# Patient Record
Sex: Female | Born: 1935 | Race: White | Hispanic: No | Smoking: Former smoker
Health system: Southern US, Community
[De-identification: ages and names within clinical notes are randomized; demographics above are authoritative.]

## PROBLEM LIST (undated history)

## (undated) DIAGNOSIS — K5730 Diverticulosis of large intestine without perforation or abscess without bleeding: Secondary | ICD-10-CM

## (undated) DIAGNOSIS — C50919 Malignant neoplasm of unspecified site of unspecified female breast: Secondary | ICD-10-CM

## (undated) DIAGNOSIS — C801 Malignant (primary) neoplasm, unspecified: Secondary | ICD-10-CM

## (undated) DIAGNOSIS — H3530 Unspecified macular degeneration: Secondary | ICD-10-CM

## (undated) DIAGNOSIS — I2510 Atherosclerotic heart disease of native coronary artery without angina pectoris: Secondary | ICD-10-CM

## (undated) HISTORY — DX: Atherosclerotic heart disease of native coronary artery without angina pectoris: I25.10

## (undated) HISTORY — DX: Malignant (primary) neoplasm, unspecified: C80.1

## (undated) HISTORY — DX: Unspecified macular degeneration: H35.30

## (undated) HISTORY — PX: ABDOMINAL HYSTERECTOMY: SHX81

## (undated) HISTORY — PX: MASTECTOMY: SHX3

## (undated) HISTORY — DX: Diverticulosis of large intestine without perforation or abscess without bleeding: K57.30

## (undated) HISTORY — DX: Malignant neoplasm of unspecified site of unspecified female breast: C50.919

## (undated) HISTORY — PX: OTHER SURGICAL HISTORY: SHX169

## (undated) HISTORY — PX: BREAST LUMPECTOMY: SHX2

---

## 1998-05-06 ENCOUNTER — Encounter: Payer: Self-pay | Admitting: Internal Medicine

## 1998-05-06 ENCOUNTER — Ambulatory Visit (HOSPITAL_COMMUNITY): Admission: RE | Admit: 1998-05-06 | Discharge: 1998-05-06 | Payer: Self-pay | Admitting: Internal Medicine

## 1998-05-24 ENCOUNTER — Encounter: Payer: Self-pay | Admitting: Emergency Medicine

## 1998-05-24 ENCOUNTER — Inpatient Hospital Stay (HOSPITAL_COMMUNITY): Admission: EM | Admit: 1998-05-24 | Discharge: 1998-05-27 | Payer: Self-pay | Admitting: Emergency Medicine

## 1998-05-25 ENCOUNTER — Encounter: Payer: Self-pay | Admitting: Internal Medicine

## 2002-02-06 ENCOUNTER — Ambulatory Visit (HOSPITAL_COMMUNITY): Admission: RE | Admit: 2002-02-06 | Discharge: 2002-02-06 | Payer: Self-pay | Admitting: Internal Medicine

## 2002-02-06 ENCOUNTER — Encounter (INDEPENDENT_AMBULATORY_CARE_PROVIDER_SITE_OTHER): Payer: Self-pay | Admitting: Specialist

## 2002-02-06 LAB — HM COLONOSCOPY

## 2002-08-21 ENCOUNTER — Other Ambulatory Visit: Admission: RE | Admit: 2002-08-21 | Discharge: 2002-08-21 | Payer: Self-pay | Admitting: Obstetrics and Gynecology

## 2002-09-17 ENCOUNTER — Encounter: Admission: RE | Admit: 2002-09-17 | Discharge: 2002-09-17 | Payer: Self-pay | Admitting: Obstetrics and Gynecology

## 2002-09-17 ENCOUNTER — Encounter (INDEPENDENT_AMBULATORY_CARE_PROVIDER_SITE_OTHER): Payer: Self-pay | Admitting: Radiology

## 2002-09-17 ENCOUNTER — Other Ambulatory Visit: Admission: RE | Admit: 2002-09-17 | Discharge: 2002-09-17 | Payer: Self-pay | Admitting: Radiology

## 2002-09-17 ENCOUNTER — Encounter: Payer: Self-pay | Admitting: Obstetrics and Gynecology

## 2002-10-15 ENCOUNTER — Ambulatory Visit (HOSPITAL_COMMUNITY): Admission: RE | Admit: 2002-10-15 | Discharge: 2002-10-15 | Payer: Self-pay | Admitting: Obstetrics and Gynecology

## 2002-10-15 ENCOUNTER — Encounter (INDEPENDENT_AMBULATORY_CARE_PROVIDER_SITE_OTHER): Payer: Self-pay | Admitting: *Deleted

## 2002-11-04 ENCOUNTER — Ambulatory Visit: Admission: RE | Admit: 2002-11-04 | Discharge: 2002-11-04 | Payer: Self-pay | Admitting: Gynecology

## 2002-11-24 ENCOUNTER — Ambulatory Visit: Admission: RE | Admit: 2002-11-24 | Discharge: 2003-01-28 | Payer: Self-pay | Admitting: Radiation Oncology

## 2003-02-25 ENCOUNTER — Ambulatory Visit: Admission: RE | Admit: 2003-02-25 | Discharge: 2003-02-25 | Payer: Self-pay | Admitting: Radiation Oncology

## 2003-05-26 ENCOUNTER — Encounter (INDEPENDENT_AMBULATORY_CARE_PROVIDER_SITE_OTHER): Payer: Self-pay | Admitting: Specialist

## 2003-05-26 ENCOUNTER — Ambulatory Visit (HOSPITAL_COMMUNITY): Admission: RE | Admit: 2003-05-26 | Discharge: 2003-05-26 | Payer: Self-pay | Admitting: Obstetrics and Gynecology

## 2003-08-12 ENCOUNTER — Ambulatory Visit: Admission: RE | Admit: 2003-08-12 | Discharge: 2003-08-12 | Payer: Self-pay | Admitting: Radiation Oncology

## 2003-08-17 ENCOUNTER — Ambulatory Visit (HOSPITAL_COMMUNITY): Admission: RE | Admit: 2003-08-17 | Discharge: 2003-08-17 | Payer: Self-pay | Admitting: Orthopedic Surgery

## 2004-02-07 ENCOUNTER — Ambulatory Visit (HOSPITAL_COMMUNITY): Admission: RE | Admit: 2004-02-07 | Discharge: 2004-02-07 | Payer: Self-pay | Admitting: Internal Medicine

## 2004-04-13 ENCOUNTER — Ambulatory Visit: Admission: RE | Admit: 2004-04-13 | Discharge: 2004-04-13 | Payer: Self-pay | Admitting: Radiation Oncology

## 2004-04-20 ENCOUNTER — Ambulatory Visit: Admission: RE | Admit: 2004-04-20 | Discharge: 2004-04-20 | Payer: Self-pay | Admitting: Radiation Oncology

## 2004-05-26 ENCOUNTER — Ambulatory Visit: Payer: Self-pay | Admitting: Internal Medicine

## 2004-10-02 ENCOUNTER — Ambulatory Visit: Payer: Self-pay | Admitting: Internal Medicine

## 2005-01-11 ENCOUNTER — Other Ambulatory Visit: Admission: RE | Admit: 2005-01-11 | Discharge: 2005-01-11 | Payer: Self-pay | Admitting: Obstetrics and Gynecology

## 2005-02-22 ENCOUNTER — Ambulatory Visit: Payer: Self-pay | Admitting: Internal Medicine

## 2005-02-28 ENCOUNTER — Encounter: Admission: RE | Admit: 2005-02-28 | Discharge: 2005-02-28 | Payer: Self-pay | Admitting: Internal Medicine

## 2005-03-02 ENCOUNTER — Ambulatory Visit: Payer: Self-pay | Admitting: Internal Medicine

## 2005-03-08 ENCOUNTER — Ambulatory Visit: Payer: Self-pay | Admitting: Internal Medicine

## 2005-03-08 ENCOUNTER — Ambulatory Visit (HOSPITAL_COMMUNITY): Admission: RE | Admit: 2005-03-08 | Discharge: 2005-03-08 | Payer: Self-pay | Admitting: Internal Medicine

## 2005-03-14 ENCOUNTER — Ambulatory Visit (HOSPITAL_COMMUNITY): Admission: RE | Admit: 2005-03-14 | Discharge: 2005-03-14 | Payer: Self-pay | Admitting: Internal Medicine

## 2005-03-19 ENCOUNTER — Ambulatory Visit: Payer: Self-pay | Admitting: Internal Medicine

## 2005-03-23 ENCOUNTER — Encounter: Payer: Self-pay | Admitting: Interventional Radiology

## 2005-03-27 ENCOUNTER — Ambulatory Visit (HOSPITAL_COMMUNITY): Admission: RE | Admit: 2005-03-27 | Discharge: 2005-03-27 | Payer: Self-pay | Admitting: Interventional Radiology

## 2005-03-27 ENCOUNTER — Encounter (INDEPENDENT_AMBULATORY_CARE_PROVIDER_SITE_OTHER): Payer: Self-pay | Admitting: *Deleted

## 2005-04-10 ENCOUNTER — Encounter: Payer: Self-pay | Admitting: Interventional Radiology

## 2005-04-20 ENCOUNTER — Ambulatory Visit: Payer: Self-pay | Admitting: Internal Medicine

## 2005-05-22 ENCOUNTER — Ambulatory Visit: Payer: Self-pay | Admitting: Internal Medicine

## 2005-05-30 ENCOUNTER — Ambulatory Visit (HOSPITAL_COMMUNITY): Admission: RE | Admit: 2005-05-30 | Discharge: 2005-05-30 | Payer: Self-pay | Admitting: Internal Medicine

## 2005-06-28 ENCOUNTER — Ambulatory Visit: Payer: Self-pay | Admitting: Internal Medicine

## 2005-07-09 ENCOUNTER — Encounter: Admission: RE | Admit: 2005-07-09 | Discharge: 2005-08-05 | Payer: Self-pay | Admitting: Internal Medicine

## 2005-07-23 ENCOUNTER — Ambulatory Visit: Payer: Self-pay | Admitting: Internal Medicine

## 2005-08-06 ENCOUNTER — Encounter: Admission: RE | Admit: 2005-08-06 | Discharge: 2005-09-07 | Payer: Self-pay | Admitting: Internal Medicine

## 2005-09-17 ENCOUNTER — Ambulatory Visit: Payer: Self-pay | Admitting: Internal Medicine

## 2005-10-19 ENCOUNTER — Ambulatory Visit: Payer: Self-pay | Admitting: Internal Medicine

## 2005-10-20 ENCOUNTER — Ambulatory Visit: Payer: Self-pay | Admitting: Internal Medicine

## 2006-02-08 ENCOUNTER — Ambulatory Visit: Payer: Self-pay | Admitting: Internal Medicine

## 2006-03-28 ENCOUNTER — Ambulatory Visit: Payer: Self-pay | Admitting: Oncology

## 2006-04-03 LAB — COMPREHENSIVE METABOLIC PANEL
Albumin: 4.5 g/dL (ref 3.5–5.2)
Alkaline Phosphatase: 69 U/L (ref 39–117)
BUN: 16 mg/dL (ref 6–23)
CO2: 24 mEq/L (ref 19–32)
Glucose, Bld: 97 mg/dL (ref 70–99)
Potassium: 4.2 mEq/L (ref 3.5–5.3)
Sodium: 146 mEq/L — ABNORMAL HIGH (ref 135–145)
Total Bilirubin: 0.4 mg/dL (ref 0.3–1.2)
Total Protein: 7.1 g/dL (ref 6.0–8.3)

## 2006-04-03 LAB — CBC WITH DIFFERENTIAL/PLATELET
Basophils Absolute: 0 10*3/uL (ref 0.0–0.1)
Eosinophils Absolute: 0.2 10*3/uL (ref 0.0–0.5)
HCT: 43.3 % (ref 34.8–46.6)
HGB: 14.4 g/dL (ref 11.6–15.9)
MONO#: 0.5 10*3/uL (ref 0.1–0.9)
NEUT#: 2.7 10*3/uL (ref 1.5–6.5)
NEUT%: 63.3 % (ref 39.6–76.8)
RDW: 14.3 % (ref 11.3–14.5)
WBC: 4.3 10*3/uL (ref 3.9–10.0)
lymph#: 0.9 10*3/uL (ref 0.9–3.3)

## 2006-04-03 LAB — LACTATE DEHYDROGENASE: LDH: 205 U/L (ref 94–250)

## 2006-04-03 LAB — CANCER ANTIGEN 27.29: CA 27.29: 14 U/mL (ref 0–39)

## 2006-04-12 ENCOUNTER — Ambulatory Visit: Payer: Self-pay | Admitting: Internal Medicine

## 2006-09-19 ENCOUNTER — Ambulatory Visit: Payer: Self-pay | Admitting: Oncology

## 2006-09-19 ENCOUNTER — Ambulatory Visit: Payer: Self-pay | Admitting: Internal Medicine

## 2006-09-24 ENCOUNTER — Ambulatory Visit (HOSPITAL_COMMUNITY): Admission: RE | Admit: 2006-09-24 | Discharge: 2006-09-24 | Payer: Self-pay | Admitting: Oncology

## 2006-09-24 LAB — COMPREHENSIVE METABOLIC PANEL
ALT: 14 U/L (ref 0–35)
AST: 25 U/L (ref 0–37)
Albumin: 4.5 g/dL (ref 3.5–5.2)
CO2: 26 mEq/L (ref 19–32)
Calcium: 10.2 mg/dL (ref 8.4–10.5)
Chloride: 106 mEq/L (ref 96–112)
Potassium: 4.2 mEq/L (ref 3.5–5.3)
Sodium: 143 mEq/L (ref 135–145)
Total Protein: 7.1 g/dL (ref 6.0–8.3)

## 2006-09-24 LAB — CBC WITH DIFFERENTIAL/PLATELET
BASO%: 0.3 % (ref 0.0–2.0)
EOS%: 3 % (ref 0.0–7.0)
MCH: 30.3 pg (ref 26.0–34.0)
MCHC: 34.8 g/dL (ref 32.0–36.0)
MONO#: 0.5 10*3/uL (ref 0.1–0.9)
RDW: 14.1 % (ref 11.3–14.5)
WBC: 4.7 10*3/uL (ref 3.9–10.0)
lymph#: 0.9 10*3/uL (ref 0.9–3.3)

## 2006-11-14 ENCOUNTER — Encounter: Payer: Self-pay | Admitting: Internal Medicine

## 2006-11-14 DIAGNOSIS — M1909 Primary osteoarthritis, other specified site: Secondary | ICD-10-CM | POA: Insufficient documentation

## 2006-11-14 DIAGNOSIS — C519 Malignant neoplasm of vulva, unspecified: Secondary | ICD-10-CM | POA: Insufficient documentation

## 2006-11-14 DIAGNOSIS — C4499 Other specified malignant neoplasm of skin, unspecified: Secondary | ICD-10-CM

## 2006-11-14 DIAGNOSIS — K219 Gastro-esophageal reflux disease without esophagitis: Secondary | ICD-10-CM | POA: Insufficient documentation

## 2006-11-14 DIAGNOSIS — M1991 Primary osteoarthritis, unspecified site: Secondary | ICD-10-CM

## 2006-11-25 ENCOUNTER — Ambulatory Visit: Payer: Self-pay | Admitting: Internal Medicine

## 2006-12-31 ENCOUNTER — Ambulatory Visit: Payer: Self-pay | Admitting: Internal Medicine

## 2006-12-31 ENCOUNTER — Encounter: Payer: Self-pay | Admitting: Internal Medicine

## 2006-12-31 DIAGNOSIS — I2510 Atherosclerotic heart disease of native coronary artery without angina pectoris: Secondary | ICD-10-CM

## 2006-12-31 DIAGNOSIS — K5730 Diverticulosis of large intestine without perforation or abscess without bleeding: Secondary | ICD-10-CM | POA: Insufficient documentation

## 2006-12-31 DIAGNOSIS — M810 Age-related osteoporosis without current pathological fracture: Secondary | ICD-10-CM | POA: Insufficient documentation

## 2007-03-23 ENCOUNTER — Ambulatory Visit: Payer: Self-pay | Admitting: Oncology

## 2007-03-26 ENCOUNTER — Encounter: Payer: Self-pay | Admitting: Internal Medicine

## 2007-07-11 ENCOUNTER — Ambulatory Visit: Payer: Self-pay | Admitting: Oncology

## 2007-07-17 ENCOUNTER — Ambulatory Visit (HOSPITAL_COMMUNITY): Admission: RE | Admit: 2007-07-17 | Discharge: 2007-07-17 | Payer: Self-pay | Admitting: Oncology

## 2007-07-23 ENCOUNTER — Encounter: Payer: Self-pay | Admitting: Internal Medicine

## 2007-07-24 ENCOUNTER — Ambulatory Visit: Admission: RE | Admit: 2007-07-24 | Discharge: 2007-09-11 | Payer: Self-pay | Admitting: Radiation Oncology

## 2007-07-25 ENCOUNTER — Encounter: Payer: Self-pay | Admitting: Internal Medicine

## 2007-08-27 ENCOUNTER — Ambulatory Visit: Payer: Self-pay | Admitting: Oncology

## 2007-08-27 LAB — BASIC METABOLIC PANEL
CO2: 24 mEq/L (ref 19–32)
Calcium: 10.3 mg/dL (ref 8.4–10.5)
Creatinine, Ser: 0.86 mg/dL (ref 0.40–1.20)
Sodium: 143 mEq/L (ref 135–145)

## 2007-09-08 ENCOUNTER — Encounter: Payer: Self-pay | Admitting: Internal Medicine

## 2007-10-14 ENCOUNTER — Ambulatory Visit: Payer: Self-pay | Admitting: Oncology

## 2007-10-15 ENCOUNTER — Encounter: Payer: Self-pay | Admitting: Internal Medicine

## 2007-10-15 LAB — CBC WITH DIFFERENTIAL/PLATELET
Eosinophils Absolute: 0.2 10*3/uL (ref 0.0–0.5)
HCT: 41.9 % (ref 34.8–46.6)
LYMPH%: 19.5 % (ref 14.0–48.0)
MONO#: 0.4 10*3/uL (ref 0.1–0.9)
NEUT#: 2.6 10*3/uL (ref 1.5–6.5)
Platelets: 176 10*3/uL (ref 145–400)
RBC: 4.69 10*6/uL (ref 3.70–5.32)
WBC: 4 10*3/uL (ref 3.9–10.0)
lymph#: 0.8 10*3/uL — ABNORMAL LOW (ref 0.9–3.3)

## 2007-10-15 LAB — COMPREHENSIVE METABOLIC PANEL
Albumin: 4.5 g/dL (ref 3.5–5.2)
CO2: 26 mEq/L (ref 19–32)
Calcium: 10.3 mg/dL (ref 8.4–10.5)
Chloride: 107 mEq/L (ref 96–112)
Glucose, Bld: 82 mg/dL (ref 70–99)
Sodium: 142 mEq/L (ref 135–145)
Total Bilirubin: 0.5 mg/dL (ref 0.3–1.2)
Total Protein: 7 g/dL (ref 6.0–8.3)

## 2007-10-15 LAB — CANCER ANTIGEN 27.29: CA 27.29: 22 U/mL (ref 0–39)

## 2007-10-16 ENCOUNTER — Ambulatory Visit (HOSPITAL_COMMUNITY): Admission: RE | Admit: 2007-10-16 | Discharge: 2007-10-16 | Payer: Self-pay | Admitting: Oncology

## 2007-10-23 ENCOUNTER — Encounter: Payer: Self-pay | Admitting: Internal Medicine

## 2007-12-05 ENCOUNTER — Ambulatory Visit: Payer: Self-pay | Admitting: Oncology

## 2007-12-05 LAB — URINALYSIS, MICROSCOPIC - CHCC
Glucose: NEGATIVE g/dL
Leukocyte Esterase: NEGATIVE
Nitrite: NEGATIVE
Specific Gravity, Urine: 1.005 (ref 1.003–1.035)
WBC, UA: NEGATIVE (ref 0–2)

## 2008-01-12 ENCOUNTER — Telehealth: Payer: Self-pay | Admitting: Internal Medicine

## 2008-01-13 ENCOUNTER — Ambulatory Visit: Payer: Self-pay | Admitting: Internal Medicine

## 2008-01-13 DIAGNOSIS — C50912 Malignant neoplasm of unspecified site of left female breast: Secondary | ICD-10-CM

## 2008-01-13 LAB — CONVERTED CEMR LAB
Bilirubin Urine: NEGATIVE
Hemoglobin, Urine: NEGATIVE
Ketones, ur: NEGATIVE mg/dL
Leukocytes, UA: NEGATIVE
Nitrite: NEGATIVE
Specific Gravity, Urine: <=1.005 (ref 1.000–1.03)
Total Protein, Urine: NEGATIVE mg/dL
Urine Glucose: NEGATIVE mg/dL
Urobilinogen, UA: 0.2 (ref 0.0–1.0)
pH: 7 (ref 5.0–8.0)

## 2008-01-17 ENCOUNTER — Telehealth (INDEPENDENT_AMBULATORY_CARE_PROVIDER_SITE_OTHER): Payer: Self-pay | Admitting: *Deleted

## 2008-01-17 ENCOUNTER — Ambulatory Visit: Payer: Self-pay | Admitting: Family Medicine

## 2008-01-19 ENCOUNTER — Ambulatory Visit: Payer: Self-pay | Admitting: Internal Medicine

## 2008-01-19 LAB — CONVERTED CEMR LAB
ALT: 17 units/L (ref 0–35)
AST: 31 units/L (ref 0–37)
Albumin: 4.5 g/dL (ref 3.5–5.2)
Alkaline Phosphatase: 54 units/L (ref 39–117)
BUN: 12 mg/dL (ref 6–23)
Basophils Absolute: 0 10*3/uL (ref 0.0–0.1)
Basophils Relative: 1 % (ref 0.0–3.0)
Bilirubin, Direct: 0.1 mg/dL (ref 0.0–0.3)
CO2: 29 meq/L (ref 19–32)
Calcium: 10.1 mg/dL (ref 8.4–10.5)
Chloride: 113 meq/L — ABNORMAL HIGH (ref 96–112)
Creatinine, Ser: 0.9 mg/dL (ref 0.4–1.2)
Eosinophils Absolute: 0.2 10*3/uL (ref 0.0–0.7)
Eosinophils Relative: 3.9 % (ref 0.0–5.0)
GFR calc Af Amer: 75 mL/min
GFR calc non Af Amer: 62 mL/min
Glucose, Bld: 86 mg/dL (ref 70–99)
HCT: 43.8 % (ref 36.0–46.0)
Hemoglobin: 15.2 g/dL — ABNORMAL HIGH (ref 12.0–15.0)
Lymphocytes Relative: 19.3 % (ref 12.0–46.0)
MCHC: 34.7 g/dL (ref 30.0–36.0)
MCV: 91.7 fL (ref 78.0–100.0)
Monocytes Absolute: 0.4 10*3/uL (ref 0.1–1.0)
Monocytes Relative: 10.2 % (ref 3.0–12.0)
Neutro Abs: 2.7 10*3/uL (ref 1.4–7.7)
Neutrophils Relative %: 65.6 % (ref 43.0–77.0)
Platelets: 155 10*3/uL (ref 150–400)
Potassium: 4.4 meq/L (ref 3.5–5.1)
RBC: 4.77 M/uL (ref 3.87–5.11)
RDW: 13.4 % (ref 11.5–14.6)
Sodium: 145 meq/L (ref 135–145)
Total Bilirubin: 0.9 mg/dL (ref 0.3–1.2)
Total Protein: 7.9 g/dL (ref 6.0–8.3)
WBC: 4.1 10*3/uL — ABNORMAL LOW (ref 4.5–10.5)

## 2008-01-20 ENCOUNTER — Telehealth: Payer: Self-pay | Admitting: Internal Medicine

## 2008-01-20 ENCOUNTER — Ambulatory Visit: Payer: Self-pay | Admitting: Oncology

## 2008-01-21 ENCOUNTER — Telehealth: Payer: Self-pay | Admitting: Internal Medicine

## 2008-02-26 ENCOUNTER — Ambulatory Visit (HOSPITAL_COMMUNITY): Admission: RE | Admit: 2008-02-26 | Discharge: 2008-02-26 | Payer: Self-pay | Admitting: Oncology

## 2008-02-26 LAB — CBC WITH DIFFERENTIAL/PLATELET
Eosinophils Absolute: 0.2 10*3/uL (ref 0.0–0.5)
HCT: 43 % (ref 34.8–46.6)
HGB: 14.5 g/dL (ref 11.6–15.9)
LYMPH%: 17.7 % (ref 14.0–48.0)
MONO#: 0.4 10*3/uL (ref 0.1–0.9)
NEUT#: 2.6 10*3/uL (ref 1.5–6.5)
Platelets: 204 10*3/uL (ref 145–400)
RBC: 4.72 10*6/uL (ref 3.70–5.32)
WBC: 3.9 10*3/uL (ref 3.9–10.0)

## 2008-02-26 LAB — COMPREHENSIVE METABOLIC PANEL
Albumin: 3.8 g/dL (ref 3.5–5.2)
CO2: 29 mEq/L (ref 19–32)
Glucose, Bld: 96 mg/dL (ref 70–99)
Sodium: 142 mEq/L (ref 135–145)
Total Bilirubin: 0.5 mg/dL (ref 0.3–1.2)
Total Protein: 6.7 g/dL (ref 6.0–8.3)

## 2008-02-26 LAB — CANCER ANTIGEN 27.29: CA 27.29: 18 U/mL (ref 0–39)

## 2008-03-10 ENCOUNTER — Ambulatory Visit: Payer: Self-pay | Admitting: Oncology

## 2008-03-17 ENCOUNTER — Encounter: Payer: Self-pay | Admitting: Internal Medicine

## 2008-03-17 LAB — CBC WITH DIFFERENTIAL/PLATELET
Eosinophils Absolute: 0.2 10*3/uL (ref 0.0–0.5)
MONO#: 0.5 10*3/uL (ref 0.1–0.9)
NEUT#: 3 10*3/uL (ref 1.5–6.5)
RBC: 4.72 10*6/uL (ref 3.70–5.32)
RDW: 12.8 % (ref 11.3–14.5)
WBC: 4.6 10*3/uL (ref 3.9–10.0)
lymph#: 0.9 10*3/uL (ref 0.9–3.3)

## 2008-03-30 ENCOUNTER — Encounter: Payer: Self-pay | Admitting: Internal Medicine

## 2008-07-08 ENCOUNTER — Ambulatory Visit: Payer: Self-pay | Admitting: Oncology

## 2008-07-12 LAB — COMPREHENSIVE METABOLIC PANEL
AST: 26 U/L (ref 0–37)
Albumin: 4.3 g/dL (ref 3.5–5.2)
Alkaline Phosphatase: 51 U/L (ref 39–117)
BUN: 16 mg/dL (ref 6–23)
Creatinine, Ser: 0.86 mg/dL (ref 0.40–1.20)
Potassium: 3.8 mEq/L (ref 3.5–5.3)

## 2008-07-12 LAB — CBC WITH DIFFERENTIAL/PLATELET
Basophils Absolute: 0 10*3/uL (ref 0.0–0.1)
EOS%: 4 % (ref 0.0–7.0)
HGB: 14.4 g/dL (ref 11.6–15.9)
MCH: 30.5 pg (ref 25.1–34.0)
MCV: 90.5 fL (ref 79.5–101.0)
MONO%: 8.5 % (ref 0.0–14.0)
RBC: 4.73 10*6/uL (ref 3.70–5.45)
RDW: 13.6 % (ref 11.2–14.5)

## 2008-07-19 ENCOUNTER — Encounter: Payer: Self-pay | Admitting: Internal Medicine

## 2008-09-03 ENCOUNTER — Telehealth (INDEPENDENT_AMBULATORY_CARE_PROVIDER_SITE_OTHER): Payer: Self-pay | Admitting: *Deleted

## 2008-09-06 ENCOUNTER — Ambulatory Visit: Payer: Self-pay | Admitting: Internal Medicine

## 2008-09-06 DIAGNOSIS — M79609 Pain in unspecified limb: Secondary | ICD-10-CM | POA: Insufficient documentation

## 2008-09-07 ENCOUNTER — Ambulatory Visit: Payer: Self-pay | Admitting: Internal Medicine

## 2008-09-22 ENCOUNTER — Ambulatory Visit: Payer: Self-pay | Admitting: Endocrinology

## 2008-10-11 ENCOUNTER — Encounter: Payer: Self-pay | Admitting: Internal Medicine

## 2008-10-29 LAB — HM MAMMOGRAPHY: HM Mammogram: NORMAL

## 2008-11-29 ENCOUNTER — Encounter: Payer: Self-pay | Admitting: Internal Medicine

## 2009-01-06 ENCOUNTER — Ambulatory Visit: Payer: Self-pay | Admitting: Oncology

## 2009-01-10 LAB — COMPREHENSIVE METABOLIC PANEL
ALT: 11 U/L (ref 0–35)
AST: 24 U/L (ref 0–37)
Albumin: 4.2 g/dL (ref 3.5–5.2)
Calcium: 10 mg/dL (ref 8.4–10.5)
Chloride: 107 mEq/L (ref 96–112)
Potassium: 4 mEq/L (ref 3.5–5.3)
Sodium: 143 mEq/L (ref 135–145)
Total Protein: 6.7 g/dL (ref 6.0–8.3)

## 2009-01-10 LAB — CBC WITH DIFFERENTIAL/PLATELET
BASO%: 0.4 % (ref 0.0–2.0)
EOS%: 4.7 % (ref 0.0–7.0)
MCH: 30.9 pg (ref 25.1–34.0)
MCHC: 34.2 g/dL (ref 31.5–36.0)
RDW: 14.3 % (ref 11.2–14.5)
WBC: 4.6 10*3/uL (ref 3.9–10.3)
lymph#: 1 10*3/uL (ref 0.9–3.3)

## 2009-01-17 ENCOUNTER — Encounter: Payer: Self-pay | Admitting: Internal Medicine

## 2009-02-24 ENCOUNTER — Ambulatory Visit: Payer: Self-pay | Admitting: Internal Medicine

## 2009-03-04 ENCOUNTER — Telehealth: Payer: Self-pay | Admitting: Internal Medicine

## 2009-03-15 ENCOUNTER — Encounter: Payer: Self-pay | Admitting: Internal Medicine

## 2009-04-01 ENCOUNTER — Ambulatory Visit: Payer: Self-pay | Admitting: Oncology

## 2009-04-05 ENCOUNTER — Ambulatory Visit: Admission: RE | Admit: 2009-04-05 | Discharge: 2009-04-29 | Payer: Self-pay | Admitting: Radiation Oncology

## 2009-04-05 ENCOUNTER — Ambulatory Visit (HOSPITAL_COMMUNITY): Admission: RE | Admit: 2009-04-05 | Discharge: 2009-04-05 | Payer: Self-pay | Admitting: Oncology

## 2009-04-05 LAB — COMPREHENSIVE METABOLIC PANEL
ALT: 17 U/L (ref 0–35)
Alkaline Phosphatase: 63 U/L (ref 39–117)
BUN: 18 mg/dL (ref 6–23)
CO2: 29 mEq/L (ref 19–32)
Chloride: 105 mEq/L (ref 96–112)
Creatinine, Ser: 0.95 mg/dL (ref 0.40–1.20)
Glucose, Bld: 91 mg/dL (ref 70–99)
Potassium: 4 mEq/L (ref 3.5–5.3)
Total Bilirubin: 0.9 mg/dL (ref 0.3–1.2)
Total Protein: 7.4 g/dL (ref 6.0–8.3)

## 2009-04-05 LAB — CBC WITH DIFFERENTIAL/PLATELET
Basophils Absolute: 0 10*3/uL (ref 0.0–0.1)
EOS%: 3.5 % (ref 0.0–7.0)
Eosinophils Absolute: 0.2 10*3/uL (ref 0.0–0.5)
HCT: 45 % (ref 34.8–46.6)
HGB: 15.2 g/dL (ref 11.6–15.9)
MCH: 31.5 pg (ref 25.1–34.0)
MONO#: 0.5 10*3/uL (ref 0.1–0.9)
NEUT#: 3.3 10*3/uL (ref 1.5–6.5)
NEUT%: 69.7 % (ref 38.4–76.8)
RDW: 13.4 % (ref 11.2–14.5)
lymph#: 0.8 10*3/uL — ABNORMAL LOW (ref 0.9–3.3)

## 2009-04-06 ENCOUNTER — Encounter: Payer: Self-pay | Admitting: Internal Medicine

## 2009-04-06 LAB — VITAMIN D 25 HYDROXY (VIT D DEFICIENCY, FRACTURES): Vit D, 25-Hydroxy: 52 ng/mL (ref 30–89)

## 2009-04-07 ENCOUNTER — Telehealth: Payer: Self-pay | Admitting: Internal Medicine

## 2009-04-07 ENCOUNTER — Ambulatory Visit: Payer: Self-pay | Admitting: Internal Medicine

## 2009-04-07 DIAGNOSIS — F438 Other reactions to severe stress: Secondary | ICD-10-CM

## 2009-04-07 DIAGNOSIS — I10 Essential (primary) hypertension: Secondary | ICD-10-CM | POA: Insufficient documentation

## 2009-04-07 DIAGNOSIS — F4389 Other reactions to severe stress: Secondary | ICD-10-CM | POA: Insufficient documentation

## 2009-04-11 ENCOUNTER — Encounter: Payer: Self-pay | Admitting: Internal Medicine

## 2009-04-27 ENCOUNTER — Encounter: Payer: Self-pay | Admitting: Oncology

## 2009-04-27 ENCOUNTER — Ambulatory Visit: Payer: Self-pay | Admitting: Cardiovascular Disease

## 2009-04-27 ENCOUNTER — Ambulatory Visit: Admission: RE | Admit: 2009-04-27 | Discharge: 2009-04-27 | Payer: Self-pay | Admitting: Family Medicine

## 2009-05-02 ENCOUNTER — Other Ambulatory Visit: Admission: RE | Admit: 2009-05-02 | Discharge: 2009-05-02 | Payer: Self-pay | Admitting: General Surgery

## 2009-05-02 ENCOUNTER — Ambulatory Visit: Admission: RE | Admit: 2009-05-02 | Discharge: 2009-07-14 | Payer: Self-pay | Admitting: Radiation Oncology

## 2009-05-02 ENCOUNTER — Ambulatory Visit: Payer: Self-pay | Admitting: Oncology

## 2009-05-02 ENCOUNTER — Encounter: Payer: Self-pay | Admitting: Internal Medicine

## 2009-05-02 LAB — CBC WITH DIFFERENTIAL/PLATELET
BASO%: 0.6 % (ref 0.0–2.0)
Basophils Absolute: 0 10*3/uL (ref 0.0–0.1)
EOS%: 4.5 % (ref 0.0–7.0)
Eosinophils Absolute: 0.2 10*3/uL (ref 0.0–0.5)
HCT: 43.6 % (ref 34.8–46.6)
HGB: 14.7 g/dL (ref 11.6–15.9)
LYMPH%: 16.8 % (ref 14.0–49.7)
MCH: 31.3 pg (ref 25.1–34.0)
MCHC: 33.7 g/dL (ref 31.5–36.0)
MCV: 92.8 fL (ref 79.5–101.0)
MONO#: 0.5 10*3/uL (ref 0.1–0.9)
MONO%: 10.3 % (ref 0.0–14.0)
NEUT#: 3.4 10*3/uL (ref 1.5–6.5)
NEUT%: 67.8 % (ref 38.4–76.8)
Platelets: 150 10*3/uL (ref 145–400)
RBC: 4.69 10*6/uL (ref 3.70–5.45)
RDW: 14.2 % (ref 11.2–14.5)
WBC: 5 10*3/uL (ref 3.9–10.3)
lymph#: 0.8 10*3/uL — ABNORMAL LOW (ref 0.9–3.3)

## 2009-05-02 LAB — COMPREHENSIVE METABOLIC PANEL
ALT: 14 U/L (ref 0–35)
AST: 26 U/L (ref 0–37)
Albumin: 4.5 g/dL (ref 3.5–5.2)
Alkaline Phosphatase: 57 U/L (ref 39–117)
BUN: 19 mg/dL (ref 6–23)
Potassium: 4.2 mEq/L (ref 3.5–5.3)
Sodium: 142 mEq/L (ref 135–145)
Total Protein: 6.7 g/dL (ref 6.0–8.3)

## 2009-05-04 ENCOUNTER — Telehealth: Payer: Self-pay | Admitting: Internal Medicine

## 2009-05-11 ENCOUNTER — Encounter: Payer: Self-pay | Admitting: Internal Medicine

## 2009-05-12 ENCOUNTER — Encounter: Payer: Self-pay | Admitting: Internal Medicine

## 2009-05-18 LAB — BASIC METABOLIC PANEL
BUN: 20 mg/dL (ref 6–23)
Creatinine, Ser: 0.96 mg/dL (ref 0.40–1.20)
Glucose, Bld: 89 mg/dL (ref 70–99)
Potassium: 4.2 mEq/L (ref 3.5–5.3)

## 2009-06-02 ENCOUNTER — Telehealth: Payer: Self-pay | Admitting: Internal Medicine

## 2009-06-07 ENCOUNTER — Ambulatory Visit: Payer: Self-pay | Admitting: Oncology

## 2009-06-14 LAB — COMPREHENSIVE METABOLIC PANEL
Albumin: 4.2 g/dL (ref 3.5–5.2)
Alkaline Phosphatase: 44 U/L (ref 39–117)
Glucose, Bld: 124 mg/dL — ABNORMAL HIGH (ref 70–99)
Potassium: 4.3 mEq/L (ref 3.5–5.3)
Sodium: 142 mEq/L (ref 135–145)
Total Protein: 7.1 g/dL (ref 6.0–8.3)

## 2009-06-14 LAB — CBC WITH DIFFERENTIAL/PLATELET
Eosinophils Absolute: 0.3 10*3/uL (ref 0.0–0.5)
MCV: 91.7 fL (ref 79.5–101.0)
MONO#: 0.4 10*3/uL (ref 0.1–0.9)
MONO%: 9.3 % (ref 0.0–14.0)
NEUT#: 3 10*3/uL (ref 1.5–6.5)
RBC: 4.69 10*6/uL (ref 3.70–5.45)
RDW: 14.3 % (ref 11.2–14.5)
WBC: 4.7 10*3/uL (ref 3.9–10.3)
lymph#: 0.9 10*3/uL (ref 0.9–3.3)

## 2009-06-20 ENCOUNTER — Encounter: Payer: Self-pay | Admitting: Internal Medicine

## 2009-06-24 ENCOUNTER — Telehealth: Payer: Self-pay | Admitting: Internal Medicine

## 2009-06-27 ENCOUNTER — Telehealth: Payer: Self-pay | Admitting: Internal Medicine

## 2009-06-29 ENCOUNTER — Encounter: Payer: Self-pay | Admitting: Internal Medicine

## 2009-06-29 LAB — CBC WITH DIFFERENTIAL/PLATELET
BASO%: 0.9 % (ref 0.0–2.0)
LYMPH%: 24 % (ref 14.0–49.7)
MCHC: 32.6 g/dL (ref 31.5–36.0)
MCV: 92.5 fL (ref 79.5–101.0)
MONO#: 0.6 10*3/uL (ref 0.1–0.9)
MONO%: 10.3 % (ref 0.0–14.0)
Platelets: 145 10*3/uL (ref 145–400)
RBC: 4.64 10*6/uL (ref 3.70–5.45)
RDW: 14.4 % (ref 11.2–14.5)
WBC: 5.5 10*3/uL (ref 3.9–10.3)
nRBC: 0 % (ref 0–0)

## 2009-07-13 ENCOUNTER — Encounter: Payer: Self-pay | Admitting: Oncology

## 2009-07-13 ENCOUNTER — Ambulatory Visit: Payer: Self-pay | Admitting: Cardiology

## 2009-07-13 ENCOUNTER — Ambulatory Visit: Admission: RE | Admit: 2009-07-13 | Discharge: 2009-07-13 | Payer: Self-pay | Admitting: Oncology

## 2009-07-18 ENCOUNTER — Ambulatory Visit: Payer: Self-pay | Admitting: Oncology

## 2009-07-20 ENCOUNTER — Encounter: Payer: Self-pay | Admitting: Internal Medicine

## 2009-07-20 LAB — CBC WITH DIFFERENTIAL/PLATELET
BASO%: 0.4 % (ref 0.0–2.0)
EOS%: 5 % (ref 0.0–7.0)
MCH: 30.1 pg (ref 25.1–34.0)
MCHC: 32.7 g/dL (ref 31.5–36.0)
RDW: 14.3 % (ref 11.2–14.5)
WBC: 4.8 10*3/uL (ref 3.9–10.3)
lymph#: 1.3 10*3/uL (ref 0.9–3.3)
nRBC: 0 % (ref 0–0)

## 2009-08-10 ENCOUNTER — Encounter: Payer: Self-pay | Admitting: Internal Medicine

## 2009-08-10 LAB — CBC WITH DIFFERENTIAL/PLATELET
BASO%: 0.9 % (ref 0.0–2.0)
Basophils Absolute: 0.1 10*3/uL (ref 0.0–0.1)
EOS%: 6.6 % (ref 0.0–7.0)
HGB: 14.5 g/dL (ref 11.6–15.9)
MCH: 30.3 pg (ref 25.1–34.0)
MCHC: 32.9 g/dL (ref 31.5–36.0)
MCV: 92.3 fL (ref 79.5–101.0)
MONO%: 13.1 % (ref 0.0–14.0)
RDW: 14.7 % — ABNORMAL HIGH (ref 11.2–14.5)
lymph#: 1.2 10*3/uL (ref 0.9–3.3)

## 2009-08-10 LAB — BASIC METABOLIC PANEL
BUN: 23 mg/dL (ref 6–23)
CO2: 28 mEq/L (ref 19–32)
Calcium: 11 mg/dL — ABNORMAL HIGH (ref 8.4–10.5)
Chloride: 101 mEq/L (ref 96–112)
Creatinine, Ser: 1.17 mg/dL (ref 0.40–1.20)
Glucose, Bld: 83 mg/dL (ref 70–99)

## 2009-08-30 ENCOUNTER — Ambulatory Visit: Payer: Self-pay | Admitting: Oncology

## 2009-09-23 ENCOUNTER — Ambulatory Visit: Payer: Self-pay | Admitting: Internal Medicine

## 2009-10-03 ENCOUNTER — Ambulatory Visit: Payer: Self-pay | Admitting: Oncology

## 2009-10-03 LAB — URINALYSIS, MICROSCOPIC - CHCC
Bilirubin (Urine): NEGATIVE
Glucose: NEGATIVE g/dL
Ketones: NEGATIVE mg/dL
pH: 6 (ref 4.6–8.0)

## 2009-10-03 LAB — CBC WITH DIFFERENTIAL/PLATELET
BASO%: 0.6 % (ref 0.0–2.0)
Basophils Absolute: 0 10*3/uL (ref 0.0–0.1)
HCT: 33.3 % — ABNORMAL LOW (ref 34.8–46.6)
HGB: 11.2 g/dL — ABNORMAL LOW (ref 11.6–15.9)
MONO#: 0.5 10*3/uL (ref 0.1–0.9)
NEUT#: 3.2 10*3/uL (ref 1.5–6.5)
NEUT%: 65.7 % (ref 38.4–76.8)
WBC: 4.9 10*3/uL (ref 3.9–10.3)
lymph#: 1 10*3/uL (ref 0.9–3.3)

## 2009-10-05 ENCOUNTER — Ambulatory Visit: Admission: RE | Admit: 2009-10-05 | Discharge: 2009-10-05 | Payer: Self-pay | Admitting: Oncology

## 2009-10-05 ENCOUNTER — Encounter: Payer: Self-pay | Admitting: Oncology

## 2009-10-05 ENCOUNTER — Ambulatory Visit: Payer: Self-pay | Admitting: Cardiovascular Disease

## 2009-10-12 LAB — CBC WITH DIFFERENTIAL/PLATELET
BASO%: 0.6 % (ref 0.0–2.0)
Basophils Absolute: 0 10*3/uL (ref 0.0–0.1)
Eosinophils Absolute: 0.3 10*3/uL (ref 0.0–0.5)
HCT: 37.8 % (ref 34.8–46.6)
HGB: 12 g/dL (ref 11.6–15.9)
LYMPH%: 21.2 % (ref 14.0–49.7)
MCHC: 31.7 g/dL (ref 31.5–36.0)
MONO#: 0.5 10*3/uL (ref 0.1–0.9)
NEUT%: 63 % (ref 38.4–76.8)
Platelets: 212 10*3/uL (ref 145–400)
WBC: 5 10*3/uL (ref 3.9–10.3)

## 2009-10-12 LAB — BASIC METABOLIC PANEL
Glucose, Bld: 87 mg/dL (ref 70–99)
Potassium: 3.7 mEq/L (ref 3.5–5.3)
Sodium: 142 mEq/L (ref 135–145)

## 2009-11-02 ENCOUNTER — Ambulatory Visit: Payer: Self-pay | Admitting: Oncology

## 2009-11-23 ENCOUNTER — Encounter: Payer: Self-pay | Admitting: Internal Medicine

## 2009-11-23 LAB — CBC WITH DIFFERENTIAL/PLATELET
BASO%: 0.7 % (ref 0.0–2.0)
Eosinophils Absolute: 0.3 10*3/uL (ref 0.0–0.5)
HCT: 40.5 % (ref 34.8–46.6)
LYMPH%: 24.3 % (ref 14.0–49.7)
MCHC: 31.6 g/dL (ref 31.5–36.0)
MONO#: 0.5 10*3/uL (ref 0.1–0.9)
NEUT%: 57.4 % (ref 38.4–76.8)
Platelets: 158 10*3/uL (ref 145–400)
WBC: 4 10*3/uL (ref 3.9–10.3)

## 2009-11-23 LAB — COMPREHENSIVE METABOLIC PANEL
BUN: 16 mg/dL (ref 6–23)
CO2: 23 mEq/L (ref 19–32)
Creatinine, Ser: 0.94 mg/dL (ref 0.40–1.20)
Glucose, Bld: 92 mg/dL (ref 70–99)
Total Bilirubin: 0.4 mg/dL (ref 0.3–1.2)

## 2009-11-23 LAB — CANCER ANTIGEN 27.29: CA 27.29: 16 U/mL (ref 0–39)

## 2009-12-12 ENCOUNTER — Ambulatory Visit: Payer: Self-pay | Admitting: Oncology

## 2009-12-14 LAB — CBC WITH DIFFERENTIAL/PLATELET
Eosinophils Absolute: 0.3 10*3/uL (ref 0.0–0.5)
MCV: 87 fL (ref 79.5–101.0)
MONO#: 0.4 10*3/uL (ref 0.1–0.9)
MONO%: 10.6 % (ref 0.0–14.0)
NEUT#: 2.4 10*3/uL (ref 1.5–6.5)
RBC: 4.5 10*6/uL (ref 3.70–5.45)
RDW: 16.2 % — ABNORMAL HIGH (ref 11.2–14.5)
WBC: 4.2 10*3/uL (ref 3.9–10.3)
lymph#: 1 10*3/uL (ref 0.9–3.3)

## 2009-12-14 LAB — BASIC METABOLIC PANEL
Glucose, Bld: 89 mg/dL (ref 70–99)
Potassium: 3.9 mEq/L (ref 3.5–5.3)
Sodium: 142 mEq/L (ref 135–145)

## 2010-01-12 ENCOUNTER — Ambulatory Visit: Admission: RE | Admit: 2010-01-12 | Discharge: 2010-01-12 | Payer: Self-pay | Admitting: Oncology

## 2010-01-12 ENCOUNTER — Ambulatory Visit: Payer: Self-pay | Admitting: Oncology

## 2010-01-12 ENCOUNTER — Ambulatory Visit: Payer: Self-pay | Admitting: Cardiology

## 2010-01-12 LAB — COMPREHENSIVE METABOLIC PANEL
ALT: 22 U/L (ref 0–35)
AST: 37 U/L (ref 0–37)
Albumin: 3.8 g/dL (ref 3.5–5.2)
Calcium: 9.6 mg/dL (ref 8.4–10.5)
Chloride: 111 mEq/L (ref 96–112)
Potassium: 3.2 mEq/L — ABNORMAL LOW (ref 3.5–5.3)

## 2010-01-12 LAB — CBC WITH DIFFERENTIAL/PLATELET
BASO%: 0.6 % (ref 0.0–2.0)
EOS%: 4.7 % (ref 0.0–7.0)
HGB: 9.8 g/dL — ABNORMAL LOW (ref 11.6–15.9)
MCH: 29 pg (ref 25.1–34.0)
MCHC: 33.6 g/dL (ref 31.5–36.0)
RDW: 18.5 % — ABNORMAL HIGH (ref 11.2–14.5)
lymph#: 1 10*3/uL (ref 0.9–3.3)

## 2010-01-13 ENCOUNTER — Ambulatory Visit: Payer: Self-pay | Admitting: Internal Medicine

## 2010-01-13 DIAGNOSIS — D630 Anemia in neoplastic disease: Secondary | ICD-10-CM

## 2010-01-13 LAB — CONVERTED CEMR LAB
Basophils Absolute: 0 10*3/uL (ref 0.0–0.1)
Basophils Relative: 0.7 % (ref 0.0–3.0)
Eosinophils Absolute: 0.2 10*3/uL (ref 0.0–0.7)
Eosinophils Relative: 5.6 % — ABNORMAL HIGH (ref 0.0–5.0)
HCT: 31.5 % — ABNORMAL LOW (ref 36.0–46.0)
Hemoglobin: 10.7 g/dL — ABNORMAL LOW (ref 12.0–15.0)
Iron: 52 ug/dL (ref 42–145)
Lymphocytes Relative: 26.8 % (ref 12.0–46.0)
Lymphs Abs: 1 10*3/uL (ref 0.7–4.0)
MCHC: 33.8 g/dL (ref 30.0–36.0)
MCV: 87.1 fL (ref 78.0–100.0)
Monocytes Absolute: 0.4 10*3/uL (ref 0.1–1.0)
Monocytes Relative: 11.6 % (ref 3.0–12.0)
Neutro Abs: 2.1 10*3/uL (ref 1.4–7.7)
Neutrophils Relative %: 55.3 % (ref 43.0–77.0)
Platelets: 162 10*3/uL (ref 150.0–400.0)
RBC: 3.62 M/uL — ABNORMAL LOW (ref 3.87–5.11)
RDW: 18.5 % — ABNORMAL HIGH (ref 11.5–14.6)
Retic Ct Pct: 2 % (ref 0.4–3.1)
Saturation Ratios: 13.9 % — ABNORMAL LOW (ref 20.0–50.0)
Transferrin: 267.4 mg/dL (ref 212.0–360.0)
WBC: 3.8 10*3/uL — ABNORMAL LOW (ref 4.5–10.5)

## 2010-01-17 ENCOUNTER — Encounter: Payer: Self-pay | Admitting: Internal Medicine

## 2010-01-17 LAB — CBC WITH DIFFERENTIAL/PLATELET
Basophils Absolute: 0 10*3/uL (ref 0.0–0.1)
EOS%: 3.7 % (ref 0.0–7.0)
HGB: 11 g/dL — ABNORMAL LOW (ref 11.6–15.9)
MCH: 28.1 pg (ref 25.1–34.0)
MCV: 89.8 fL (ref 79.5–101.0)
MONO%: 13.7 % (ref 0.0–14.0)
RBC: 3.91 10*6/uL (ref 3.70–5.45)
RDW: 17.4 % — ABNORMAL HIGH (ref 11.2–14.5)

## 2010-01-19 ENCOUNTER — Telehealth: Payer: Self-pay | Admitting: Internal Medicine

## 2010-01-20 ENCOUNTER — Encounter: Payer: Self-pay | Admitting: Internal Medicine

## 2010-01-24 ENCOUNTER — Ambulatory Visit: Payer: Self-pay | Admitting: Internal Medicine

## 2010-01-24 DIAGNOSIS — R252 Cramp and spasm: Secondary | ICD-10-CM | POA: Insufficient documentation

## 2010-01-24 LAB — CONVERTED CEMR LAB
Hemoglobin: 11.2 g/dL — ABNORMAL LOW (ref 12.0–15.0)
Magnesium: 2.5 mg/dL (ref 1.5–2.5)
Potassium: 4.3 meq/L (ref 3.5–5.1)

## 2010-01-25 ENCOUNTER — Telehealth: Payer: Self-pay | Admitting: Internal Medicine

## 2010-01-25 ENCOUNTER — Encounter: Payer: Self-pay | Admitting: Internal Medicine

## 2010-01-25 LAB — CBC WITH DIFFERENTIAL/PLATELET
BASO%: 0.4 % (ref 0.0–2.0)
EOS%: 4.5 % (ref 0.0–7.0)
Eosinophils Absolute: 0.2 10*3/uL (ref 0.0–0.5)
MCHC: 31.5 g/dL (ref 31.5–36.0)
MCV: 90.4 fL (ref 79.5–101.0)
MONO%: 12.6 % (ref 0.0–14.0)
NEUT#: 3.1 10*3/uL (ref 1.5–6.5)
RBC: 3.94 10*6/uL (ref 3.70–5.45)
RDW: 18.1 % — ABNORMAL HIGH (ref 11.2–14.5)
nRBC: 0 % (ref 0–0)

## 2010-01-25 LAB — COMPREHENSIVE METABOLIC PANEL
ALT: 18 U/L (ref 0–35)
AST: 32 U/L (ref 0–37)
Alkaline Phosphatase: 35 U/L — ABNORMAL LOW (ref 39–117)
CO2: 27 mEq/L (ref 19–32)
Sodium: 140 mEq/L (ref 135–145)
Total Bilirubin: 0.7 mg/dL (ref 0.3–1.2)
Total Protein: 6.3 g/dL (ref 6.0–8.3)

## 2010-02-13 ENCOUNTER — Ambulatory Visit: Payer: Self-pay | Admitting: Oncology

## 2010-02-23 ENCOUNTER — Ambulatory Visit: Payer: Self-pay | Admitting: Internal Medicine

## 2010-03-08 ENCOUNTER — Encounter: Payer: Self-pay | Admitting: Internal Medicine

## 2010-03-08 LAB — COMPREHENSIVE METABOLIC PANEL
CO2: 26 mEq/L (ref 19–32)
Calcium: 10.2 mg/dL (ref 8.4–10.5)
Chloride: 107 mEq/L (ref 96–112)
Glucose, Bld: 95 mg/dL (ref 70–99)
Sodium: 141 mEq/L (ref 135–145)
Total Bilirubin: 0.8 mg/dL (ref 0.3–1.2)
Total Protein: 7.1 g/dL (ref 6.0–8.3)

## 2010-03-08 LAB — CBC & DIFF AND RETIC
BASO%: 0.8 % (ref 0.0–2.0)
LYMPH%: 19.1 % (ref 14.0–49.7)
MCHC: 32.5 g/dL (ref 31.5–36.0)
MCV: 93.6 fL (ref 79.5–101.0)
MONO%: 11.9 % (ref 0.0–14.0)
Platelets: 155 10*3/uL (ref 145–400)
RBC: 4.53 10*6/uL (ref 3.70–5.45)
nRBC: 0 % (ref 0–0)

## 2010-03-27 ENCOUNTER — Ambulatory Visit: Payer: Self-pay | Admitting: Oncology

## 2010-03-29 LAB — CBC WITH DIFFERENTIAL/PLATELET
BASO%: 0.4 % (ref 0.0–2.0)
EOS%: 4.4 % (ref 0.0–7.0)
LYMPH%: 19.9 % (ref 14.0–49.7)
MCH: 31.2 pg (ref 25.1–34.0)
MCHC: 33.8 g/dL (ref 31.5–36.0)
MCV: 92.3 fL (ref 79.5–101.0)
MONO%: 14 % (ref 0.0–14.0)
NEUT#: 3 10*3/uL (ref 1.5–6.5)
Platelets: 210 10*3/uL (ref 145–400)
RBC: 4.46 10*6/uL (ref 3.70–5.45)
RDW: 15.3 % — ABNORMAL HIGH (ref 11.2–14.5)

## 2010-03-29 LAB — COMPREHENSIVE METABOLIC PANEL
ALT: 18 U/L (ref 0–35)
AST: 28 U/L (ref 0–37)
Albumin: 3.7 g/dL (ref 3.5–5.2)
Alkaline Phosphatase: 49 U/L (ref 39–117)
Glucose, Bld: 86 mg/dL (ref 70–99)
Potassium: 4.3 mEq/L (ref 3.5–5.3)
Sodium: 141 mEq/L (ref 135–145)
Total Protein: 6.3 g/dL (ref 6.0–8.3)

## 2010-04-12 ENCOUNTER — Ambulatory Visit
Admission: RE | Admit: 2010-04-12 | Discharge: 2010-04-12 | Payer: Self-pay | Source: Home / Self Care | Attending: Oncology | Admitting: Oncology

## 2010-04-12 ENCOUNTER — Encounter: Payer: Self-pay | Admitting: Oncology

## 2010-04-19 ENCOUNTER — Encounter: Payer: Self-pay | Admitting: Internal Medicine

## 2010-04-19 LAB — CBC WITH DIFFERENTIAL/PLATELET
Basophils Absolute: 0 10*3/uL (ref 0.0–0.1)
EOS%: 6 % (ref 0.0–7.0)
HCT: 44.4 % (ref 34.8–46.6)
HGB: 14.3 g/dL (ref 11.6–15.9)
LYMPH%: 20.8 % (ref 14.0–49.7)
MCH: 29.9 pg (ref 25.1–34.0)
MCV: 92.7 fL (ref 79.5–101.0)
MONO%: 12 % (ref 0.0–14.0)
NEUT%: 60.2 % (ref 38.4–76.8)
Platelets: 129 10*3/uL — ABNORMAL LOW (ref 145–400)

## 2010-04-19 LAB — COMPREHENSIVE METABOLIC PANEL
AST: 58 U/L — ABNORMAL HIGH (ref 0–37)
Albumin: 3.8 g/dL (ref 3.5–5.2)
Alkaline Phosphatase: 45 U/L (ref 39–117)
BUN: 9 mg/dL (ref 6–23)
Creatinine, Ser: 0.84 mg/dL (ref 0.40–1.20)
Potassium: 5.4 mEq/L — ABNORMAL HIGH (ref 3.5–5.3)

## 2010-05-08 ENCOUNTER — Ambulatory Visit (HOSPITAL_BASED_OUTPATIENT_CLINIC_OR_DEPARTMENT_OTHER): Payer: Medicare Other | Admitting: Oncology

## 2010-05-10 LAB — CBC WITH DIFFERENTIAL/PLATELET
BASO%: 0.7 % (ref 0.0–2.0)
Basophils Absolute: 0 10*3/uL (ref 0.0–0.1)
EOS%: 4.3 % (ref 0.0–7.0)
Eosinophils Absolute: 0.2 10*3/uL (ref 0.0–0.5)
HCT: 42.9 % (ref 34.8–46.6)
HGB: 14.2 g/dL (ref 11.6–15.9)
LYMPH%: 24.4 % (ref 14.0–49.7)
MCH: 30.5 pg (ref 25.1–34.0)
MCHC: 33.1 g/dL (ref 31.5–36.0)
MCV: 92.1 fL (ref 79.5–101.0)
MONO#: 0.5 10*3/uL (ref 0.1–0.9)
MONO%: 10.7 % (ref 0.0–14.0)
NEUT#: 2.6 10*3/uL (ref 1.5–6.5)
NEUT%: 59.9 % (ref 38.4–76.8)
Platelets: 147 10*3/uL (ref 145–400)
RBC: 4.66 10*6/uL (ref 3.70–5.45)
RDW: 13.9 % (ref 11.2–14.5)
WBC: 4.4 10*3/uL (ref 3.9–10.3)
lymph#: 1.1 10*3/uL (ref 0.9–3.3)
nRBC: 0 % (ref 0–0)

## 2010-05-21 ENCOUNTER — Encounter: Payer: Self-pay | Admitting: Oncology

## 2010-05-30 NOTE — Assessment & Plan Note (Signed)
Summary: diarrhea--stc   Vital Signs:  Patient profile:   77 year old female Height:      62 inches Weight:      108 pounds BMI:     19.82 O2 Sat:      97 % on Room air Temp:     97.1 degrees F oral Pulse rate:   52 / minute BP sitting:   168 / 70  (left arm) Cuff size:   regular  Vitals Entered By: Bill Salinas CMA (January 13, 2010 11:55 AM)  O2 Flow:  Room air CC: pt here with c/o diarrhea x 4 days off and on. Pt states her stools are more solid today but does describe color of BM as dark black/ ab   Primary Care Gabriana Wilmott:  Norins  CC:  pt here with c/o diarrhea x 4 days off and on. Pt states her stools are more solid today but does describe color of BM as dark black/ ab.  History of Present Illness: Patient seen urgently at request of [facility]. She has been c/o profound weakness and DOE fo weeks. No diagnosis had been made. She had routine lab at [facility] 9/15 - Hgb was 9.8 down from 12.5 August 17th. She had an episode Monday , Sept 12th of prolonged watery diarrhea with black loose stool. No blood or melena. She had loose stools that were dark on Tuesday. Thereafter she started taking peptobismal. For the last 2 days she has had formed stools that were dark brown. She does report having some abdominal pain but nothing severe. she also endorses have abdominal bloating. Until Tuesday, 9/12, she had been taking aleve several times a week. She denies chest pain, tachycardia, frank bleeding, SOB at rest.  Current Medications (verified): 1)  Multivitamins   Tabs (Multiple Vitamin) .... Take 1 Tablet By Mouth Once A Day 2)  Aleve 220 Mg  Tabs (Naproxen Sodium) .... Two Times A Day As Needed 3)  Fish Oil 1000 Mg Caps (Omega-3 Fatty Acids) .... Take 2 Tablet By Mouth Once A Day 4)  Amitriptyline Hcl 25 Mg Tabs (Amitriptyline Hcl) .Marland Kitchen.. 1 Once Daily 5)  Metoprolol Succinate 50 Mg Xr24h-Tab (Metoprolol Succinate) .Marland Kitchen.. 1 By Mouth Once Daily 6)  Furosemide 40 Mg Tabs (Furosemide) .Marland Kitchen.. 1  Once Daily 7)  Herceptin 440 Mg Solr (Trastuzumab) .... Q 3 Weeks  Allergies (verified): 1)  ! 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  The patient denies anorexia, fever, weight loss, decreased hearing, chest pain, syncope, peripheral edema, prolonged cough, headaches, hemoptysis, hematochezia, severe indigestion/heartburn, incontinence, suspicious skin lesions, difficulty walking, unusual weight change, abnormal bleeding, and enlarged lymph nodes.    Physical Exam  General:  Very elderly thin white female in no distress Head:  normocephalic and atraumatic.   Eyes:  pupils equal and pupils round.  C&S clear Mouth:  no oral lesions Neck:  supple.   Lungs:  normal respiratory effort, normal breath sounds, no crackles, and no wheezes.   Heart:  normal rate and regular rhythm.   Abdomen:  soft, normal bowel sounds, no distention, no guarding, and no rigidity.  Tender to palpation in the epigastrum Msk:  no joint tenderness, no joint swelling, no joint warmth, and no joint instability.   Pulses:  2+ radial Neurologic:  alert & oriented X3, cranial nerves II-XII intact, and gait normal.   Skin:  turgor normal and color normal.   Cervical Nodes:  no anterior cervical adenopathy and no posterior cervical adenopathy.   Inguinal Nodes:  no R inguinal adenopathy and no L inguinal adenopathy.   Psych:  Oriented X3, memory intact for recent and remote, normally interactive, and good eye contact.     Impression &  Recommendations:  Problem # 1:  ANEMIA (ICD-285.9) With sudden drop in Hgb, loose black stools and weakness with DOE very worried about UGI bleed. She does seem hemodynamically stable.  Plan - lab - CBC to assess for continued blood loss, retic count and iron studies.           If CBC dropping - will admit           if CBC stable - nexium 40mg  two times a day x 14 days then once daily                                   No NSAIDs  Orders: TLB-CBC Platelet - w/Differential (85025-CBCD) TLB-IBC Pnl (Iron/FE;Transferrin) (83550-IBC) T-Reticulocyte Count, Automated (95188-41660)  Addendum: Hgb 10.7, Fe down, retic normal  Plan - patient called: she is to take nexium two times a day for 5 days then omeprazole 40mg  once daily x 28 days; take ferrous gluconate (OTC) 325mg  two times a day; follow-up Hgb Sept 20th.  Complete Medication List: 1)  Multivitamins Tabs (Multiple vitamin) .... Take 1 tablet by mouth once a day 2)  Aleve 220 Mg Tabs (Naproxen sodium) .... Two times a day as needed 3)  Fish Oil 1000 Mg Caps (Omega-3 fatty acids) .... Take 2 tablet by mouth once a day 4)  Amitriptyline Hcl 25 Mg Tabs (Amitriptyline hcl) .Marland Kitchen.. 1 once daily 5)  Metoprolol Succinate 50 Mg Xr24h-tab (Metoprolol succinate) .Marland Kitchen.. 1 by mouth once daily 6)  Furosemide 40 Mg Tabs (Furosemide) .Marland Kitchen.. 1 once daily 7)  Herceptin 440 Mg Solr (Trastuzumab) .... Q 3 weeks   atient: [name] Note: All result statuses are Final unless otherwise noted.  Tests: (1) CBC Platelet w/Diff (CBCD)   White Cell Count     [L]  3.8 K/uL                    4.5-10.5   Red Cell Count       [L]  3.62 Mil/uL                 3.87-5.11   Hemoglobin           [L]  10.7 g/dL                   63.0-16.0   Hematocrit           [L]  31.5 %                      36.0-46.0   MCV                       87.1 fl                     78.0-100.0  MCHC                      33.8 g/dL                   16.1-09.6   RDW                  [H]  18.5  %                      11.5-14.6   Platelet Count            162.0 K/uL                  150.0-400.0   Neutrophil %              55.3 %                      43.0-77.0   Lymphocyte %              26.8 %                      12.0-46.0   Monocyte %                11.6 %                      3.0-12.0   Eosinophils%         [H]  5.6 %                       0.0-5.0   Basophils %               0.7 %                       0.0-3.0   Neutrophill Absolute      2.1 K/uL                    1.4-7.7   Lymphocyte Absolute       1.0 K/uL                    0.7-4.0   Monocyte Absolute         0.4 K/uL                    0.1-1.0  Eosinophils, Absolute                             0.2 K/uL                    0.0-0.7   Basophils Absolute        0.0 K/uL                    0.0-0.1  Tests: (2) IBC Panel (IBC)   Iron                      52 ug/dL                    04-540   Transferrin               267.4 mg/dL  212.0-360.0   Iron Saturation      [L]  13.9 %                      20.0-50.0 Tests: (1) Reticulocyte Count (RETIC) (10050)   % Retic                   2.0 %                       0.4-3.1   RBC                  [L]  3.66 MIL/uL                 3.87-5.11   ABS Retic                 73.2 K/uL                   19.0-186.0

## 2010-05-30 NOTE — Letter (Signed)
[facility]   Im[facility] By: Lester [place]ia 05/24/2009 12:51:09  _____________________________________________________________________  External Attachment:    Type:   Image     Comment:   External Document

## 2010-05-30 NOTE — Letter (Signed)
[facility]   Imported By: Lester Wilton 05/12/2009 09:40:52  _____________________________________________________________________  External Attachment:    Type:   Image     Comment:   External Document

## 2010-05-30 NOTE — Letter (Signed)
[facility]   Imported By: Lester Erin 08/03/2009 09:01:19  _____________________________________________________________________  External Attachment:    Type:   Image     Comment:   External Document

## 2010-05-30 NOTE — Letter (Signed)
[facility]   Imported By: Sherian Rein 12/06/2009 10:55:47  _____________________________________________________________________  External Attachment:    Type:   Image     Comment:   External Document

## 2010-05-30 NOTE — Letter (Signed)
[facility]   Imported By: Lennie Odor 08/22/2009 17:19:03  _____________________________________________________________________  External Attachment:    Type:   Image     Comment:   External Document

## 2010-05-30 NOTE — Letter (Signed)
Summary: Internal Correspondence  Internal Correspondence   Imported By: Lester Leal 05/12/2009 09:37:50  _____________________________________________________________________  External Attachment:    Type:   Image     Comment:   External Document

## 2010-05-30 NOTE — Progress Notes (Signed)
Summary: PA-Nexium  Phone Note From Pharmacy   Summary of Call: PA-Nexium, called [employer] @ [phone], awaiting form. Dagoberto Reef  January 19, 2010 3:35 PM  PA-Faxed to [facility] @ [phone], awaiting approval. Dagoberto Reef  January 19, 2010 3:52 PM  Nexiium denied is considered a non-formulary medication, must try and fail lansoprazole or omerprazole. Initial call taken by: Dagoberto Reef,  January 20, 2010 10:18 AM  Follow-up for Phone Call        ok for lansoprazole 30mg  once daily, #30, refill as needed  Follow-up by: Jacques Navy MD,  January 20, 2010 6:22 PM  Additional Follow-up for Phone Call Additional follow up Details #1::        Pt called and stated she started over the counter Prilosec, is that ok?   Pt made appt for 9/27 at 3:50 pm to discuss this and various conditions, sched for 30 min visit tomorrow. Additional Follow-up by: Verdell Face,  January 23, 2010 9:41 AM

## 2010-05-30 NOTE — Progress Notes (Signed)
Summary: BP READINGS  Phone Note Call from Patient Call back at [facility] Phone [phone]   Summary of Call: BP Readings: Checked once daily, starting from 2/16: 148/72 136/75 150/73 154/80 162/81 137/74  She is taking Toprol XL 25mg  1 two times a day and Furosemide 20mg  1 once daily.  Initial call taken by: Lamar Sprinkles, CMA,  June 24, 2009 1:02 PM  Follow-up for Phone Call        increase furosemide to 40mg  daily. Please change med list Follow-up by: Jacques Navy MD,  June 24, 2009 2:37 PM  Additional Follow-up for Phone Call Additional follow up Details #1::        Pt informed  Additional Follow-up by: Lamar Sprinkles, CMA,  June 24, 2009 5:38 PM    New/Updated Medications: FUROSEMIDE 40 MG TABS (FUROSEMIDE) 1 once daily Prescriptions: FUROSEMIDE 40 MG TABS (FUROSEMIDE) 1 once daily  #90 x 1   Entered by:   Lamar Sprinkles, CMA   Authorized by:   Jacques Navy MD   Signed by:   Lamar Sprinkles, CMA on 06/24/2009   Method used:   Electronically to        [facility]  [phone]* (retail)       [address]       [place], [place]  [address]       Ph: [phone] or [phone]       Fax: [phone]   RxID:   [id number]

## 2010-05-30 NOTE — Letter (Signed)
[facility]   Imported By: Lester  05/24/2009 12:52:43  _____________________________________________________________________  External Attachment:    Type:   Image     Comment:   External Document

## 2010-05-30 NOTE — Letter (Signed)
Summary: [facility]  [facility]   Imported By: Sherian Rein 02/10/2010 08:51:04  _____________________________________________________________________  External Attachment:    Type:   Image     Comment:   External Document

## 2010-05-30 NOTE — Letter (Signed)
Summary: [facility]   Imported By: Sherian Rein 05/16/2009 14:07:49  _____________________________________________________________________  External Attachment:    Type:   Image     Comment:   External Document

## 2010-05-30 NOTE — Progress Notes (Signed)
Summary: MED PROBLEM  Phone Note Call from Patient   Summary of Call: Pt says that there is a mix up with her medication and would like [place] to clear this up with the pharmacy. Need to call pharm to find out exactly what she has been filling, metoprolol - succinate OR tartrate.   (See previous phone note. Pt says she takes metoprolol succinate 25mg  1 two times a day. EMR has 1 daily.)  Initial call taken by: Lamar Sprinkles, CMA,  June 27, 2009 3:10 PM  Follow-up for Phone Call        Per pharm, pt has been taking metoprolol succinate 25mg . Pt has been taking this med two times a day. Is this ok? If yes EMR needs to be updated with new directions and qty.  Follow-up by: Lamar Sprinkles, CMA,  June 27, 2009 3:50 PM  Additional Follow-up for Phone Call Additional follow up Details #1::        Reviewed last office note and subsequent phone notes: her meds had been advance to furosemide 40mg  once daily and the metoprolol succinate XR 25 to two times a day.  Change the metoprolol succinate XR to 50mg  once a day, #30, as needed refills. Continue furosemide 40mg  once daily.  Additional Follow-up by: Jacques Navy MD,  June 27, 2009 4:26 PM    Additional Follow-up for Phone Call Additional follow up Details #2::    Left vm for pt that pharm problems were resolved and new rx was at pharmacy.  Follow-up by: Lamar Sprinkles, CMA,  June 28, 2009 1:25 PM  New/Updated Medications: METOPROLOL SUCCINATE 50 MG XR24H-TAB (METOPROLOL SUCCINATE) 1 by mouth once daily Prescriptions: METOPROLOL SUCCINATE 50 MG XR24H-TAB (METOPROLOL SUCCINATE) 1 by mouth once daily  #90 x 3   Entered by:   Lamar Sprinkles, CMA   Authorized by:   Jacques Navy MD   Signed by:   Lamar Sprinkles, CMA on 06/28/2009   Method used:   Electronically to        [facility]  [phone]* (retail)       [address]       [place], [place]  [address]       Ph: [phone] or  [phone]       Fax: [phone]   RxID:   [id number]   Appended Document: MED PROBLEM ptin  Appended Document: MED PROBLEM Pt informed

## 2010-05-30 NOTE — Assessment & Plan Note (Signed)
Summary: discuss various conditions/#/cd   Vital Signs:  Patient profile:   77 year old female Height:      62 inches Weight:      105 pounds BMI:     19.27 O2 Sat:      91 % on Room air Temp:     96.3 degrees F oral Pulse rate:   47 / minute BP sitting:   102 / 60  (right arm) Cuff size:   regular  Vitals Entered By: Alysia Penna (January 24, 2010 3:48 PM)  O2 Flow:  Room air CC: pt wanted ov to get refill on nexium. pharmacist suggested prilosec until ov. /cp sma   Primary Care Provider:  Norins  CC:  pt wanted ov to get refill on nexium. pharmacist suggested prilosec until ov. /cp sma.  History of Present Illness: Patient presents for follow-up. She was recently seen and diagnosed with possible UGI bleed based on episode of melena and drop in Hgb. She was started on PPI therapy. She had a follow-up CBC at the cancer center which was reviewed: slight improvement in Hgb. She reports that she is feeling stronger. She has not had any recurrent melena.  Current Medications (verified): 1)  Multivitamins   Tabs (Multiple Vitamin) .... Take 1 Tablet By Mouth Once A Day 2)  Aleve 220 Mg  Tabs (Naproxen Sodium) .... Two Times A Day As Needed 3)  Fish Oil 1000 Mg Caps (Omega-3 Fatty Acids) .... Take 2 Tablet By Mouth Once A Day 4)  Amitriptyline Hcl 25 Mg Tabs (Amitriptyline Hcl) .Marland Kitchen.. 1 Once Daily 5)  Metoprolol Succinate 50 Mg Xr24h-Tab (Metoprolol Succinate) .Marland Kitchen.. 1 By Mouth Once Daily 6)  Furosemide 40 Mg Tabs (Furosemide) .Marland Kitchen.. 1 Once Daily 7)  Herceptin 440 Mg Solr (Trastuzumab) .... Q 3 Weeks 8)  Nexium 40 Mg Cpdr (Esomeprazole Magnesium) .Marland Kitchen.. 1 Once Daily  Allergies (verified): 1)  ! Vesicare (Solifenacin Succinate) PMH-FH-SH reviewed-no changes except otherwise noted  Review of Systems  The patient denies anorexia, fever, weight loss, chest pain, syncope, dyspnea on exertion, abdominal pain, melena, hematuria, muscle weakness, difficulty walking, abnormal bleeding, and  enlarged lymph nodes.    Physical Exam  General:  Thin elderly white woman in good spirits and no distress Eyes:  C&S clear without icterus Lungs:  normal respiratory effort.   Heart:  normal rate and regular rhythm.   Abdomen:  soft, non-tender, and normal bowel sounds.   Neurologic:  alert & oriented X3.   Skin:  color normal.   Psych:  Oriented X3, memory intact for recent and remote, normally interactive, and good eye contact.     Impression & Recommendations:  Problem # 1:  UPPER GASTROINTESTINAL HEMORRHAGE, ACUTE (ICD-578.9) Patient seems stable and improved. Hgb stable.   Plan - continue PPI therapy for a full month then H2 blocker therapy once or twice a day for several weeks than stop.   Problem # 2:  LEG CRAMPS, NOCTURNAL (ICD-729.82) C/o nocturnal leg cramps. She has had before bt not this bad.  Plan - check K and Mg           tonic water 8-16 oz per day           if no relief - gabapentin 100mg  at bedtime  Orders TLB-Potassium (K+) (84132-K) TLB-Magnesium (Mg) (83735-MG)  addendum - K and Mg normal  Complete Medication List: 1)  Multivitamins Tabs (Multiple vitamin) .... Take 1 tablet by mouth once a day 2)  Aleve 220  Mg Tabs (Naproxen sodium) .... Two times a day as needed 3)  Fish Oil 1000 Mg Caps (Omega-3 fatty acids) .... Take 2 tablet by mouth once a day 4)  Amitriptyline Hcl 25 Mg Tabs (Amitriptyline hcl) .Marland Kitchen.. 1 once daily 5)  Metoprolol Succinate 50 Mg Xr24h-tab (Metoprolol succinate) .Marland Kitchen.. 1 by mouth once daily 6)  Furosemide 40 Mg Tabs (Furosemide) .Marland Kitchen.. 1 once daily 7)  Herceptin 440 Mg Solr (Trastuzumab) .... Q 3 weeks 8)  Lansoprazole 30 Mg Cpdr (Lansoprazole) .Marland Kitchen.. 1 by mouth once daily for ugi bleed and healing  Other Orders: TLB-Hemoglobin (Hgb) (85018-HGB) Prescriptions: LANSOPRAZOLE 30 MG CPDR (LANSOPRAZOLE) 1 by mouth once daily for UGI bleed and healing  #30 x 0   Entered and Authorized by:   Jacques Navy MD   Signed by:   Jacques Navy MD on 01/24/2010   Method used:   Electronically to        [facility]  [phone]* (retail)       [address]       [place], [place]  [address]       Ph: [phone] or [phone]       Fax: [phone]   RxID:   [id number]

## 2010-05-30 NOTE — Assessment & Plan Note (Signed)
Summary: follow up on upper gi issues/rx consult-lb   Vital Signs:  Patient profile:   77 year old female Height:      62 inches Weight:      108 pounds BMI:     19.82 O2 Sat:      95 % on Room air Temp:     97.3 degrees F oral Pulse rate:   51 / minute BP sitting:   152 / 76  (left arm) Cuff size:   regular  Vitals Entered By: Bill Salinas CMA (February 23, 2010 2:15 PM)  O2 Flow:  Room air CC: ov to discuss GI issues/ ab   Primary Care Provider:  [facility]ins  CC:  ov to discuss GI issues/ ab.  History of Present Illness: Patient returns for follow-up of UGI bleed. She has been doing well with no pain or symptoms. She has completed her prescription of pantoprazole. She had recent lab at the [facility] - called for results: Hgb 12.9g. She is feeling good and is back to all her usual activities without a sense of weakness or fatigue.  She reports that her leg cramps are much better since starting tonic water daily.  Current Medications (verified): 1)  Multivitamins   Tabs (Multiple Vitamin) .... Take 1 Tablet By Mouth Once A Day 2)  Fish Oil 1000 Mg Caps (Omega-3 Fatty Acids) .... Take 2 Tablet By Mouth Once A Day 3)  Amitriptyline Hcl 25 Mg Tabs (Amitriptyline Hcl) .Marland Kitchen.. 1 Once Daily 4)  Metoprolol Succinate 50 Mg Xr24h-Tab (Metoprolol Succinate) .Marland Kitchen.. 1 By Mouth Once Daily 5)  Furosemide 40 Mg Tabs (Furosemide) .Marland Kitchen.. 1 Once Daily 6)  Herceptin 440 Mg Solr (Trastuzumab) .... Q 3 Weeks 7)  Lansoprazole 30 Mg Cpdr (Lansoprazole) .Marland Kitchen.. 1 By Mouth Once Daily For Ugi Bleed and Healing  Allergies (verified): 1)  ! 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 She remains very active socially and intellectually. former smoker   Review of Systems  The patient denies anorexia, fever, weight loss, weight gain, chest pain, dyspnea on exertion, peripheral edema, abdominal pain, melena, hematochezia, severe indigestion/heartburn, suspicious skin lesions, depression, and abnormal bleeding.    Physical Exam  General:  Thin, spry nonogenarian in no distress Head:  normocephalic and atraumatic.   Eyes:  C&S clear Lungs:  normal respiratory effort and normal breath sounds.   Heart:  normal rate and regular rhythm.   Abdomen:  soft and normal bowel sounds.   Neurologic:  alert & oriented X3 and gait normal.   Skin:  turgor normal and color normal.   Psych:  Oriented X3, normally interactive, good eye contact, and not anxious appearing.     Impression & Recommendations:  Problem # 1:  LEG CRAMPS, NOCTURNAL (ICD-729.82) Much improved on tonic water  Problem # 2:  ANEMIA (ICD-285.9) Resolved.   Plan - Ranitidine 150mg  two times a day x 14 days, then once daily x 14 days then off.  Complete Medication List: 1)  Multivitamins Tabs (Multiple vitamin) .... Take 1 tablet by mouth once a day 2)  Fish Oil 1000 Mg Caps (Omega-3 fatty acids) .... Take 2 tablet by mouth once a day 3)  Amitriptyline Hcl 25 Mg Tabs (Amitriptyline hcl) .Marland Kitchen.. 1 once  daily 4)  Metoprolol Succinate 50 Mg Xr24h-tab (Metoprolol succinate) .Marland Kitchen.. 1 by mouth once daily 5)  Herceptin 440 Mg Solr (Trastuzumab) .... Q 3 weeks 6)  Acid Reducer Maximum Strength 150 Mg Tabs (Ranitidine hcl) .Marland Kitchen.. 1 by mouth am/pm x 2 wks, 1 pm only x 2 wks   Orders Added: 1)  Est. Patient Level III [84132]

## 2010-05-30 NOTE — Progress Notes (Signed)
Summary: Rx request  Phone Note Call from Patient Call back at Home Phone [phone]   Caller: Patient Call For: Jacques Navy MD Reason for Call: Talk to Nurse Summary of Call: Patient called and stated that the blood pressure medication that was given to her on 04-07-09 is not strong enough. Her blood pressure is still high and irregular, and she wants to know if Dr. Debby Bud will give her a stronger dosage. Initial call taken by: Irma Newness,  May 04, 2009 3:40 PM  Follow-up for Phone Call        Spoke with patient and her BP has been 155-201/160.  Yesterday at Dr. Darnelle Catalan office her BP was 180/ something-patient unsure of her bottom #. Patient denies any symptoms. Please advise. Follow-up by: Lucious Groves,  May 04, 2009 3:51 PM  Additional Follow-up for Phone Call Additional follow up Details #1::        called pt: HR 54.  Plan start furosemide 20mg  once daily. Discussed her skin mets and encouraged her to take taclopaxel and herseptin. Additional Follow-up by: Jacques Navy MD,  May 04, 2009 5:14 PM    New/Updated Medications: FUROSEMIDE 20 MG TABS (FUROSEMIDE) 1 by mouth once daily Prescriptions: FUROSEMIDE 20 MG TABS (FUROSEMIDE) 1 by mouth once daily  #30 x 12   Entered and Authorized by:   Jacques Navy MD   Signed by:   Jacques Navy MD on 05/04/2009   Method used:   Electronically to        [facility]  [phone]* (retail)       [address]       [place], [place]  [address]       Ph: [phone] or [phone]       Fax: [phone]   RxID:   [id number]

## 2010-05-30 NOTE — Progress Notes (Signed)
  Phone Note Call from Patient   Summary of Call: Patient is requesting rx for Nexium. I see office visit notes state 1 two times a day then 1 once daily. EMR updated - need to call pt to inform Initial call taken by: Lamar Sprinkles, CMA,  January 19, 2010 10:25 AM  Follow-up for Phone Call        Pt informed and will check with pharmacy later today Follow-up by: Margaret Pyle, CMA,  January 19, 2010 10:32 AM    New/Updated Medications: NEXIUM 40 MG CPDR (ESOMEPRAZOLE MAGNESIUM) 1 once daily Prescriptions: NEXIUM 40 MG CPDR (ESOMEPRAZOLE MAGNESIUM) 1 once daily  #90 x 1   Entered by:   Lamar Sprinkles, CMA   Authorized by:   Jacques Navy MD   Signed by:   Lamar Sprinkles, CMA on 01/19/2010   Method used:   Electronically to        [facility]  [phone]* (retail)       [address]       [place], [place]  [address]       Ph: [phone] or [phone]       Fax: [phone]   RxID:   [id number]

## 2010-05-30 NOTE — Letter (Signed)
Summary: [facility]  [facility]   Imported By: Sherian Rein 03/20/2010 14:15:21  _____________________________________________________________________  External Attachment:    Type:   Image     Comment:   External Document

## 2010-05-30 NOTE — Medication Information (Signed)
Summary: [employer] P [employer]  [facility] [employer]   Imported By: Lester [place] 01/24/2010 09:03:30  _____________________________________________________________________  External Attachment:    Type:   Image     Comment:   External Document

## 2010-05-30 NOTE — Assessment & Plan Note (Signed)
Summary: disuss bp/pulse/cd   Vital Signs:  Patient profile:   77 year old female Height:      62 inches Weight:      112 pounds BMI:     20.56 Temp:     97.1 degrees F oral Pulse rate:   77 / minute BP sitting:   138 / 78  (left arm) Cuff size:   regular  Vitals Entered By: Bill Salinas CMA (Sep 23, 2009 4:05 PM) CC: office visit to discuss BP and Pulse, pt states her pulse rate previously has been running in the 50's/ ab   Primary Care Provider:  Mareesa Gathright  CC:  office visit to discuss BP and Pulse and pt states her pulse rate previously has been running in the 50's/ ab.  History of Present Illness: Patient had a great result to chemotherapy with Herceptin and others (see Dr. Darrall Dears note) and is in clinical remission. She is on maintenance therapy. She is feeling well.   She presents today for follow-up of BP. She is interested in stopping medication now that she is doing better.  Current Medications (verified): 1)  Multivitamins   Tabs (Multiple Vitamin) .... Take 1 Tablet By Mouth Once A Day 2)  Posture-D 600 Mg Tabs (Ca & Phos-Vit D-Mag) .... Take 1 Tablet By Mouth Two Times A Day 3)  Aleve 220 Mg  Tabs (Naproxen Sodium) .... Two Times A Day As Needed 4)  Fish Oil 1000 Mg Caps (Omega-3 Fatty Acids) .... Take 2 Tablet By Mouth Once A Day 5)  Amitriptyline Hcl 25 Mg Tabs (Amitriptyline Hcl) .Marland Kitchen.. 1 Once Daily 6)  Metoprolol Succinate 50 Mg Xr24h-Tab (Metoprolol Succinate) .Marland Kitchen.. 1 By Mouth Once Daily 7)  Furosemide 40 Mg Tabs (Furosemide) .Marland Kitchen.. 1 Once Daily  Allergies (verified): 1)  ! Vesicare (Solifenacin Succinate)  Past History:  Past Medical History: Last updated: 11/14/2006 breast cancer cataracts Coronary artery disease-cath with trivial disease Diverticulosis, colon Osteoporosis-s/p compression fx T12  Past Surgical History: Last updated: 11/14/2006 Hysterectomy Lumpectomy excision for Paget's vulvar vertbroplasty T12 PSH reviewed for relevance  Review  of Systems  The patient denies anorexia, fever, weight loss, weight gain, chest pain, dyspnea on exertion, headaches, abdominal pain, muscle weakness, difficulty walking, unusual weight change, and enlarged lymph nodes.    Physical Exam  General:  WNWD white female looking younger than her stated age.  Head:  normocephalic.   Eyes:  corneas and lenses clear and no injection.   Lungs:  normal respiratory effort.   Heart:  normal rate and regular rhythm.   Neurologic:  alert & oriented X3, cranial nerves II-XII intact, and gait normal.   Skin:  turgor normal and color normal.  Chest wall not examined Psych:  Oriented X3, normally interactive, good eye contact, and not anxious appearing.     Impression & Recommendations:  Problem # 1:  MALIGNANT NEOPLASM OF BREAST UNSPECIFIED SITE (ICD-174.9) per Dr. Darrall Dears notes.  Problem # 2:  ELEVATED BLOOD PRESSURE (ICD-796.2) BP is doing well. She would like to stop medication.  Plan - trial off furosemide. If BP remains controlled can then have a trial off metoprolol.   Her updated medication list for this problem includes:    Metoprolol Succinate 50 Mg Xr24h-tab (Metoprolol succinate) .Marland Kitchen... 1 by mouth once daily    Furosemide 40 Mg Tabs (Furosemide) .Marland Kitchen... 1 once daily  Complete Medication List: 1)  Multivitamins Tabs (Multiple vitamin) .... Take 1 tablet by mouth once a day 2)  Posture-d 600  Mg Tabs (Ca & phos-vit d-mag) .... Take 1 tablet by mouth two times a day 3)  Aleve 220 Mg Tabs (Naproxen sodium) .... Two times a day as needed 4)  Fish Oil 1000 Mg Caps (Omega-3 fatty acids) .... Take 2 tablet by mouth once a day 5)  Amitriptyline Hcl 25 Mg Tabs (Amitriptyline hcl) .Marland Kitchen.. 1 once daily 6)  Metoprolol Succinate 50 Mg Xr24h-tab (Metoprolol succinate) .Marland Kitchen.. 1 by mouth once daily 7)  Furosemide 40 Mg Tabs (Furosemide) .Marland Kitchen.. 1 once daily  Patient Instructions: 1)  Oncology - doing well. That is [place]. I defer to Dr. Darnelle Catalan on these  matters. 2)  Blood pressure - stop the furosemide. Follow your blood pressure. If the pressure goes up restart the furosemide then stop the metoprolol and monitor the blood pressure. We will see if you can get by with less medicine.

## 2010-05-30 NOTE — Progress Notes (Signed)
Summary: BP Med  Phone Note Call from Patient Call back at Home Phone [phone]   Summary of Call: Patient left message on triage that she is on Furosemide and Metoprolol for 2 months and cannot tell that it has done anything for her BP. I spoke with the patient and she denies being dizzy, headaches, numbness, and etc. Per the patient her cancer meds have a lot to do with her BP. At MD visits her BP has been up to 185/85 and as low as 124/80. Patient would like to know what you advise in regards to the meds above. Please advise. Initial call taken by: Lucious Groves,  June 02, 2009 2:36 PM  Follow-up for Phone Call        Need to have a record of BP readings in order to know what to do. I suggest she get a series of readings, the local drug store is ok, and report back.  Follow-up by: Jacques Navy MD,  June 02, 2009 3:03 PM  Additional Follow-up for Phone Call Additional follow up Details #1::        Notified pt with md response Additional Follow-up by: Orlan Leavens,  June 02, 2009 3:23 PM

## 2010-05-30 NOTE — Letter (Signed)
[facility]   Imported By: Sherian Rein 07/15/2009 14:43:34  _____________________________________________________________________  External Attachment:    Type:   Image     Comment:   External Document

## 2010-05-30 NOTE — Progress Notes (Signed)
Summary: RESULTS  Phone Note Outgoing Call   Reason for Call: Discuss lab or test results Summary of Call: please call patient: K and Mg normal. Hgb 11.2, slight increase from last Hgb at cancer center.  Thanks Initial call taken by: Jacques Navy MD,  January 25, 2010 3:14 PM  Follow-up for Phone Call        Left detailed vm on pt's hm # Follow-up by: Lamar Sprinkles, CMA,  January 25, 2010 5:52 PM

## 2010-05-30 NOTE — Letter (Signed)
Summary: [facility]  [facility]   Imported By: Sherian Rein 02/10/2010 09:15:39  _____________________________________________________________________  External Attachment:    Type:   Image     Comment:   External Document

## 2010-05-30 NOTE — Letter (Signed)
[facility]   Imported By: Sherian Rein 07/15/2009 14:12:56  _____________________________________________________________________  External Attachment:    Type:   Image     Comment:   External Document

## 2010-05-31 ENCOUNTER — Ambulatory Visit (HOSPITAL_BASED_OUTPATIENT_CLINIC_OR_DEPARTMENT_OTHER): Payer: Medicare Other | Admitting: Oncology

## 2010-05-31 DIAGNOSIS — C50519 Malignant neoplasm of lower-outer quadrant of unspecified female breast: Secondary | ICD-10-CM

## 2010-05-31 DIAGNOSIS — Z5112 Encounter for antineoplastic immunotherapy: Secondary | ICD-10-CM

## 2010-05-31 LAB — CBC WITH DIFFERENTIAL/PLATELET
BASO%: 0.6 % (ref 0.0–2.0)
LYMPH%: 22 % (ref 14.0–49.7)
MCHC: 32.9 g/dL (ref 31.5–36.0)
MONO#: 0.6 10*3/uL (ref 0.1–0.9)
MONO%: 10.3 % (ref 0.0–14.0)
Platelets: 144 10*3/uL — ABNORMAL LOW (ref 145–400)
RBC: 4.69 10*6/uL (ref 3.70–5.45)
WBC: 5.3 10*3/uL (ref 3.9–10.3)
nRBC: 0 % (ref 0–0)

## 2010-06-01 NOTE — Letter (Signed)
Summary: [facility]  [facility]   Imported By: Lennie Odor 05/19/2010 14:47:44  _____________________________________________________________________  External Attachment:    Type:   Image     Comment:   External Document

## 2010-06-20 ENCOUNTER — Ambulatory Visit (HOSPITAL_COMMUNITY)
Admission: RE | Admit: 2010-06-20 | Discharge: 2010-06-20 | Disposition: A | Discharge status: Home / Self Care | Payer: Medicare Other | Source: Ambulatory Visit | Attending: Oncology | Admitting: Oncology

## 2010-06-20 DIAGNOSIS — I079 Rheumatic tricuspid valve disease, unspecified: Secondary | ICD-10-CM | POA: Insufficient documentation

## 2010-06-20 DIAGNOSIS — C801 Malignant (primary) neoplasm, unspecified: Secondary | ICD-10-CM | POA: Insufficient documentation

## 2010-06-20 DIAGNOSIS — Z09 Encounter for follow-up examination after completed treatment for conditions other than malignant neoplasm: Secondary | ICD-10-CM | POA: Insufficient documentation

## 2010-06-20 DIAGNOSIS — I080 Rheumatic disorders of both mitral and aortic valves: Secondary | ICD-10-CM | POA: Insufficient documentation

## 2010-06-20 DIAGNOSIS — I059 Rheumatic mitral valve disease, unspecified: Secondary | ICD-10-CM

## 2010-06-21 ENCOUNTER — Encounter (HOSPITAL_BASED_OUTPATIENT_CLINIC_OR_DEPARTMENT_OTHER): Payer: Medicare Other | Admitting: Oncology

## 2010-06-21 ENCOUNTER — Encounter: Payer: Self-pay | Admitting: Internal Medicine

## 2010-06-21 ENCOUNTER — Other Ambulatory Visit: Payer: Self-pay | Admitting: Oncology

## 2010-06-21 DIAGNOSIS — C50519 Malignant neoplasm of lower-outer quadrant of unspecified female breast: Secondary | ICD-10-CM

## 2010-06-21 DIAGNOSIS — Z5112 Encounter for antineoplastic immunotherapy: Secondary | ICD-10-CM

## 2010-06-21 DIAGNOSIS — C7952 Secondary malignant neoplasm of bone marrow: Secondary | ICD-10-CM

## 2010-06-21 LAB — CBC WITH DIFFERENTIAL/PLATELET
BASO%: 1.1 % (ref 0.0–2.0)
LYMPH%: 24.4 % (ref 14.0–49.7)
MCHC: 32.9 g/dL (ref 31.5–36.0)
MCV: 91.9 fL (ref 79.5–101.0)
MONO%: 11.1 % (ref 0.0–14.0)
Platelets: 155 10*3/uL (ref 145–400)
RBC: 4.54 10*6/uL (ref 3.70–5.45)
RDW: 14.5 % (ref 11.2–14.5)
WBC: 4.7 10*3/uL (ref 3.9–10.3)
nRBC: 0 % (ref 0–0)

## 2010-06-21 LAB — TECHNOLOGIST REVIEW

## 2010-07-11 NOTE — Letter (Signed)
Summary: [facility]  [facility]   Imported By: Lennie Odor 07/06/2010 09:47:53  _____________________________________________________________________  External Attachment:    Type:   Image     Comment:   External Document

## 2010-07-12 ENCOUNTER — Encounter (HOSPITAL_BASED_OUTPATIENT_CLINIC_OR_DEPARTMENT_OTHER): Payer: Medicare Other | Admitting: Oncology

## 2010-07-12 ENCOUNTER — Other Ambulatory Visit: Payer: Self-pay | Admitting: Oncology

## 2010-07-12 DIAGNOSIS — Z5112 Encounter for antineoplastic immunotherapy: Secondary | ICD-10-CM

## 2010-07-12 DIAGNOSIS — C50519 Malignant neoplasm of lower-outer quadrant of unspecified female breast: Secondary | ICD-10-CM

## 2010-07-12 LAB — CBC WITH DIFFERENTIAL/PLATELET
BASO%: 0.7 % (ref 0.0–2.0)
EOS%: 3 % (ref 0.0–7.0)
MCH: 30.1 pg (ref 25.1–34.0)
MCHC: 32.6 g/dL (ref 31.5–36.0)
MCV: 92.4 fL (ref 79.5–101.0)
MONO%: 10 % (ref 0.0–14.0)
RBC: 4.62 10*6/uL (ref 3.70–5.45)
RDW: 14.7 % — ABNORMAL HIGH (ref 11.2–14.5)
nRBC: 0 % (ref 0–0)

## 2010-08-01 ENCOUNTER — Other Ambulatory Visit: Payer: Self-pay | Admitting: Internal Medicine

## 2010-08-02 ENCOUNTER — Encounter (HOSPITAL_BASED_OUTPATIENT_CLINIC_OR_DEPARTMENT_OTHER): Payer: Medicare Other | Admitting: Oncology

## 2010-08-02 ENCOUNTER — Other Ambulatory Visit: Payer: Self-pay | Admitting: Oncology

## 2010-08-02 DIAGNOSIS — Z5112 Encounter for antineoplastic immunotherapy: Secondary | ICD-10-CM

## 2010-08-02 DIAGNOSIS — C50519 Malignant neoplasm of lower-outer quadrant of unspecified female breast: Secondary | ICD-10-CM

## 2010-08-02 LAB — CBC WITH DIFFERENTIAL/PLATELET
BASO%: 0.5 % (ref 0.0–2.0)
LYMPH%: 20.4 % (ref 14.0–49.7)
MCHC: 32.2 g/dL (ref 31.5–36.0)
MONO#: 0.5 10*3/uL (ref 0.1–0.9)
Platelets: 136 10*3/uL — ABNORMAL LOW (ref 145–400)
RBC: 4.55 10*6/uL (ref 3.70–5.45)
RDW: 14.4 % (ref 11.2–14.5)
WBC: 4.4 10*3/uL (ref 3.9–10.3)
nRBC: 0 % (ref 0–0)

## 2010-08-23 ENCOUNTER — Encounter (HOSPITAL_BASED_OUTPATIENT_CLINIC_OR_DEPARTMENT_OTHER): Payer: Medicare Other | Admitting: Oncology

## 2010-08-23 ENCOUNTER — Other Ambulatory Visit: Payer: Self-pay | Admitting: Oncology

## 2010-08-23 DIAGNOSIS — Z5112 Encounter for antineoplastic immunotherapy: Secondary | ICD-10-CM

## 2010-08-23 DIAGNOSIS — C50519 Malignant neoplasm of lower-outer quadrant of unspecified female breast: Secondary | ICD-10-CM

## 2010-08-23 LAB — CBC WITH DIFFERENTIAL/PLATELET
BASO%: 0.7 % (ref 0.0–2.0)
Basophils Absolute: 0 10*3/uL (ref 0.0–0.1)
EOS%: 3.9 % (ref 0.0–7.0)
HGB: 14.4 g/dL (ref 11.6–15.9)
MCH: 30.4 pg (ref 25.1–34.0)
MCHC: 33 g/dL (ref 31.5–36.0)
RDW: 13.9 % (ref 11.2–14.5)
lymph#: 0.9 10*3/uL (ref 0.9–3.3)

## 2010-08-23 LAB — TECHNOLOGIST REVIEW: Technologist Review: 2

## 2010-09-12 ENCOUNTER — Ambulatory Visit (HOSPITAL_COMMUNITY)
Admission: RE | Admit: 2010-09-12 | Discharge: 2010-09-12 | Disposition: A | Discharge status: Home / Self Care | Payer: Medicare Other | Source: Ambulatory Visit | Attending: Oncology | Admitting: Oncology

## 2010-09-12 DIAGNOSIS — I059 Rheumatic mitral valve disease, unspecified: Secondary | ICD-10-CM

## 2010-09-12 DIAGNOSIS — I080 Rheumatic disorders of both mitral and aortic valves: Secondary | ICD-10-CM | POA: Insufficient documentation

## 2010-09-12 DIAGNOSIS — C50919 Malignant neoplasm of unspecified site of unspecified female breast: Secondary | ICD-10-CM | POA: Insufficient documentation

## 2010-09-12 NOTE — Assessment & Plan Note (Signed)
[facility]                           PRIMARY CARE OFFICE NOTE   ARIN, VANOSDOL                    MRN:          [id number]  DATE:11/25/2006                            DOB:          1913-12-27    SUBJECTIVE:  Ms. [name] is a 77 year old Caucasian woman whom I saw for  multiple medical problems, including a known history of breast cancer,  Paget's, vulvar disease, diverticulosis, history of subendocardial  myocardial infarction in 2000, with a cardiac catheterization that  revealed old disease.   The patient presents acutely today as an add-on patient because of chest  discomfort and epigastric pain.  She reports that she was awakened from  sleep at about 4 a.m. with severe epigastric pain and discomfort, with  radiation to her left shoulder.  She had no diaphoresis, no shortness of  breath, and does not describe any heavy weight or pressure on her chest.  She took Mylanta and had eructation and relief of the pain in her left  chest and shoulder, but continued to have significant epigastric pain.  Of note, the patient reports that she did have a new food at dinner the  night prior, which was highly spiced.   CURRENT MEDICATIONS:  1. Multivitamin daily.  2. Calcium daily.   REVIEW OF SYSTEMS:  Negative for any fevers, sweats or chills.  She has  had no respiratory complaints.  She has had no genitourinary complaints.  She has had no exertional discomfort or chest discomfort, other than  this morning.  She does remain active.  The patient does follow with  oncology on a regular basis.   PHYSICAL EXAMINATION:  VITAL SIGNS:  Temperature 97.2 degrees, blood  pressure 164/89, pulse 66, weight 112 pounds.  GENERAL:  This is a pleasant woman, looking younger than her stated  chronologic age, who is in no acute distress.  CHEST:  The patient is moving air well.  No rales, wheezes or rhonchi  are noted.  There is no increased work of breathing.  CARDIOVASCULAR:  With 2+ radial pulse.  She had a quiet precordium.  Her  heart rate was regular and somewhat louder than usual, due to having had  a mastectomy on the left.  NECK:  No carotid bruits.  BREASTS:  A mastectomy on the left.  ABDOMEN:  Positive bowel sounds.  She had significant tenderness to  palpation in the epigastric region.   A 12-lead electrocardiogram reveals a sinus bradycardia with no signs of  acute ischemia or strain.   ASSESSMENT/PLAN:  Gastrointestinal:  A patient with severe epigastric  pain and discomfort with some earlier chest pain and discomfort,  suggestive of dyspepsia with some reflux:  I would attribute this to  irritation from diet.  PLAN:  The patient is provided with samples of  Zegerid to take daily for 10 days.  She may use liquid antacids as  needed.  The patient is instructed that if  her symptoms do not respond to Zegerid, or if she has progressive  symptoms with increasing pain, chest pressure, shortness of breath,  diaphoresis, radiation of the pain  to arm or jaw, she is to notify me so  I can arrange further immediate evaluation.     [name] Gess Norins, MD  Electronically Signed    MEN/MedQ  DD: 11/26/2006  DT: 11/26/2006  Job #: [id number]

## 2010-09-13 ENCOUNTER — Other Ambulatory Visit: Payer: Self-pay | Admitting: Oncology

## 2010-09-13 ENCOUNTER — Encounter (HOSPITAL_BASED_OUTPATIENT_CLINIC_OR_DEPARTMENT_OTHER): Payer: Medicare Other | Admitting: Oncology

## 2010-09-13 DIAGNOSIS — C7952 Secondary malignant neoplasm of bone marrow: Secondary | ICD-10-CM

## 2010-09-13 DIAGNOSIS — C50519 Malignant neoplasm of lower-outer quadrant of unspecified female breast: Secondary | ICD-10-CM

## 2010-09-13 DIAGNOSIS — Z5112 Encounter for antineoplastic immunotherapy: Secondary | ICD-10-CM

## 2010-09-13 LAB — COMPREHENSIVE METABOLIC PANEL
ALT: 13 U/L (ref 0–35)
Alkaline Phosphatase: 42 U/L (ref 39–117)
Creatinine, Ser: 0.87 mg/dL (ref 0.40–1.20)
Sodium: 141 mEq/L (ref 135–145)
Total Bilirubin: 0.5 mg/dL (ref 0.3–1.2)
Total Protein: 6.5 g/dL (ref 6.0–8.3)

## 2010-09-13 LAB — CBC WITH DIFFERENTIAL/PLATELET
Basophils Absolute: 0 10*3/uL (ref 0.0–0.1)
Eosinophils Absolute: 0.2 10*3/uL (ref 0.0–0.5)
HCT: 44.8 % (ref 34.8–46.6)
HGB: 14.6 g/dL (ref 11.6–15.9)
MONO#: 0.5 10*3/uL (ref 0.1–0.9)
NEUT%: 62.1 % (ref 38.4–76.8)
WBC: 4.4 10*3/uL (ref 3.9–10.3)
lymph#: 1 10*3/uL (ref 0.9–3.3)

## 2010-09-15 NOTE — Letter (Signed)
February 18, 2006    [name]. Magrinat, M.D.  [address] - [facility]  [place], [place] [address]   RE:  [name], [name]  MRN:  [id number]  /  DOB:  06-11-1913   Dear [name]:   Thank you very much for seeing [name], a delightful 77 year old  woman who has had recurrent breast cancer.  I do not have all of the details  in terms of her past from her biopsy but she is scheduled for surgery the 30  and we will need you as a medical oncologist.   PAST MEDICAL HISTORY, SURGICAL:  1. Hysterectomy in 1962 secondary to metromenorrhagia.  2. Surgery for Paget's disease with the excision of lesion in 2004.  3. Breast cancer with lumpectomy at [facility] [place] with      consolidating external beam radiation therapy.   Of note the patient has continued to followup in [place], [place]  with her oncologist, Dr. Rushie Goltz, Department of Surgery, [facility],  [place], fax number [phone].  She did have external beam  radiation therapy in December 2005 directed by Dr. Dayton Scrape here in  [place].   MEDICAL:  1. Usual childhood diseases.  2. Diverticulosis by colonoscopy, last study February 06, 2002 with colon      polyp as well, as well as hemorrhoids.  3. Paget's disease.  4. History of compression fracture, undergoing kyphoplasty by Dr.      Corliss Skains in November 2006.  5. Sacroiliitis.  6. Tinnitus.   CURRENT MEDICATIONS:  Multivitamin daily.  Calcium daily.  Fortical.  Nasal  spray daily.  Nabumetone 1000 mg at bedtime as needed.  Hydrocodone as  needed.   Patient's last physical exam from February 08, 2006 revealed a normal  temperature, blood pressure was 137/86.  This is a delightful woman who  looks much younger than her stated chronologic age who is in no distress.  Cardiovascular and pulmonary exam were unremarkable.  At that visit patient  was actually taken off of Fortical and switched to Actonel for better  osteoporosis prevention and  treatment.   Attached would be most recent pertinent data from her chart including  cardiac catheterization from 2000.  If I can provide any additional  information, please do not hesitate to contact me.   As always I appreciate your assistance and the care of these nice patients.  Look forward to hearing from you.  I remain yours truly.    Sincerely,     ______________________________  Rosalyn Gess. Norins, MD    MEN/MedQ  DD: 02/18/2006  DT: 02/18/2006  Job #: [id number]

## 2010-09-15 NOTE — Op Note (Signed)
NAMECAPRINA, [name]                       ACCOUNT NO.:  [id number]   MEDICAL RECORD NO.:  [id number]                   PATIENT TYPE:  AMB   LOCATION:  SDC                                  FACILITY:  [facility]   PHYSICIAN:  Randye Lobo, M.D.                DATE OF BIRTH:  01-31-1914   DATE OF PROCEDURE:  05/26/2003  DATE OF DISCHARGE:                                 OPERATIVE REPORT   PREOPERATIVE DIAGNOSIS:  Recurrent Paget's disease of the vulva.   POSTOPERATIVE DIAGNOSIS:  Recurrent Paget's disease of the vulva.   PROCEDURE:  Wide local excision of vulvar lesion.   SURGEON:  Randye Lobo, M.D.   ANESTHESIA:  MAC under local with 1% lidocaine.   IV FLUIDS:  400 mL of Ringer's lactate.   ESTIMATED BLOOD LOSS:  Minimal.   URINE OUTPUT:  25 mL by I&O catheterization.   COMPLICATIONS:  None.   INDICATIONS FOR THE PROCEDURE:  The patient is an 77 year old gravida 3,  para 3, Caucasian female, status post local wide excision of Paget's disease  of the vulva, in June of 2004, who presented to the office with perirectal  itching and was noted to have an external hemorrhoid and also a velvety red  excoriative lesion of the right superior labia majora.  Office biopsy was  positive for extramammary Paget's disease with positive margins.  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   The patient wished for excision of the recurrence of the Paget's disease,  and a plan was made to proceed after risks, benefits, and alternatives were  discussed with her.   FINDINGS:  Examination under anesthesia revealed a 5 mm area of erythema of  the right superior labia majora, which was noted to be in an area separate  from the patient's previous right vulvar excision site.   There were no other lesions which were noted of the vulva or  perirectal  area.   PROCEDURE:  The patient was reidentified in the preoperative hold area, and  she was taken down to the operating room where MAC anesthesia was induced.  The patient was placed in the dorsolithotomy position, and the vulva was  sterilely prepped and draped.  The bladder was catheterized of urine.   An elliptical-shaped area of excision around the vulvar lesion was outlined,  and the skin was injected with 1% lidocaine without epinephrine.  A scalpel  was then used to sharply excise the lesion, which was sent to pathology.  The subcutaneous tissue was cauterized with monopolar cautery.  In the  subcutaneous layer, interrupted sutures of 2-  0 Vicryl were placed.  The skin was closed with horizontal mattress sutures  of 4-0 Vicryl.  Hemostasis was excellent at the end of the procedure, and  there were no complications.  All needle, instrument, and sponge counts were  correct.                                               Randye Lobo, M.D.    BES/MEDQ  D:  05/26/2003  T:  05/26/2003  Job:  [id number]

## 2010-09-15 NOTE — Consult Note (Signed)
[name], [name]             ACCOUNT NO.:  [id number]   MEDICAL RECORD NO.:  [id number]          PATIENT TYPE:  OUT   LOCATION:  XRAY                         FACILITY:  [facility]   PHYSICIAN:  [name], M.D.DATE OF BIRTH:  [birth year]   DATE OF CONSULTATION:  04/10/2005  DATE OF DISCHARGE:                                   CONSULTATION   BRIEF HISTORY:  This is a pleasant 77 year old female who underwent a T12  vertebroplasty with biopsy performed by Dr. Corliss [name], on March 27, 2005,  due to a painful unstable compression fracture.  The biopsy report was  negative for malignancy.  The patient returns today for a followup  evaluation, approximately two weeks after her procedure.   PAST MEDICAL HISTORY:  1.  Borderline hypertension.  2.  History of coronary artery disease with a previous MI.  3.  She has had breast cancer.  4.  Vulva cancer.  5.  She has a history of an irregular heart rate.  6.  Osteoporosis.   ALLERGIES:  No known drug allergies.   CURRENT PAIN MEDICATION:  Aleve p.r.n.   IMPRESSION/PLAN:  The patient presents today with her daughter to be seen in  followup by Dr. Corliss [name], approximately two weeks out from her recent T12  vertebroplasty.  The biopsy that was performed at that time was negative for  malignancy.  The patient and her daughter report that she is doing better,  although she still has a dull ache across the lower part of her back.  She  also reports pain in her hips.  She states her back pain is now a 5 or a 6  on a 1 to 10 scale.  It previously was a 10 on a 1 to 10 scale.  She takes  Aleve p.r.n. for the pain.  The patient's daughter reports that she is  getting around much better.  Her mobility has improved significantly.  She  ambulates without an assistive device.  Her incision sites on her back are  well healed, and she feels that she is improving, although she continues to  have pain in her low back as well as pain in the  trapezius area and pain in  the hips.   The patient did have a fall, on March 02, 2005, which is when she injured  her back.  The patient is concerned that she may have injured her hips at  the same time.  She does not believe that her pelvis was ever x-rayed and I  checked the computer.  I do not [name] any pelvic films.  I asked her to  discuss this with her primary care physician, Dr. Debby Bud, to [name] if he  thinks further x-rays are indicated.   Dr. Corliss [name] did not feel that the patient had a new compression fracture.  He felt that the pain in her spine was improving.  However, if she was still  having significant pain or increased pain in approximately two weeks he  would consider repeating the MRI of her spine to rule out a new compression  fracture.  Otherwise, he  asked her to gradually increase her activity but to  continue to avoid any severe bending, stooping, or twisting, or lifting more  than 10 to 20 pounds.   PLAN:  We will [name] the patient back on a p.r.n. basis.  If her pain in her  low back does not improve or becomes worse over the next 2-3 weeks, we will  consider a repeat MRI.  We have asked her to discuss her hip pain and her  trapezius pain with her primary care physician Dr. Debby Bud.      [name], P.A.    ______________________________  [name]. [name], M.D.    DR/MEDQ  D:  04/10/2005  T:  04/10/2005  Job:  [id number]   cc:   Rosalyn Gess. Norins, M.D. [facility]  [address]  [place]  [place] [address]

## 2010-09-19 NOTE — Consult Note (Signed)
NAMESANDER, SPECKMAN                       ACCOUNT NO.:  [id number]   MEDICAL RECORD NO.:  [id number]                   PATIENT TYPE:  OUT   LOCATION:  GYN                                  FACILITY:  [facility]   PHYSICIAN:  De Blanch, M.D.         DATE OF BIRTH:  19-Dec-1913   DATE OF CONSULTATION:  11/04/2002  DATE OF DISCHARGE:                                   CONSULTATION   HISTORY OF PRESENT ILLNESS:  This is an 77 year old white female seen in  consultation at the request of Dr. Randye Lobo regarding management of  Paget's disease of the vulva.  The patient underwent initial biopsy of a  nonhealing vulvar lesion with findings of Paget's disease.  She subsequently  had mapping biopsies and, following that, a wide local excision on October 15, 2002.  Final pathology showed that she had a focally close margin at 9  o'clock and a positive margin at 3 o'clock.  The patient has had an  uncomplicated postoperative course.   It was noted that in the course of her evaluation the patient was found to  have a breast mass and subsequently had a lumpectomy and sentinel node  biopsy at [facility] [place].  She tells me that her node biopsy was  positive.  She is yet to begin any additional therapy and wishes to have her  therapy administered here in [place].  Apparently her surgeon at  [facility] [place] has recommended that she receive radiation therapy.   Otherwise, workup prior to surgery had been negative for any evidence of  colon cancer or other malignancy.   PAST MEDICAL HISTORY:  Medical illnesses:  Breast cancer.   PAST SURGICAL HISTORY:  Hysterectomy and wide local excision of the vulva.   DRUG ALLERGIES:  None.   SOCIAL HISTORY:  The patient does not smoke.   FAMILY HISTORY:  Negative for gynecologic, breast, or colon cancers.   REVIEW OF SYSTEMS:  Essentially negative except as noted above.   PHYSICAL EXAMINATION:  VITAL SIGNS:  Height 5 feet 3  inches, weight 112.  Blood pressure 120/70, pulse 72, respiratory rate 18.  GENERAL:  Healthy, slender woman who is in no distress and appears younger  than stated age.  HEENT:  Negative.  NECK:  Supple.  Without thyromegaly.  There is no supraclavicular or  inguinal adenopathy.  ABDOMEN:  Soft, nontender.  No masses, organomegaly, ascites, or hernias are  noted.  PELVIC:  EGBUS shows the right partial vulvectomy is healing well.  There  still remains some suture present, but no lesions are noted.  The entire  vulvar and perianal areas inspected, and no other abnormalities are noted.  The vagina is atrophic, clean, well supported.  No lesions are noted.  Bimanual and rectovaginal exam reveal no masses, induration, or nodularity.   IMPRESSION:  Paget's disease of the vulva which has been widely excised.  One positive margin is noted.  RECOMMENDATIONS:  Given that the patient has no obvious disease, I do not  believe that any additional therapy is necessary at this juncture.  Certainly, achieving a negative margin with this disease process is very  difficult and, despite mapping biopsies, is common.  I would recommend the  patient be examined every six months to make certain there is no evidence of  recurrence and would use the strategy of excising obvious recurrences or  symptomatic recurrences at the time they reappear.  I think she needs no  other special testing or evaluation.  We will have her return to Dr. Conley Simmonds for these follow-up visits, but I would be happy to see her in the  future if necessary.   Addendum:  The patient is given the name of Dr. Chipper Herb in the  department of radiation oncology, who would be recommended to manage the  radiation aspects of her breast cancer.  Her surgeon at the [facility]  [place] will contact Dr. Dayton Scrape directly.                                               De Blanch, M.D.    DC/MEDQ  D:  11/04/2002  T:   11/05/2002  Job:  [id number]   cc:   Randye Lobo, M.D.  [address]  [place]  [place]  [address]  Fax: [phone]   Telford Nab, R.N.  [address]  [place], [place] [address]

## 2010-09-19 NOTE — Consult Note (Signed)
[name], [name]             ACCOUNT NO.:  [id number]   MEDICAL RECORD NO.:  [id number]          PATIENT TYPE:  OUT   LOCATION:  XRAY                         FACILITY:  [facility]   PHYSICIAN:  Sanjeev K. Deveshwar, M.D.DATE OF BIRTH:  11/19/1913   DATE OF CONSULTATION:  04/10/2005  DATE OF DISCHARGE:                                   CONSULTATION   ADDENDUM:  Greater than 30 minutes was spent on this consult today.      [name], P.A.    ______________________________  [name], M.D.    DR/MEDQ  D:  04/10/2005  T:  04/10/2005  Job:  [id number]

## 2010-09-19 NOTE — Consult Note (Signed)
[name], [name]             ACCOUNT NO.:  [id number]   MEDICAL RECORD NO.:  [id number]          PATIENT TYPE:  OUT   LOCATION:  XRAY                         FACILITY:  [facility]   PHYSICIAN:  Sanjeev K. Deveshwar, M.D.DATE OF BIRTH:  10-27-1913   DATE OF CONSULTATION:  DATE OF DISCHARGE:                                   CONSULTATION   CHIEF COMPLAINT:  Back pain.   HISTORY OF PRESENT ILLNESS:  This is a very pleasant 77 year old female  referred to Dr. Corliss Skains from Dr. Debby Bud after she bumped her head on a  staircase, fell to the ground, and injured her back. She has had a  significant amount of pain in her back since that time. The accident  occurred on March 02, 2005. She had an MRI of the spine on March 14, 2005, that showed an acute or subacute compression fracture at T12 with 50%  loss of height. There was also an enhancing lesion at L1 and a biopsy has  been recommended. She had mild stenosis at L3-L4, L4-L5. The patient  presents today with her son and daughter-in-law to discuss kyphoplasty or  possible vertebroplasty for the compression fracture.   PAST MEDICAL HISTORY:  The patient has been very healthy. She does have a  history of breast cancer. She had a lumpectomy as well as radiation therapy  in June 2004. She has had Paget's disease of the vulva that was excised in  June 2004. She has recently had some problems with constipation. Her blood  pressure has been running high recently, probably due to pain. She had no  previous history of hypertension. She had an MI in 2000 with a heart  catheterization performed by Dr. Chales Abrahams which the patient states showed no  significant stenosis. She had an upper endoscopy and colonoscopy in 2003  that revealed H. pylori. She has a history of osteoporosis with a previous  bone density study. She has had some problems with irregular heart rates in  the past.   SURGICAL HISTORY:  Significant for the breast lumpectomy in 2004,  as well as  the vulva excision due to Paget's disease.   ALLERGIES:  No known drug allergies.   CURRENT MEDICATIONS:  Include calcium, Aleve, a nasal spray for  osteoporosis, and ranitidine.   SOCIAL HISTORY:  The patient is widowed, she has three children. She lives  alone in [place]. She has been very independent. She quit smoking 20  years ago. She did smoke one-half to one pack of cigarettes per day for 40  years. She drinks alcohol occasionally. She is a retired Film/video editor.   FAMILY HISTORY:  Her mother died at age 85 from a CVA. Her father died at  age 2 from a motor vehicle accident.   IMPRESSION:  The patient and her family met with Dr. Corliss Skains today. He  reviewed the results of her MRI scan with her and her family. The  kyphoplasty and vertebroplasty procedures were described in detail, along  with the risks and benefits. Dr. Corliss Skains felt that the patient would need  to have a vertebroplasty rather than  a kyphoplasty due to the retropulsion  of the fracture near the spinal cord. He also agreed with the recommendation  that she have a  biopsy performed due to the abnormalities noted at L1 and several other  vertebra. The patient and her family are anxious to proceed. She has been  scheduled for next Tuesday, March 27, 2005.   Greater than 40 minutes were spent on this consult.      Delton See, P.A.    ______________________________  Grandville Silos. Corliss Skains, M.D.    DR/MEDQ  D:  03/23/2005  T:  03/23/2005  Job:  [id number]   cc:   Rosalyn Gess. Norins, M.D. [facility]  [address]  [place]  [place] [address]

## 2010-09-19 NOTE — Op Note (Signed)
[name], [name]                       ACCOUNT NO.:  [id number]   MEDICAL RECORD NO.:  [id number]                   PATIENT TYPE:  AMB   LOCATION:  SDC                                  FACILITY:  [facility]   PHYSICIAN:  Randye Lobo, M.D.                DATE OF BIRTH:  1913/07/01   DATE OF PROCEDURE:  10/15/2002  DATE OF DISCHARGE:  10/15/2002                                 OPERATIVE REPORT   PREOPERATIVE DIAGNOSIS:  Paget's disease of the vulva.   POSTOPERATIVE DIAGNOSIS:  Paget's disease of the vulva.   PROCEDURE:  Excision of right labia majora vulvar lesion.   SURGEON:  Randye Lobo, M.D.   ANESTHESIA:  General endotracheal.   ESTIMATED BLOOD LOSS:  Minimal.   URINE OUTPUT:  150 mL per I&O catheter prior to procedure.   COMPLICATIONS:  None.   INDICATIONS FOR PROCEDURE:  The patient is an 77 year old Caucasian female  who presented to the office with a nonhealing excoriation of the right vulva  of several months' duration.  A biopsy was performed in the office which  documented extramammary Paget's disease.  The patient did undergo mapping  biopsies in the office to determine the margins of resection.  Of note, the  patient had a colonoscopy and an endoscopy performed in the fall of 2003.  This documented H. pylori as well as a benign colonic polyp.  As part of a  preoperative evaluation the patient did have a clinical breast exam  documenting a mass in the left breast.  This was biopsied and found to be  invasive carcinoma.  The patient subsequently underwent a lumpectomy with  sentinel node mapping at the [facility] [place].  After she returned  from her breast surgery, she chose to proceed with excision of the vulvar  lesion after the risks, benefits, and alternatives were discussed with her.   FINDINGS:  Examination under anesthesia revealed a 1.0 x 0.75 cm velvety,  pink-red lesion of the right vulva.  No other lesions of the vulva were  appreciated.   Bimanual examination documented the absence of the cervix and  uterus.  No adnexal masses were appreciated.   SPECIMENS:  The vulvar specimen was sent to pathology marked with a silk  suture at the 12 o'clock position.   PROCEDURE:  With an IV in place, the patient was taken to the operating room  after she was properly identified.  The patient did receive Ancef 1 gram  intravenously for antibiotic prophylaxis.  The patient was placed in the  supine position on the operating room table, and general anesthesia was  induced.  She was then placed in the dorsal lithotomy position and the vulva  and vagina were prepped and then sterilely draped.  The patient was  sterilely catheterized of urine.  The patient was examined and the findings  were as noted above.  The previously noted sites  of mapping biopsies were  again appreciated, and an elliptical shaped specimen was outlined with a  marking pen.  The tissue was sharply excised using a scalpel.  This did  include the dermis and the epidermis.  The dissection was carried down to  the level of the subcutaneous fat.  The edges of the excision site were  coagulated with monopolar cautery as well as veins at the base of the  operative field.  Hemostasis was noted to be excellent.  The skin was  undermined laterally on both sides to remove any tension for closure of the  wound.  A deep layer of interrupted and figure-of-eight sutures of 0 Vicryl  were placed in the subcutaneous tissue.  The skin was brought together with  a series of vertical mattress sutures of 2-0 Vicryl.  Hemostasis was  excellent at the end of the procedure.  There were no complications.  The  patient was awakened and extubated and sent to the recovery room in stable  condition.  All sponge, instrument, and needle counts were correct.                                               Randye Lobo, M.D.    BES/MEDQ  D:  10/17/2002  T:  10/17/2002  Job:  [id number]

## 2010-10-04 ENCOUNTER — Encounter (HOSPITAL_BASED_OUTPATIENT_CLINIC_OR_DEPARTMENT_OTHER): Payer: Medicare Other | Admitting: Oncology

## 2010-10-04 ENCOUNTER — Other Ambulatory Visit: Payer: Self-pay | Admitting: Oncology

## 2010-10-04 DIAGNOSIS — Z5112 Encounter for antineoplastic immunotherapy: Secondary | ICD-10-CM

## 2010-10-04 DIAGNOSIS — C50519 Malignant neoplasm of lower-outer quadrant of unspecified female breast: Secondary | ICD-10-CM

## 2010-10-04 DIAGNOSIS — C7951 Secondary malignant neoplasm of bone: Secondary | ICD-10-CM

## 2010-10-04 LAB — CBC WITH DIFFERENTIAL/PLATELET
BASO%: 0.7 % (ref 0.0–2.0)
Basophils Absolute: 0 10*3/uL (ref 0.0–0.1)
EOS%: 5.2 % (ref 0.0–7.0)
HCT: 44.3 % (ref 34.8–46.6)
HGB: 14.5 g/dL (ref 11.6–15.9)
LYMPH%: 23.3 % (ref 14.0–49.7)
MCH: 30 pg (ref 25.1–34.0)
MCHC: 32.7 g/dL (ref 31.5–36.0)
MONO#: 0.5 10*3/uL (ref 0.1–0.9)
NEUT%: 58.9 % (ref 38.4–76.8)
Platelets: 146 10*3/uL (ref 145–400)
lymph#: 1 10*3/uL (ref 0.9–3.3)

## 2010-10-25 ENCOUNTER — Other Ambulatory Visit: Payer: Self-pay | Admitting: Oncology

## 2010-10-25 ENCOUNTER — Encounter (HOSPITAL_BASED_OUTPATIENT_CLINIC_OR_DEPARTMENT_OTHER): Payer: Medicare Other | Admitting: Oncology

## 2010-10-25 DIAGNOSIS — C7951 Secondary malignant neoplasm of bone: Secondary | ICD-10-CM

## 2010-10-25 DIAGNOSIS — C50519 Malignant neoplasm of lower-outer quadrant of unspecified female breast: Secondary | ICD-10-CM

## 2010-10-25 DIAGNOSIS — Z5112 Encounter for antineoplastic immunotherapy: Secondary | ICD-10-CM

## 2010-10-25 LAB — CBC WITH DIFFERENTIAL/PLATELET
Basophils Absolute: 0 10*3/uL (ref 0.0–0.1)
EOS%: 3.9 % (ref 0.0–7.0)
Eosinophils Absolute: 0.2 10*3/uL (ref 0.0–0.5)
HGB: 14.1 g/dL (ref 11.6–15.9)
LYMPH%: 23.4 % (ref 14.0–49.7)
MCH: 29.8 pg (ref 25.1–34.0)
MCV: 92.6 fL (ref 79.5–101.0)
MONO%: 12.4 % (ref 0.0–14.0)
NEUT#: 2.6 10*3/uL (ref 1.5–6.5)
NEUT%: 59.4 % (ref 38.4–76.8)
Platelets: 121 10*3/uL — ABNORMAL LOW (ref 145–400)
RDW: 13.6 % (ref 11.2–14.5)

## 2010-10-25 LAB — BASIC METABOLIC PANEL
BUN: 12 mg/dL (ref 6–23)
CO2: 24 mEq/L (ref 19–32)
Calcium: 9.1 mg/dL (ref 8.4–10.5)
Creatinine, Ser: 0.89 mg/dL (ref 0.50–1.10)
Glucose, Bld: 121 mg/dL — ABNORMAL HIGH (ref 70–99)

## 2010-10-30 ENCOUNTER — Encounter: Payer: Self-pay | Admitting: Internal Medicine

## 2010-10-30 ENCOUNTER — Ambulatory Visit (INDEPENDENT_AMBULATORY_CARE_PROVIDER_SITE_OTHER): Payer: Medicare Other | Admitting: Internal Medicine

## 2010-10-30 DIAGNOSIS — R030 Elevated blood-pressure reading, without diagnosis of hypertension: Secondary | ICD-10-CM

## 2010-10-30 NOTE — Patient Instructions (Signed)
Itchy red lesions - ok to use rubbing alcohol or low strength over the counter cortisone. Also, for itching may take generic claritin 10mg  twice a day and if this doesn't work well enough you can also take over the counter Ranitidine 75 mg twice a day. I suspect that this is a mild allergic reaction vs inset bites (especially those on the legs).  Use of metoprolol - a beta blocker used for the treatment of blood pressure and to control heart rate. This drug also helps maximize cardiac function. With your blood pressure being variable I recommend continuing.  Mental status - absolutely no evidence of any dementia or cognitive decline. The best protection is to remain intellectually active: read books, have discussions, be engaged. Not something you need to worry about.  Call any time that I can be of service.

## 2010-10-30 NOTE — Progress Notes (Signed)
  Subjective:    Patient ID: [name], female    DOB: [birth year], 77 y.o.   MRN: [id number]  HPI Mrs. Hutmacher presents for follow-up. Her chief question is whether she needs to continue to take metoprolol. BP has been variable but generally well controlled. She has 2 D echo q 2 months due to being on Herceptin with last three ( Dec, Feb and May) with normal EF at 55-60% and no wall motion abnormality. She has had no cardiac symptoms. She is now able to walk 3 miles a day.   She is also concerned about small erythematous raised lesions that are pruritic. She does get relief from use of rubbing alcohol.   She is doing very well in regard to her recurrent breast cancer and is on Herceptin.   Reivewed medical history from centricity   Review of Systems Review of Systems  Constitutional:  Negative for fever, chills, activity change and unexpected weight change.  HEENT:  Negative for hearing loss, ear pain, congestion, neck stiffness and postnasal drip. Negative for sore throat or swallowing problems. Negative for dental complaints.   Eyes: Negative for vision loss or change in visual acuity.  Respiratory: Negative for chest tightness and wheezing.   Cardiovascular: Negative for chest pain and palpitation. No decreased exercise tolerance Gastrointestinal: No change in bowel habit. No bloating or gas. No reflux or indigestion Genitourinary: Negative for urgency, frequency, flank pain and difficulty urinating.  Musculoskeletal: Negative for myalgias, back pain, arthralgias and gait problem.  Neurological: Negative for dizziness, tremors, weakness and headaches.  Hematological: Negative for adenopathy.  Psychiatric/Behavioral: Negative for behavioral problems and dysphoric mood.       Objective:   Physical Exam Vitals noted - stable Gen'l - a very slender white woman who looks remarkably good for her age and condition. HEENT C&S clear Pulmonary - normal reespirations Cor - 2+ radial  pulse, RRR Derm - isolated small, .5 to 1 cm, erythematous macules on the distal left leg. No lesions noted on the proximal UE.        Assessment & Plan:  Rash - pruritic macular lesions that do not look typical for urticaria.  Plan - may use topical treatment of choice, e.g. Rubbing alcohol           For pruritis - H1 blockere of choice, e.g. claritin plus H2 blocker if needed to control symptoms.

## 2010-10-31 NOTE — Assessment & Plan Note (Signed)
BP Readings from Last 3 Encounters:  10/30/10 140/78  02/23/10 152/76  01/24/10 102/60   She is doing generally fine. Recommend continuing B-blocker therapy

## 2010-11-08 ENCOUNTER — Telehealth: Payer: Self-pay | Admitting: *Deleted

## 2010-11-08 DIAGNOSIS — R21 Rash and other nonspecific skin eruption: Secondary | ICD-10-CM

## 2010-11-08 NOTE — Telephone Encounter (Signed)
Pt states that when she first became a patient w/MD that "she was assured that she could always pick up the phone and call & that she would get the attention she needed". Pt wants callback.

## 2010-11-08 NOTE — Telephone Encounter (Signed)
Unlikely to be quinide. OK for derm consult will ask PCCs to refer to Dr. Terri Piedra hopefully for appt tomorrow. Continue the loratadine and ranitidine for itching.

## 2010-11-08 NOTE — Telephone Encounter (Signed)
Patient informed - PCC's notified to expedite referral.

## 2010-11-08 NOTE — Telephone Encounter (Signed)
Pt c/o new skin "outbreaks coming in clusters" since last OV; described as "miserable, itchy rash". Pt is inquiring as to if the Quinide that she has been using for nocturnal leg cramps could build-up in her system and cause an issue such as this.? Pt wanting to get referral to Dermatologist if you agree Andrey Cota that she does not need a prior authorization for the referral], and advice on what she could add to her regimen as of now to help with the rash adverse effects until she could get in to see the dermatologist.?

## 2010-11-15 ENCOUNTER — Other Ambulatory Visit: Payer: Self-pay | Admitting: Oncology

## 2010-11-15 ENCOUNTER — Encounter (HOSPITAL_BASED_OUTPATIENT_CLINIC_OR_DEPARTMENT_OTHER): Payer: Medicare Other | Admitting: Oncology

## 2010-11-15 DIAGNOSIS — Z5111 Encounter for antineoplastic chemotherapy: Secondary | ICD-10-CM

## 2010-11-15 DIAGNOSIS — C50519 Malignant neoplasm of lower-outer quadrant of unspecified female breast: Secondary | ICD-10-CM

## 2010-11-15 LAB — CBC WITH DIFFERENTIAL/PLATELET
Eosinophils Absolute: 0.2 10*3/uL (ref 0.0–0.5)
HGB: 13.6 g/dL (ref 11.6–15.9)
MCV: 91.2 fL (ref 79.5–101.0)
MONO%: 12.3 % (ref 0.0–14.0)
NEUT#: 2.5 10*3/uL (ref 1.5–6.5)
RBC: 4.54 10*6/uL (ref 3.70–5.45)
RDW: 13.8 % (ref 11.2–14.5)
WBC: 4.4 10*3/uL (ref 3.9–10.3)
lymph#: 1.1 10*3/uL (ref 0.9–3.3)
nRBC: 0 % (ref 0–0)

## 2010-11-15 LAB — BASIC METABOLIC PANEL
CO2: 22 mEq/L (ref 19–32)
Calcium: 8.9 mg/dL (ref 8.4–10.5)
Potassium: 3.8 mEq/L (ref 3.5–5.3)
Sodium: 143 mEq/L (ref 135–145)

## 2010-12-06 ENCOUNTER — Other Ambulatory Visit: Payer: Self-pay | Admitting: Oncology

## 2010-12-06 ENCOUNTER — Encounter (HOSPITAL_BASED_OUTPATIENT_CLINIC_OR_DEPARTMENT_OTHER): Payer: Medicare Other | Admitting: Oncology

## 2010-12-06 DIAGNOSIS — C50519 Malignant neoplasm of lower-outer quadrant of unspecified female breast: Secondary | ICD-10-CM

## 2010-12-06 DIAGNOSIS — C7951 Secondary malignant neoplasm of bone: Secondary | ICD-10-CM

## 2010-12-06 DIAGNOSIS — Z5112 Encounter for antineoplastic immunotherapy: Secondary | ICD-10-CM

## 2010-12-06 LAB — CBC WITH DIFFERENTIAL/PLATELET
BASO%: 0.6 % (ref 0.0–2.0)
Eosinophils Absolute: 0.2 10*3/uL (ref 0.0–0.5)
MCHC: 32.5 g/dL (ref 31.5–36.0)
MCV: 93.8 fL (ref 79.5–101.0)
MONO#: 0.5 10*3/uL (ref 0.1–0.9)
MONO%: 10.8 % (ref 0.0–14.0)
NEUT#: 2.8 10*3/uL (ref 1.5–6.5)
RBC: 4.66 10*6/uL (ref 3.70–5.45)
RDW: 14.2 % (ref 11.2–14.5)
WBC: 4.8 10*3/uL (ref 3.9–10.3)
nRBC: 0 % (ref 0–0)

## 2010-12-06 LAB — BASIC METABOLIC PANEL
CO2: 27 mEq/L (ref 19–32)
Calcium: 10.2 mg/dL (ref 8.4–10.5)
Creatinine, Ser: 0.98 mg/dL (ref 0.50–1.10)
Sodium: 140 mEq/L (ref 135–145)

## 2010-12-27 ENCOUNTER — Ambulatory Visit (HOSPITAL_COMMUNITY)
Admission: RE | Admit: 2010-12-27 | Discharge: 2010-12-27 | Disposition: A | Discharge status: Home / Self Care | Payer: Medicare Other | Source: Ambulatory Visit | Attending: Oncology | Admitting: Oncology

## 2010-12-27 ENCOUNTER — Encounter (HOSPITAL_BASED_OUTPATIENT_CLINIC_OR_DEPARTMENT_OTHER): Payer: Medicare Other | Admitting: Oncology

## 2010-12-27 ENCOUNTER — Other Ambulatory Visit: Payer: Self-pay | Admitting: Oncology

## 2010-12-27 DIAGNOSIS — I369 Nonrheumatic tricuspid valve disorder, unspecified: Secondary | ICD-10-CM

## 2010-12-27 DIAGNOSIS — C50519 Malignant neoplasm of lower-outer quadrant of unspecified female breast: Secondary | ICD-10-CM

## 2010-12-27 DIAGNOSIS — C50919 Malignant neoplasm of unspecified site of unspecified female breast: Secondary | ICD-10-CM | POA: Insufficient documentation

## 2010-12-27 DIAGNOSIS — C7951 Secondary malignant neoplasm of bone: Secondary | ICD-10-CM

## 2010-12-27 DIAGNOSIS — I079 Rheumatic tricuspid valve disease, unspecified: Secondary | ICD-10-CM | POA: Insufficient documentation

## 2010-12-27 DIAGNOSIS — Z5112 Encounter for antineoplastic immunotherapy: Secondary | ICD-10-CM

## 2010-12-27 DIAGNOSIS — Z09 Encounter for follow-up examination after completed treatment for conditions other than malignant neoplasm: Secondary | ICD-10-CM | POA: Insufficient documentation

## 2010-12-27 DIAGNOSIS — I080 Rheumatic disorders of both mitral and aortic valves: Secondary | ICD-10-CM | POA: Insufficient documentation

## 2010-12-27 DIAGNOSIS — I10 Essential (primary) hypertension: Secondary | ICD-10-CM | POA: Insufficient documentation

## 2010-12-27 LAB — BASIC METABOLIC PANEL
BUN: 19 mg/dL (ref 6–23)
Chloride: 104 mEq/L (ref 96–112)
Creatinine, Ser: 0.97 mg/dL (ref 0.50–1.10)
Glucose, Bld: 83 mg/dL (ref 70–99)

## 2010-12-27 LAB — CBC WITH DIFFERENTIAL/PLATELET
BASO%: 0.8 % (ref 0.0–2.0)
Basophils Absolute: 0 10*3/uL (ref 0.0–0.1)
EOS%: 5.3 % (ref 0.0–7.0)
HCT: 44 % (ref 34.8–46.6)
LYMPH%: 21.9 % (ref 14.0–49.7)
MCH: 30.3 pg (ref 25.1–34.0)
MCHC: 33 g/dL (ref 31.5–36.0)
MONO#: 0.7 10*3/uL (ref 0.1–0.9)
NEUT%: 59.5 % (ref 38.4–76.8)
Platelets: 141 10*3/uL — ABNORMAL LOW (ref 145–400)

## 2011-01-11 ENCOUNTER — Other Ambulatory Visit: Payer: Self-pay | Admitting: Internal Medicine

## 2011-01-12 ENCOUNTER — Other Ambulatory Visit: Payer: Self-pay | Admitting: Internal Medicine

## 2011-01-17 ENCOUNTER — Ambulatory Visit (HOSPITAL_COMMUNITY): Payer: Medicare Other | Attending: Oncology

## 2011-01-17 ENCOUNTER — Other Ambulatory Visit: Payer: Self-pay | Admitting: Oncology

## 2011-01-17 ENCOUNTER — Encounter (HOSPITAL_BASED_OUTPATIENT_CLINIC_OR_DEPARTMENT_OTHER): Payer: Medicare Other | Admitting: Oncology

## 2011-01-17 DIAGNOSIS — C7951 Secondary malignant neoplasm of bone: Secondary | ICD-10-CM

## 2011-01-17 DIAGNOSIS — Z5112 Encounter for antineoplastic immunotherapy: Secondary | ICD-10-CM

## 2011-01-17 DIAGNOSIS — C50519 Malignant neoplasm of lower-outer quadrant of unspecified female breast: Secondary | ICD-10-CM

## 2011-01-17 DIAGNOSIS — Z5111 Encounter for antineoplastic chemotherapy: Secondary | ICD-10-CM

## 2011-01-17 LAB — COMPREHENSIVE METABOLIC PANEL
ALT: 16 U/L (ref 0–35)
Albumin: 4.6 g/dL (ref 3.5–5.2)
Alkaline Phosphatase: 46 U/L (ref 39–117)
CO2: 25 mEq/L (ref 19–32)
Glucose, Bld: 78 mg/dL (ref 70–99)
Potassium: 4.2 mEq/L (ref 3.5–5.3)
Sodium: 141 mEq/L (ref 135–145)
Total Bilirubin: 0.5 mg/dL (ref 0.3–1.2)
Total Protein: 7.6 g/dL (ref 6.0–8.3)

## 2011-01-17 LAB — CBC WITH DIFFERENTIAL/PLATELET
BASO%: 0.4 % (ref 0.0–2.0)
EOS%: 3.4 % (ref 0.0–7.0)
Eosinophils Absolute: 0.2 10*3/uL (ref 0.0–0.5)
LYMPH%: 19.1 % (ref 14.0–49.7)
MCHC: 32.7 g/dL (ref 31.5–36.0)
MCV: 92.1 fL (ref 79.5–101.0)
MONO%: 8.2 % (ref 0.0–14.0)
NEUT#: 3.9 10*3/uL (ref 1.5–6.5)
RBC: 4.58 10*6/uL (ref 3.70–5.45)
RDW: 14.2 % (ref 11.2–14.5)
nRBC: 0 % (ref 0–0)

## 2011-02-07 ENCOUNTER — Other Ambulatory Visit: Payer: Self-pay | Admitting: Oncology

## 2011-02-07 ENCOUNTER — Encounter (HOSPITAL_BASED_OUTPATIENT_CLINIC_OR_DEPARTMENT_OTHER): Payer: Medicare Other | Admitting: Oncology

## 2011-02-07 DIAGNOSIS — Z5111 Encounter for antineoplastic chemotherapy: Secondary | ICD-10-CM

## 2011-02-07 DIAGNOSIS — C50519 Malignant neoplasm of lower-outer quadrant of unspecified female breast: Secondary | ICD-10-CM

## 2011-02-07 DIAGNOSIS — C7951 Secondary malignant neoplasm of bone: Secondary | ICD-10-CM

## 2011-02-07 DIAGNOSIS — Z5112 Encounter for antineoplastic immunotherapy: Secondary | ICD-10-CM

## 2011-02-07 LAB — CBC WITH DIFFERENTIAL/PLATELET
BASO%: 1 % (ref 0.0–2.0)
EOS%: 5.2 % (ref 0.0–7.0)
HCT: 41.8 % (ref 34.8–46.6)
LYMPH%: 26.4 % (ref 14.0–49.7)
MCH: 30 pg (ref 25.1–34.0)
MCHC: 32.8 g/dL (ref 31.5–36.0)
MONO#: 0.5 10*3/uL (ref 0.1–0.9)
MONO%: 12.8 % (ref 0.0–14.0)
NEUT%: 54.6 % (ref 38.4–76.8)
Platelets: 152 10*3/uL (ref 145–400)
RBC: 4.57 10*6/uL (ref 3.70–5.45)
WBC: 4.2 10*3/uL (ref 3.9–10.3)
nRBC: 0 % (ref 0–0)

## 2011-02-07 LAB — BASIC METABOLIC PANEL
BUN: 18 mg/dL (ref 6–23)
Calcium: 9.9 mg/dL (ref 8.4–10.5)
Chloride: 108 mEq/L (ref 96–112)
Creatinine, Ser: 1.03 mg/dL (ref 0.50–1.10)

## 2011-02-09 ENCOUNTER — Encounter: Payer: Self-pay | Admitting: Internal Medicine

## 2011-02-09 ENCOUNTER — Ambulatory Visit (INDEPENDENT_AMBULATORY_CARE_PROVIDER_SITE_OTHER): Payer: Medicare Other | Admitting: Internal Medicine

## 2011-02-09 VITALS — BP 174/90 | HR 47 | Wt 107.0 lb

## 2011-02-09 DIAGNOSIS — M79609 Pain in unspecified limb: Secondary | ICD-10-CM

## 2011-02-09 DIAGNOSIS — Z853 Personal history of malignant neoplasm of breast: Secondary | ICD-10-CM

## 2011-02-09 DIAGNOSIS — C50919 Malignant neoplasm of unspecified site of unspecified female breast: Secondary | ICD-10-CM

## 2011-02-09 DIAGNOSIS — R030 Elevated blood-pressure reading, without diagnosis of hypertension: Secondary | ICD-10-CM

## 2011-02-09 NOTE — Progress Notes (Signed)
  Subjective:    Patient ID: [name], female    DOB: 02/27/1914, 77 y.o.   MRN: [id number]  HPI Mrs. Mix presents for pain in the right leg to the extent that she cannot walk. She is also having pain in the left chest wall - tender to the touch. She has had breast cancer with two recurrences and is on chronic treatment w/ herceptin. She is concerned, reluctantly, that this may represent metastatic disease.  Past Medical History  Diagnosis Date  . Cancer     breast  . Cataract   . CAD (coronary artery disease)     cath w/ trivial disease  . Diverticulosis of colon   . Osteoporosis     s/p compression fx T12   Past Surgical History  Procedure Date  . Abdominal hysterectomy   . Breast lumpectomy   . Excision for paget's vulvar   . Vertbroplasty     T12   No family history on file. History   Social History  . Marital Status: Widowed    Spouse Name: N/A    Number of Children: N/A  . Years of Education: N/A   Occupational History  . Not on file.   Social History Main Topics  . Smoking status: Former Smoker    Quit date: 05/01/1990  . Smokeless tobacco: Never Used  . Alcohol Use: No  . Drug Use: No  . Sexually Active: No   Other Topics Concern  . Not on file   Social History Narrative   HSG, no college. Married '44- '97 widowed. 2 dtrs - '46, '54, 1 son - '49; 6 grandchildren; 5 great-grands. 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 She lives alone. End of life care: Do not resuscitate; willing to have intensive care excluding mechanical ventilation.        Review of Systems System review is negative for any constitutional, cardiac, pulmonary, GI or neuro symptoms or complaints other than as described in the HPI.     Objective:   Physical Exam Vitals reviewed - stable except for BP elevation today. Gen'l - an older white woman who is uncomfortable but not in acute distress. She looks younger than her chronologic age. Chest -  very tender to palpation left chest wall Pulm - normal respirations Cor - RRR        Assessment & Plan:

## 2011-02-09 NOTE — Patient Instructions (Signed)
Chest wall and leg pain - concern is for metastatic disease to bone. Plan - whole body bone scan. For pain control - please take tylenol 500 mg three times a day on schedule and this can be advanced to 1000 mg three times a day. If this does not give you adequate relief we can talk about other medications.

## 2011-02-12 NOTE — Assessment & Plan Note (Signed)
Has been doing well with herceptin but now presenting with pain in the long bones of her right leg and pain in the chest wall worrisome for bony metastatic disease vs paget's disease.  Plan - whole body bone scan.            She doesn't want to take pain medications but is encouraged to take APAP 1000 mg tid.

## 2011-02-12 NOTE — Assessment & Plan Note (Signed)
BP Readings from Last 3 Encounters:  02/09/11 174/90  10/30/10 140/78  02/23/10 152/76   BP rather high today and may be due to pain.  Plan - will continue present regimen

## 2011-02-19 ENCOUNTER — Encounter (HOSPITAL_COMMUNITY)
Admission: RE | Admit: 2011-02-19 | Discharge: 2011-02-19 | Disposition: A | Discharge status: Home / Self Care | Payer: Medicare Other | Source: Ambulatory Visit | Attending: Internal Medicine | Admitting: Internal Medicine

## 2011-02-19 ENCOUNTER — Ambulatory Visit (HOSPITAL_COMMUNITY)
Admission: RE | Admit: 2011-02-19 | Discharge: 2011-02-19 | Disposition: A | Discharge status: Home / Self Care | Payer: Medicare Other | Source: Ambulatory Visit | Attending: Internal Medicine | Admitting: Internal Medicine

## 2011-02-19 ENCOUNTER — Other Ambulatory Visit: Payer: Self-pay | Admitting: Internal Medicine

## 2011-02-19 DIAGNOSIS — R52 Pain, unspecified: Secondary | ICD-10-CM

## 2011-02-19 DIAGNOSIS — Z853 Personal history of malignant neoplasm of breast: Secondary | ICD-10-CM | POA: Insufficient documentation

## 2011-02-19 DIAGNOSIS — R079 Chest pain, unspecified: Secondary | ICD-10-CM | POA: Insufficient documentation

## 2011-02-19 DIAGNOSIS — M79609 Pain in unspecified limb: Secondary | ICD-10-CM | POA: Insufficient documentation

## 2011-02-19 DIAGNOSIS — M948X9 Other specified disorders of cartilage, unspecified sites: Secondary | ICD-10-CM | POA: Insufficient documentation

## 2011-02-19 DIAGNOSIS — I708 Atherosclerosis of other arteries: Secondary | ICD-10-CM | POA: Insufficient documentation

## 2011-02-19 DIAGNOSIS — C50919 Malignant neoplasm of unspecified site of unspecified female breast: Secondary | ICD-10-CM

## 2011-02-19 DIAGNOSIS — Z9010 Acquired absence of unspecified breast and nipple: Secondary | ICD-10-CM | POA: Insufficient documentation

## 2011-02-19 DIAGNOSIS — M4180 Other forms of scoliosis, site unspecified: Secondary | ICD-10-CM | POA: Insufficient documentation

## 2011-02-19 MED ORDER — TECHNETIUM TC 99M MEDRONATE IV KIT
24.0000 | PACK | Freq: Once | INTRAVENOUS | Status: AC | PRN
  Administered 2011-02-19: 24 via INTRAVENOUS

## 2011-02-20 ENCOUNTER — Telehealth: Payer: Self-pay | Admitting: Internal Medicine

## 2011-02-20 NOTE — Telephone Encounter (Signed)
Please call patient - bone scan and x-ray suggest probable occult rib fracture, not metastatic disease. A good thing.

## 2011-02-21 NOTE — Telephone Encounter (Signed)
Informed pt of results.

## 2011-02-26 ENCOUNTER — Ambulatory Visit (INDEPENDENT_AMBULATORY_CARE_PROVIDER_SITE_OTHER): Payer: Medicare Other | Admitting: Internal Medicine

## 2011-02-26 DIAGNOSIS — R030 Elevated blood-pressure reading, without diagnosis of hypertension: Secondary | ICD-10-CM

## 2011-02-26 DIAGNOSIS — K219 Gastro-esophageal reflux disease without esophagitis: Secondary | ICD-10-CM

## 2011-02-26 DIAGNOSIS — I2510 Atherosclerotic heart disease of native coronary artery without angina pectoris: Secondary | ICD-10-CM

## 2011-02-26 MED ORDER — METOPROLOL SUCCINATE ER 50 MG PO TB24: 50.0000 mg | ORAL_TABLET | Freq: Every day | ORAL | Status: DC

## 2011-02-26 NOTE — Assessment & Plan Note (Signed)
Reviewed several 2 D echos - well preserved EF 55-60% with no wall motion abnormalities.

## 2011-02-26 NOTE — Assessment & Plan Note (Signed)
Increase in eructation most likely due to acid dypspepsia  Plan - Mylanta II or similar product as needed.

## 2011-02-26 NOTE — Progress Notes (Signed)
  Subjective:    Patient ID: [name], female    DOB: [birth year], 77 y.o.   MRN: [id number]  HPI Mrs. Rodenberg has many questions: 1. Bone scan - reviwed images and report - probably occult fracture left 10th rib 2. Reviewed  Last two 2 D echos  Normal with EF 55%, known aortic and mitral regurgitation 3. Still with leg pain - reassured her that there was no abnormality of the legs on the bone scan. 4 Increased eructation - most likely due to acid dyspepsia.  I have reviewed the patient's medical history in detail and updated the computerized patient record.    Review of Systems System review is negative for any constitutional, cardiac, pulmonary, GI or neuro symptoms or complaints other than as described in the HPI.     Objective:   Physical Exam Vitals - noted and stable Gen'l - thin, older white woman in no distress, in good spirits with a sparkle in her eye. Pulm - no increased work of breathing,  Normal respirations Cor- RRR MSK - normal ambulation, normal range of motion       Assessment & Plan:  Chest wall pain - most likely an occult 10th rib fracture.  Plan- ok to resume activity.

## 2011-02-26 NOTE — Patient Instructions (Signed)
Bone scan - no evidence of metastasis. Probable 10 th rib fracture. Legs were fine.   Eructation - increased burping - may be due to increased acid and mild dypsepsia. Plan - take Mylanta II for periods of gas.   Will change Rx to 90 days.

## 2011-02-27 NOTE — Assessment & Plan Note (Signed)
BP Readings from Last 3 Encounters:  02/26/11 138/80  02/09/11 174/90  10/30/10 140/78   BP seems to be under better control at this visit. Continue present medications.

## 2011-02-28 ENCOUNTER — Other Ambulatory Visit: Payer: Self-pay | Admitting: Oncology

## 2011-02-28 ENCOUNTER — Encounter (HOSPITAL_BASED_OUTPATIENT_CLINIC_OR_DEPARTMENT_OTHER): Payer: Medicare Other | Admitting: Oncology

## 2011-02-28 DIAGNOSIS — C7952 Secondary malignant neoplasm of bone marrow: Secondary | ICD-10-CM

## 2011-02-28 DIAGNOSIS — Z5111 Encounter for antineoplastic chemotherapy: Secondary | ICD-10-CM

## 2011-02-28 DIAGNOSIS — C50519 Malignant neoplasm of lower-outer quadrant of unspecified female breast: Secondary | ICD-10-CM

## 2011-02-28 DIAGNOSIS — Z5112 Encounter for antineoplastic immunotherapy: Secondary | ICD-10-CM

## 2011-02-28 LAB — BASIC METABOLIC PANEL
BUN: 11 mg/dL (ref 6–23)
Chloride: 104 mEq/L (ref 96–112)
Potassium: 5.7 mEq/L — ABNORMAL HIGH (ref 3.5–5.3)
Sodium: 139 mEq/L (ref 135–145)

## 2011-02-28 LAB — CBC WITH DIFFERENTIAL/PLATELET
Basophils Absolute: 0.1 10*3/uL (ref 0.0–0.1)
EOS%: 4.1 % (ref 0.0–7.0)
Eosinophils Absolute: 0.2 10*3/uL (ref 0.0–0.5)
HCT: 43.5 % (ref 34.8–46.6)
HGB: 14.2 g/dL (ref 11.6–15.9)
MCH: 30.3 pg (ref 25.1–34.0)
MCV: 92.8 fL (ref 79.5–101.0)
MONO%: 13.3 % (ref 0.0–14.0)
NEUT#: 3.2 10*3/uL (ref 1.5–6.5)
NEUT%: 59.9 % (ref 38.4–76.8)
Platelets: 155 10*3/uL (ref 145–400)
RDW: 14.3 % (ref 11.2–14.5)

## 2011-03-20 ENCOUNTER — Encounter: Payer: Self-pay | Admitting: *Deleted

## 2011-03-21 ENCOUNTER — Other Ambulatory Visit: Payer: Self-pay | Admitting: Oncology

## 2011-03-21 ENCOUNTER — Ambulatory Visit (HOSPITAL_BASED_OUTPATIENT_CLINIC_OR_DEPARTMENT_OTHER): Payer: Medicare Other

## 2011-03-21 ENCOUNTER — Telehealth: Payer: Self-pay | Admitting: *Deleted

## 2011-03-21 ENCOUNTER — Other Ambulatory Visit (HOSPITAL_BASED_OUTPATIENT_CLINIC_OR_DEPARTMENT_OTHER): Payer: Medicare Other

## 2011-03-21 ENCOUNTER — Ambulatory Visit (HOSPITAL_BASED_OUTPATIENT_CLINIC_OR_DEPARTMENT_OTHER): Payer: Medicare Other | Admitting: Oncology

## 2011-03-21 VITALS — BP 177/81 | HR 48 | Temp 96.2°F | Ht 61.5 in | Wt 111.3 lb

## 2011-03-21 DIAGNOSIS — C7951 Secondary malignant neoplasm of bone: Secondary | ICD-10-CM

## 2011-03-21 DIAGNOSIS — C50519 Malignant neoplasm of lower-outer quadrant of unspecified female breast: Secondary | ICD-10-CM

## 2011-03-21 DIAGNOSIS — C50919 Malignant neoplasm of unspecified site of unspecified female breast: Secondary | ICD-10-CM

## 2011-03-21 DIAGNOSIS — C7952 Secondary malignant neoplasm of bone marrow: Secondary | ICD-10-CM

## 2011-03-21 DIAGNOSIS — M810 Age-related osteoporosis without current pathological fracture: Secondary | ICD-10-CM

## 2011-03-21 DIAGNOSIS — Z5112 Encounter for antineoplastic immunotherapy: Secondary | ICD-10-CM

## 2011-03-21 LAB — CBC WITH DIFFERENTIAL/PLATELET
Basophils Absolute: 0 10*3/uL (ref 0.0–0.1)
Eosinophils Absolute: 0.2 10*3/uL (ref 0.0–0.5)
HCT: 40.8 % (ref 34.8–46.6)
LYMPH%: 25.4 % (ref 14.0–49.7)
MCV: 91.1 fL (ref 79.5–101.0)
MONO#: 0.5 10*3/uL (ref 0.1–0.9)
MONO%: 11.7 % (ref 0.0–14.0)
NEUT#: 2.3 10*3/uL (ref 1.5–6.5)
NEUT%: 56.9 % (ref 38.4–76.8)
Platelets: 146 10*3/uL (ref 145–400)
RBC: 4.48 10*6/uL (ref 3.70–5.45)
nRBC: 0 % (ref 0–0)

## 2011-03-21 LAB — COMPREHENSIVE METABOLIC PANEL
Albumin: 4.1 g/dL (ref 3.5–5.2)
BUN: 16 mg/dL (ref 6–23)
CO2: 28 mEq/L (ref 19–32)
Calcium: 10.8 mg/dL — ABNORMAL HIGH (ref 8.4–10.5)
Chloride: 105 mEq/L (ref 96–112)
Creatinine, Ser: 0.88 mg/dL (ref 0.50–1.10)
Glucose, Bld: 88 mg/dL (ref 70–99)

## 2011-03-21 LAB — CANCER ANTIGEN 27.29: CA 27.29: 21 U/mL (ref 0–39)

## 2011-03-21 MED ORDER — TRASTUZUMAB CHEMO INJECTION 440 MG
6.0000 mg/kg | Freq: Once | INTRAVENOUS | Status: AC
  Administered 2011-03-21: 294 mg via INTRAVENOUS
  Filled 2011-03-21: qty 14

## 2011-03-21 MED ORDER — ACETAMINOPHEN 325 MG PO TABS
650.0000 mg | ORAL_TABLET | Freq: Once | ORAL | Status: AC
  Administered 2011-03-21: 650 mg via ORAL

## 2011-03-21 MED ORDER — SODIUM CHLORIDE 0.9 % IV SOLN
Freq: Once | INTRAVENOUS | Status: AC
  Administered 2011-03-21: 10:00:00 via INTRAVENOUS

## 2011-03-21 MED ORDER — ZOLEDRONIC ACID 4 MG/5ML IV CONC
3.0000 mg | Freq: Once | INTRAVENOUS | Status: AC
  Administered 2011-03-21: 3 mg via INTRAVENOUS
  Filled 2011-03-21: qty 3.75

## 2011-03-21 NOTE — Telephone Encounter (Signed)
gave patient appointment for 04-11-2011 starting at 10:45am

## 2011-03-21 NOTE — Progress Notes (Signed)
ID: [name]   Interval History: [name] returns today with her daughter-in-law for followup of [name] breast cancer. The Endo history is unremarkable. She had an episode of itching. She saw Dr. Terri [name] regarding this, and biopsy was consistent with a an arthropod bite. She has had some a red pain is well. A Dr. Lyman [name] obtained a bone scan in October, which showed a new tenths left rib lesion. However plain films of that area showed no abnormalities suggestive of an occult fracture. Overall the interval history is stable and she is planning to go to [facility] for the holidays.  ROS:  She is doing a lot of walking, doesn't sleep as well as she would like, and describes herself as mildly fatigued. She does have some hearing loss which is not new, little bit of sinus issues, and of course her arthritis. Overall however a review of systems today shows no new symptoms and nothing to suggest disease progression.   Medications: I have reviewed the patient's current medications.  Current Outpatient Prescriptions  Medication Sig Dispense Refill  . acetaminophen (TYLENOL) 500 MG tablet Take 500 mg by mouth at bedtime.        . DiphenhydrAMINE HCl (BENADRYL ALLERGY PO) Take by mouth.        . fish oil-omega-3 fatty acids 1000 MG capsule Take 2 g by mouth daily.        . metoprolol (TOPROL-XL) 50 MG 24 hr tablet Take 1 tablet (50 mg total) by mouth daily.  90 tablet  3  . Multiple Vitamin (MULTIVITAMIN PO) Take by mouth.        . trastuzumab (HERCEPTIN) 440 MG injection Inject into the vein once.           Objective: Vital signs in last 24 hours: BP 177/81  Pulse 48  Temp(Src) 96.2 F (35.7 C) (Oral)  Ht 5' 1.5" (1.562 m)  Wt 111 lb 4.8 oz (50.485 kg)  BMI 20.69 kg/m2   Physical Exam:    Sclerae unicteric  Oropharynx clear  No peripheral adenopathy  Lungs clear -- no rales or rhonchi  Heart regular rate and rhythm  Abdomen benign  MSK no focal spinal  tenderness, significant scoliosis  Neuro nonfocal  Breast exam: s/p L mastectomy; no active local recurrence; R breast unremarkable  Lab Results:  CMP  No results found for this basename: NA,K,CL,CO2,BUN,CREATININE,CALCIUM,LABALBU,PROT,BILITOT,ALKPHOS,ALT,AST,GLUCOSE in the last 168 hours   CBC Lab Results  Component Value Date   WBC 4.0 03/21/2011   HGB 13.5 03/21/2011   HCT 40.8 03/21/2011   MCV 91.1 03/21/2011   PLT 146 03/21/2011    Studies/Results: In addition to the bone scan and plain rib films noted above, she had mammography September 2012 at [facility]. Her most recent echocardiogram, August 2012, showed an excellent ejection fraction   Assessment:   77 year old [place] woman with  (1) status post left lumpectomy and sentinel lymph node dissection June of 2004 for a T2 N1 triple negative invasive ductal carcinoma, stage II, treated with radiation therapy completed October of 2007.  (2) local recurrence initially around the areola, status post left mastectomy October of 2007.  (3) A series of further skin recurrences first noted in November of 2008, serially resected at [facility], later a with multiple skin deposits as well as a large left axillary lymph node which was biopsied January 2011 and showed HER-2 positive invasive ductal carcinoma.  (4) on anti-HER-2 therapy since January of 2011, initially trastuzumab and lapatinib, with a  lapatinib discontinued April of 2011 due to side effects.   Plan:  The plan is to continue the Herceptin every 3 weeks, and the zoledronic acid every 3 months. She will receive both medications today. She will have an echocardiogram sometime within the next month, and then again sometime in February or March. She will see [place] again 3 months from now. At this point I am delighted that she is doing so well. She knows to call for any problems that may develop before the next visit  [name] C 03/21/2011

## 2011-03-21 NOTE — Patient Instructions (Signed)
1135-Pt discharged ambulatory with next appointment confirmed.  Pt aware to call with any questions or concerns.

## 2011-03-23 ENCOUNTER — Other Ambulatory Visit: Payer: Self-pay | Admitting: Oncology

## 2011-04-03 ENCOUNTER — Ambulatory Visit (HOSPITAL_BASED_OUTPATIENT_CLINIC_OR_DEPARTMENT_OTHER)
Admission: RE | Admit: 2011-04-03 | Discharge: 2011-04-03 | Disposition: A | Discharge status: Home / Self Care | Payer: Medicare Other | Source: Ambulatory Visit | Attending: Internal Medicine | Admitting: Internal Medicine

## 2011-04-03 ENCOUNTER — Ambulatory Visit (HOSPITAL_COMMUNITY)
Admission: RE | Admit: 2011-04-03 | Discharge: 2011-04-03 | Disposition: A | Discharge status: Home / Self Care | Payer: Medicare Other | Source: Ambulatory Visit | Attending: Internal Medicine | Admitting: Internal Medicine

## 2011-04-03 VITALS — HR 60 | Wt 110.8 lb

## 2011-04-03 DIAGNOSIS — I080 Rheumatic disorders of both mitral and aortic valves: Secondary | ICD-10-CM | POA: Insufficient documentation

## 2011-04-03 DIAGNOSIS — I2510 Atherosclerotic heart disease of native coronary artery without angina pectoris: Secondary | ICD-10-CM | POA: Insufficient documentation

## 2011-04-03 DIAGNOSIS — C50919 Malignant neoplasm of unspecified site of unspecified female breast: Secondary | ICD-10-CM | POA: Insufficient documentation

## 2011-04-03 DIAGNOSIS — I359 Nonrheumatic aortic valve disorder, unspecified: Secondary | ICD-10-CM

## 2011-04-03 DIAGNOSIS — D649 Anemia, unspecified: Secondary | ICD-10-CM | POA: Insufficient documentation

## 2011-04-03 NOTE — Progress Notes (Signed)
*  PRELIMINARY RESULTS* Echocardiogram 2D Echocardiogram has been performed.  Glean Salen [facility] 04/03/2011, 1:56 PM

## 2011-04-03 NOTE — Patient Instructions (Signed)
Your physician has requested that you have an echocardiogram. Echocardiography is a painless test that uses sound waves to create images of your heart. It provides your doctor with information about the size and shape of your heart and how well your heart's chambers and valves are working. This procedure takes approximately one hour. There are no restrictions for this procedure.  NEEDS IN 3 MONTHS  Your physician recommends that you schedule a follow-up appointment in: 3 months.

## 2011-04-03 NOTE — Assessment & Plan Note (Signed)
We reviewed role of cardio-onc clinic with her and her daughter-in-law. We reviewed echos at length. Lateral s' windows hard to see well but appears quite stable and no evidence of cardiotoxicty. Would continue Herceptin therapy. Will see back in 3 months with repeat echo.   Total time spent = 40 mins with over 50% of that time dedicated to reviewing echos.

## 2011-04-03 NOTE — Progress Notes (Signed)
  Subjective:    Patient ID: [name], female    DOB: [birth year], 77 y.o.   MRN: [id number]  HPI  [name] is a delightful 77 y/o woman referred by Dr. Darnelle Catalan for cardiac f/u in setting of Herceptin therapy.  Had cath in 2000 with no significant CAD. Found to have breast CA in 2004. Initial lumpectomy in 2004. There was evidence of recurrence. And then underwent mastectomy in 2006 at [facility]. Then treated with XRT. Had recurrence in L axillary node in 2010. Apparently the initial tumor was negative for ER/PR/Her 2-neu receptors. However lymph nod HER 2-neu + though to be mutation.   Started on Tykerb and Herceptin. Treated with Tykerb x 6 months. Getting Herceptin q 3weeks x 2 years. No CP, orthopnea, PND, edema.   Echos reviewed at length 5/12: EF 55-60% lat s' hard to see about 8cm/s   Echo today EF 55-60% lateral s' velocity initially read at 8cm/s but after searching for best window we were able to get 10.5 cm/s  Past Medical History  Diagnosis Date  . Cancer     breast  . Cataract   . CAD (coronary artery disease)     cath w/ trivial disease  . Diverticulosis of colon   . Osteoporosis     s/p compression fx T12   Past Surgical History  Procedure Date  . Abdominal hysterectomy   . Breast lumpectomy   . Excision for paget's vulvar   . Vertbroplasty     T12   No family history on file. History   Social History  . Marital Status: Widowed    Spouse Name: N/A    Number of Children: N/A  . Years of Education: N/A   Occupational History  . Not on file.   Social History Main Topics  . Smoking status: Former Smoker    Quit date: 05/01/1990  . Smokeless tobacco: Never Used  . Alcohol Use: No  . Drug Use: No  . Sexually Active: No   Other Topics Concern  . Not on file   Social History Narrative   HSG, no college. Married '44- '97 widowed. 2 dtrs - '46, '54, 1 son - '49; 6 grandchildren; 5 great-grands. 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 She lives alone. End of life care: Do not resuscitate; willing to have intensive care excluding mechanical ventilation.        Review of Systems System review is negative for any constitutional, cardiac, pulmonary, GI or neuro symptoms or complaints other than as described in the HPI.     Objective:   Physical Exam Elderly but ambulates without difficulyy General:  Well appearing. No resp difficulty HEENT: normal Neck: supple. no JVD. Carotids 2+ bilat; no bruits. No lymphadenopathy or thryomegaly appreciated. Cor: PMI nondisplaced. Regular rate & rhythm. Soft diastolic murmur at RSB Lungs: clear Abdomen: soft, nontender, nondistended. No hepatosplenomegaly. No bruits or masses. Good bowel sounds. Extremities: no cyanosis, clubbing, rash, edema Neuro: alert & orientedx3, cranial nerves grossly intact. moves all 4 extremities w/o difficulty. Affect pleasant        Assessment & Plan:

## 2011-04-11 ENCOUNTER — Other Ambulatory Visit: Payer: Self-pay | Admitting: Oncology

## 2011-04-11 ENCOUNTER — Ambulatory Visit (HOSPITAL_BASED_OUTPATIENT_CLINIC_OR_DEPARTMENT_OTHER): Payer: Medicare Other | Admitting: Lab

## 2011-04-11 ENCOUNTER — Encounter (HOSPITAL_BASED_OUTPATIENT_CLINIC_OR_DEPARTMENT_OTHER): Payer: Medicare Other | Admitting: Physician Assistant

## 2011-04-11 ENCOUNTER — Telehealth: Payer: Self-pay | Admitting: *Deleted

## 2011-04-11 ENCOUNTER — Ambulatory Visit (HOSPITAL_BASED_OUTPATIENT_CLINIC_OR_DEPARTMENT_OTHER): Payer: Medicare Other

## 2011-04-11 VITALS — BP 219/91 | HR 47 | Temp 97.3°F | Ht 61.5 in | Wt 111.0 lb

## 2011-04-11 DIAGNOSIS — H3530 Unspecified macular degeneration: Secondary | ICD-10-CM

## 2011-04-11 DIAGNOSIS — C50519 Malignant neoplasm of lower-outer quadrant of unspecified female breast: Secondary | ICD-10-CM

## 2011-04-11 DIAGNOSIS — Z5112 Encounter for antineoplastic immunotherapy: Secondary | ICD-10-CM

## 2011-04-11 DIAGNOSIS — C50919 Malignant neoplasm of unspecified site of unspecified female breast: Secondary | ICD-10-CM

## 2011-04-11 DIAGNOSIS — C7952 Secondary malignant neoplasm of bone marrow: Secondary | ICD-10-CM

## 2011-04-11 DIAGNOSIS — C7951 Secondary malignant neoplasm of bone: Secondary | ICD-10-CM

## 2011-04-11 LAB — CBC WITH DIFFERENTIAL/PLATELET
BASO%: 0.5 % (ref 0.0–2.0)
EOS%: 3.6 % (ref 0.0–7.0)
LYMPH%: 19 % (ref 14.0–49.7)
MCH: 31.1 pg (ref 25.1–34.0)
MCHC: 33.6 g/dL (ref 31.5–36.0)
MONO#: 0.5 10*3/uL (ref 0.1–0.9)
RBC: 4.88 10*6/uL (ref 3.70–5.45)
WBC: 4.9 10*3/uL (ref 3.9–10.3)
lymph#: 0.9 10*3/uL (ref 0.9–3.3)

## 2011-04-11 LAB — COMPREHENSIVE METABOLIC PANEL
ALT: 18 U/L (ref 0–35)
AST: 32 U/L (ref 0–37)
CO2: 28 mEq/L (ref 19–32)
Chloride: 103 mEq/L (ref 96–112)
Creatinine, Ser: 1 mg/dL (ref 0.50–1.10)
Sodium: 140 mEq/L (ref 135–145)
Total Bilirubin: 0.7 mg/dL (ref 0.3–1.2)
Total Protein: 8 g/dL (ref 6.0–8.3)

## 2011-04-11 MED ORDER — ACETAMINOPHEN 325 MG PO TABS
650.0000 mg | ORAL_TABLET | Freq: Once | ORAL | Status: DC
  Administered 2011-04-11: 325 mg via ORAL

## 2011-04-11 MED ORDER — DIPHENHYDRAMINE HCL 25 MG PO CAPS
25.0000 mg | ORAL_CAPSULE | Freq: Once | ORAL | Status: AC
  Administered 2011-04-11: 25 mg via ORAL

## 2011-04-11 MED ORDER — ACETAMINOPHEN 325 MG PO TABS: 650.0000 mg | ORAL_TABLET | Freq: Once | ORAL | Status: DC

## 2011-04-11 MED ORDER — SODIUM CHLORIDE 0.9 % IV SOLN
Freq: Once | INTRAVENOUS | Status: AC
  Administered 2011-04-11: 13:00:00 via INTRAVENOUS

## 2011-04-11 MED ORDER — TRASTUZUMAB CHEMO INJECTION 440 MG
6.0000 mg/kg | Freq: Once | INTRAVENOUS | Status: AC
  Administered 2011-04-11: 294 mg via INTRAVENOUS
  Filled 2011-04-11: qty 14

## 2011-04-11 MED ORDER — DIPHENHYDRAMINE HCL 25 MG PO CAPS: 25.0000 mg | ORAL_CAPSULE | Freq: Four times a day (QID) | ORAL | Status: DC | PRN

## 2011-04-11 MED ORDER — ZOLEDRONIC ACID 4 MG/5ML IV CONC: 3.0000 mg | Freq: Once | INTRAVENOUS | Status: DC

## 2011-04-11 NOTE — Telephone Encounter (Signed)
Made referral to Dr. Maris Berger for 2nd opinion for macular degeneration.  Appt. Is for 05/02/11 at 9:30am.  dolie will mail new pt. Info to her.  Let pt. Know of appt. While she is infusion today.

## 2011-04-11 NOTE — Patient Instructions (Signed)
Patient aware of next appointment; discharged home with friend. 

## 2011-04-15 NOTE — Progress Notes (Signed)
ID: [name]   Interval History: Ms. Guse returns today for followup of her breast cancer. The visit was difficult for her. There was some confusion in the lab, and a lot of delays in getting her treated. Partly this is due to our new computer system and partly to simple inefficiency. We will try to do better, and I also encouraged her whenever there is a hold up or delay to ask whenever she is speaking with to contact my nurse directly. Hopefully we will be able to facilitate her getting back to be evaluated and treated.  ROS: A detailed review of systems however was otherwise stable. The only new problem is macular degeneration. She saw Dr. Ninetta Lights regarding this. She felt very dissatisfied during that visit, which she describes as to brief, with no explanations, and "no hope". She would be interested in getting a second opinion and this is discussed below. Otherwise there have been no unusual pain, bleeding, rash, fever, or other symptoms suggestive of disease progression.  Medications: I have reviewed the patient's current medications.  Current Outpatient Prescriptions  Medication Sig Dispense Refill  . acetaminophen (TYLENOL) 500 MG tablet Take 500 mg by mouth at bedtime.        . Ascorbic Acid (VITAMIN C) 100 MG tablet Take 100 mg by mouth daily.        . DiphenhydrAMINE HCl (BENADRYL ALLERGY PO) Take by mouth as needed.       . fish oil-omega-3 fatty acids 1000 MG capsule Take 2 g by mouth daily.        . metoprolol (TOPROL-XL) 50 MG 24 hr tablet Take 1 tablet (50 mg total) by mouth daily.  90 tablet  3  . Multiple Vitamin (MULTIVITAMIN PO) Take by mouth.        . trastuzumab (HERCEPTIN) 440 MG injection Inject into the vein once.        . zolendronic acid (ZOMETA) 4 MG/5ML injection Inject 4 mg into the vein every 3 (three) months.           Objective:  Which was 111 pounds, BMI 20.7, temperature 97.3, respiratory rate 20,  Physical Exam:    Sclerae unicteric  Oropharynx  clear  No peripheral adenopathy  Lungs clear -- no rales or rhonchi  Heart regular rate and rhythm  Abdomen benign  MSK no focal spinal tenderness, no peripheral edema  Neuro nonfocal  Breast exam: Right breast no suspicious findings; status post left mastectomy with history of local recurrence, currently no evidence of disease activity in the left chest  Lab Results:   CMP    Chemistry      Component Value Date/Time   NA 140 04/11/2011 1235   K 4.2 04/11/2011 1235   CL 103 04/11/2011 1235   CO2 28 04/11/2011 1235   BUN 13 04/11/2011 1235   CREATININE 1.00 04/11/2011 1235      Component Value Date/Time   CALCIUM 10.9* 04/11/2011 1235   ALKPHOS 55 04/11/2011 1235   AST 32 04/11/2011 1235   ALT 18 04/11/2011 1235   BILITOT 0.7 04/11/2011 1235       CBC Lab Results  Component Value Date   WBC 4.9 04/11/2011   HGB 15.2 04/11/2011   HCT 45.2 04/11/2011   MCV 92.8 04/11/2011   PLT 179 04/11/2011   NEUTROABS 3.3 04/11/2011    Studies/Results: Transthoracic Echocardiography  Patient: Melenda, Bielak MR #: [id number] Study Date: 04/03/2011 Gender: F Age: 77 Height: Weight: BSA: Pt.  Status: Room:  PERFORMING [facility] REFERRING Norins, Carney Harder SONOGRAPHER Nolon Rod, RDCS ATTENDING Bensimhon, Gray Bernhardt Clegg, Amy REFERRING Clegg, Amy cc:  ------------------------------------------------------------ LV EF: 50% - 55%  ------------------------------------------------------------ Indications: Neoplasm - breast 174.9.  ------------------------------------------------------------ History: PMH: Follow up monitoring for Chemotherapy. Coronary artery disease. Risk factors: Anemia.  ------------------------------------------------------------ Study Conclusions  - Left ventricle: The cavity size was normal. Systolic function was normal. The estimated ejection fraction was in the range of 50% to 55%. Wall motion was normal; there were  no regional wall motion abnormalities. Left ventricular diastolic function parameters were normal. - Aortic valve: Mild regurgitation. - Mitral valve: Mild regurgitation. - Atrial septum: No defect or patent foramen ovale was identified. - Pulmonary arteries: PA peak pressure: 33mm Hg (S). Transthoracic echocardiography. M-mode, complete 2D, spectral Doppler, and color Doppler. Blood pressure: 105/62. Patient status: Outpatient. Location: Echo laboratory.  Right mammogram at [facility] Sept 2012 was negative    Assessment:  77 year old [place] woman status post left lumpectomy and sentinel lymph node dissection June of 2004 for a T2 N1 (Stage II) invasive ductal carcinoma, triple negative, status post radiation therapy completed October of 2004, with local recurrence around the areola October 2007, leading to mastectomy; then a series of further skin recurrences first noted November of 2008, initially C. really resected, later with too many lesions to resect, as well as a large left axillary lymph node which was biopsied January of 2011 showing the tumor now to be HER-2 positive. Since January 2011 she has been on anti-HER-2 treatment, initially with trastuzumab plus lapatinib, with a lapatinib discontinued April of 2011 because of side effects.   Plan: There is no evidence of further recurrence which is very gratifying. We will continue the trastuzumab every 3 weeks and the zoledronic acid every 3 months. I am referring her to Dr. Maris Berger  for a second opinion regarding her macular degeneration issue. She will return to see me with her next zoledronic acid dose March 2013.  MAGRINAT,GUSTAV C 04/15/2011

## 2011-05-03 ENCOUNTER — Telehealth: Payer: Self-pay | Admitting: Oncology

## 2011-05-03 ENCOUNTER — Other Ambulatory Visit (HOSPITAL_BASED_OUTPATIENT_CLINIC_OR_DEPARTMENT_OTHER): Payer: Medicare Other | Admitting: Lab

## 2011-05-03 ENCOUNTER — Other Ambulatory Visit: Payer: Self-pay | Admitting: Oncology

## 2011-05-03 ENCOUNTER — Ambulatory Visit (HOSPITAL_BASED_OUTPATIENT_CLINIC_OR_DEPARTMENT_OTHER): Payer: Medicare Other

## 2011-05-03 VITALS — BP 167/82 | HR 52 | Temp 97.0°F

## 2011-05-03 DIAGNOSIS — C7951 Secondary malignant neoplasm of bone: Secondary | ICD-10-CM

## 2011-05-03 DIAGNOSIS — C50919 Malignant neoplasm of unspecified site of unspecified female breast: Secondary | ICD-10-CM

## 2011-05-03 DIAGNOSIS — Z5112 Encounter for antineoplastic immunotherapy: Secondary | ICD-10-CM

## 2011-05-03 DIAGNOSIS — C50519 Malignant neoplasm of lower-outer quadrant of unspecified female breast: Secondary | ICD-10-CM

## 2011-05-03 LAB — CBC WITH DIFFERENTIAL/PLATELET
BASO%: 2 % (ref 0.0–2.0)
EOS%: 5.3 % (ref 0.0–7.0)
HCT: 44.2 % (ref 34.8–46.6)
LYMPH%: 21.7 % (ref 14.0–49.7)
MCH: 29.8 pg (ref 25.1–34.0)
MCHC: 32.6 g/dL (ref 31.5–36.0)
MONO#: 0.6 10*3/uL (ref 0.1–0.9)
MONO%: 13.5 % (ref 0.0–14.0)
NEUT%: 57.5 % (ref 38.4–76.8)
Platelets: 175 10*3/uL (ref 145–400)
RBC: 4.83 10*6/uL (ref 3.70–5.45)
WBC: 4.5 10*3/uL (ref 3.9–10.3)
nRBC: 0 % (ref 0–0)

## 2011-05-03 LAB — COMPREHENSIVE METABOLIC PANEL
ALT: 16 U/L (ref 0–35)
AST: 29 U/L (ref 0–37)
Alkaline Phosphatase: 50 U/L (ref 39–117)
BUN: 19 mg/dL (ref 6–23)
Calcium: 10.2 mg/dL (ref 8.4–10.5)
Creatinine, Ser: 1.14 mg/dL — ABNORMAL HIGH (ref 0.50–1.10)
Total Bilirubin: 0.7 mg/dL (ref 0.3–1.2)

## 2011-05-03 MED ORDER — DIPHENHYDRAMINE HCL 25 MG PO CAPS
25.0000 mg | ORAL_CAPSULE | Freq: Once | ORAL | Status: AC
  Administered 2011-05-03: 25 mg via ORAL

## 2011-05-03 MED ORDER — TRASTUZUMAB CHEMO INJECTION 440 MG
6.0000 mg/kg | Freq: Once | INTRAVENOUS | Status: AC
  Administered 2011-05-03: 294 mg via INTRAVENOUS
  Filled 2011-05-03: qty 14

## 2011-05-03 MED ORDER — SODIUM CHLORIDE 0.9 % IV SOLN
Freq: Once | INTRAVENOUS | Status: AC
  Administered 2011-05-03: 09:00:00 via INTRAVENOUS

## 2011-05-03 MED ORDER — ACETAMINOPHEN 325 MG PO TABS
650.0000 mg | ORAL_TABLET | Freq: Once | ORAL | Status: AC
  Administered 2011-05-03: 650 mg via ORAL

## 2011-05-03 NOTE — Telephone Encounter (Signed)
Gv pt appts for jan-march2013

## 2011-05-23 ENCOUNTER — Other Ambulatory Visit: Payer: Self-pay | Admitting: Oncology

## 2011-05-23 ENCOUNTER — Other Ambulatory Visit (HOSPITAL_BASED_OUTPATIENT_CLINIC_OR_DEPARTMENT_OTHER): Payer: Medicare Other | Admitting: Lab

## 2011-05-23 ENCOUNTER — Ambulatory Visit (HOSPITAL_BASED_OUTPATIENT_CLINIC_OR_DEPARTMENT_OTHER): Payer: Medicare Other

## 2011-05-23 VITALS — BP 161/76 | HR 52 | Temp 96.9°F

## 2011-05-23 DIAGNOSIS — Z5112 Encounter for antineoplastic immunotherapy: Secondary | ICD-10-CM

## 2011-05-23 DIAGNOSIS — C7952 Secondary malignant neoplasm of bone marrow: Secondary | ICD-10-CM

## 2011-05-23 DIAGNOSIS — C50519 Malignant neoplasm of lower-outer quadrant of unspecified female breast: Secondary | ICD-10-CM

## 2011-05-23 DIAGNOSIS — C50919 Malignant neoplasm of unspecified site of unspecified female breast: Secondary | ICD-10-CM

## 2011-05-23 DIAGNOSIS — D649 Anemia, unspecified: Secondary | ICD-10-CM

## 2011-05-23 LAB — BASIC METABOLIC PANEL
BUN: 12 mg/dL (ref 6–23)
CO2: 23 mEq/L (ref 19–32)
Calcium: 9.9 mg/dL (ref 8.4–10.5)
Glucose, Bld: 105 mg/dL — ABNORMAL HIGH (ref 70–99)
Potassium: 4.3 mEq/L (ref 3.5–5.3)
Sodium: 141 mEq/L (ref 135–145)

## 2011-05-23 LAB — CBC WITH DIFFERENTIAL/PLATELET
Basophils Absolute: 0 10*3/uL (ref 0.0–0.1)
EOS%: 5.3 % (ref 0.0–7.0)
Eosinophils Absolute: 0.2 10*3/uL (ref 0.0–0.5)
HCT: 44.7 % (ref 34.8–46.6)
HGB: 14.5 g/dL (ref 11.6–15.9)
MCH: 29.9 pg (ref 25.1–34.0)
MCV: 92.2 fL (ref 79.5–101.0)
MONO%: 15.5 % — ABNORMAL HIGH (ref 0.0–14.0)
NEUT#: 2.1 10*3/uL (ref 1.5–6.5)
NEUT%: 51.8 % (ref 38.4–76.8)
RDW: 13.7 % (ref 11.2–14.5)

## 2011-05-23 MED ORDER — SODIUM CHLORIDE 0.9 % IV SOLN
Freq: Once | INTRAVENOUS | Status: AC
  Administered 2011-05-23: 11:00:00 via INTRAVENOUS

## 2011-05-23 MED ORDER — ACETAMINOPHEN 325 MG PO TABS
650.0000 mg | ORAL_TABLET | Freq: Once | ORAL | Status: AC
  Administered 2011-05-23: 650 mg via ORAL

## 2011-05-23 MED ORDER — TRASTUZUMAB CHEMO INJECTION 440 MG
6.0000 mg/kg | Freq: Once | INTRAVENOUS | Status: AC
  Administered 2011-05-23: 294 mg via INTRAVENOUS
  Filled 2011-05-23: qty 14

## 2011-05-23 NOTE — Patient Instructions (Signed)
1315 Pt ambulatory upon discharge with daughter at side and verbalized understanding of next appt date.

## 2011-06-13 ENCOUNTER — Encounter: Payer: Self-pay | Admitting: Physician Assistant

## 2011-06-13 ENCOUNTER — Telehealth: Payer: Self-pay | Admitting: Oncology

## 2011-06-13 ENCOUNTER — Ambulatory Visit (HOSPITAL_BASED_OUTPATIENT_CLINIC_OR_DEPARTMENT_OTHER): Payer: Medicare Other

## 2011-06-13 ENCOUNTER — Ambulatory Visit (HOSPITAL_BASED_OUTPATIENT_CLINIC_OR_DEPARTMENT_OTHER): Payer: Medicare Other | Admitting: Physician Assistant

## 2011-06-13 ENCOUNTER — Other Ambulatory Visit (HOSPITAL_BASED_OUTPATIENT_CLINIC_OR_DEPARTMENT_OTHER): Payer: Medicare Other | Admitting: Lab

## 2011-06-13 VITALS — BP 183/81 | HR 47 | Temp 97.7°F | Ht 61.5 in | Wt 110.5 lb

## 2011-06-13 DIAGNOSIS — Z5112 Encounter for antineoplastic immunotherapy: Secondary | ICD-10-CM

## 2011-06-13 DIAGNOSIS — C50919 Malignant neoplasm of unspecified site of unspecified female breast: Secondary | ICD-10-CM

## 2011-06-13 DIAGNOSIS — C7952 Secondary malignant neoplasm of bone marrow: Secondary | ICD-10-CM

## 2011-06-13 DIAGNOSIS — C50519 Malignant neoplasm of lower-outer quadrant of unspecified female breast: Secondary | ICD-10-CM

## 2011-06-13 LAB — CBC WITH DIFFERENTIAL/PLATELET
BASO%: 1 % (ref 0.0–2.0)
EOS%: 4.9 % (ref 0.0–7.0)
HCT: 43 % (ref 34.8–46.6)
LYMPH%: 23.2 % (ref 14.0–49.7)
MCH: 30.1 pg (ref 25.1–34.0)
MCHC: 33 g/dL (ref 31.5–36.0)
NEUT%: 59.3 % (ref 38.4–76.8)
Platelets: 131 10*3/uL — ABNORMAL LOW (ref 145–400)
RBC: 4.72 10*6/uL (ref 3.70–5.45)

## 2011-06-13 LAB — COMPREHENSIVE METABOLIC PANEL
ALT: 13 U/L (ref 0–35)
AST: 33 U/L (ref 0–37)
Alkaline Phosphatase: 54 U/L (ref 39–117)
Creatinine, Ser: 1.09 mg/dL (ref 0.50–1.10)
Sodium: 143 mEq/L (ref 135–145)
Total Bilirubin: 0.6 mg/dL (ref 0.3–1.2)

## 2011-06-13 MED ORDER — TRASTUZUMAB CHEMO INJECTION 440 MG
6.0000 mg/kg | Freq: Once | INTRAVENOUS | Status: AC
  Administered 2011-06-13: 294 mg via INTRAVENOUS
  Filled 2011-06-13: qty 14

## 2011-06-13 MED ORDER — ACETAMINOPHEN 325 MG PO TABS
650.0000 mg | ORAL_TABLET | Freq: Once | ORAL | Status: AC
  Administered 2011-06-13: 650 mg via ORAL

## 2011-06-13 MED ORDER — SODIUM CHLORIDE 0.9 % IV SOLN
Freq: Once | INTRAVENOUS | Status: AC
  Administered 2011-06-13: 13:00:00 via INTRAVENOUS

## 2011-06-13 NOTE — Telephone Encounter (Signed)
gve the pt her march 2013 appt calendar 

## 2011-06-13 NOTE — Progress Notes (Signed)
Hematology and Oncology Follow Up Visit  [name] [id number] 16-Dec-1913 78 y.o. 06/13/2011    HPI: [name]'s gynecologist, Dr. Conley Simmonds, palpated a mass in the patient's left breast in 2004.  The patient has a daughter, who is a Engineer, civil (consulting), formerly I believe an oncology nurse, currently a pulmonary nurse in [place], so the patient went there for her care, and while I do not have all the workup, I do have the pathology report from October 01, 2002, which was her left lumpectomy and sentinel lymph node sampling under Dr. Rexene Edison.  This showed [phone]) a 2.5 cm infiltrating ductal carcinoma with lobular features (the cells were E-cadherin positive), involving one of three sentinel lymph nodes.  The anterior margin was close at less than 1 mm.  The patient saw Dr. Chipper Herb here, and the option of completion mastectomy versus radiotherapy alone was discussed.  He felt, and I would probably have agreed, that despite the very close margin, which was nevertheless technically negative, proceeding to radiation without "clearing the margin" further would be adequate.  Accordingly, the patient did receive a total of 5040 cGy with an additional 1000 cGy boost to the left breast between September 11 and February 24, 2003.    The patient then did well, but in 2007 developed some redness and bumpiness around the left areola.  She brought this to Dr. Wilfred Lacy attention, and apparently an initial biopsy was negative, as was a mammogram, however, with further development of the change, the patient had a biopsy under anesthesia, and this apparently was positive (I do not have those records).  With this information, she proceeded to left mastectomy on February 26, 2006.  The pathology there ([id number]) showed dermal involvement by adenocarcinoma, with pagetoid spread and lymphatic space invasion.  The closest margin being 1.2 cm from the inferior margin.  Everything else being amply negative. There were  no discrete masses or lesions identified in the left breast.  The patient subsequently underwent left mastectomy.  There were a series of further skin recurrences, first in November of 2008. Initially, these were resected, but later there were too many lesions to resect, in addition to a large left axillary lymph node. This was biopsied in January 2011, showing the tumor now to be HER-2/neu positive. Patient has been on anti-HER-2/neu treatments since January 2011, initially with trastuzumab plus lapatinib. Lapatinib was discontinued in April 2011 secondary to side effects. Continues now to receive trastuzumab on a q. 3 week basis. Also receives a zoledronic acid every 3 months.    Interim History:   [name] returns today accompanied by her daughter-in-law [name] for followup of her left breast carcinoma. She is due for her next dose of trastuzumab. She also receives a zoledronic acid every 3 months, but this was last given 2 months ago in December, and we'll not be due again until March.  [name]'s primary concern today is her ongoing issue with macular degeneration. She is recently evaluated by Dr. Maris Berger and understands that there is "nothing they can do". She is "trying to think positive" but is very concerned about losing the ability to drive and losing the ability to read.  [name] has diffuse bony pain and joint pain, neither of which are new. Some days are worse than others. Today she tells me she feels "out of sorts". Her only pain medication is Tylenol which thus far is effective.  She has had no chest pain, shortness of breath, or orthopnea. No  peripheral swelling. [name] continues to be followed by Dr. Gala Romney, her most recent echocardiogram on 04/03/2011 showing an EF of 50-55%. No jaw pain or dental procedures.  A detailed review of systems is otherwise noncontributory as noted below.  Review of Systems: Constitutional: fatigue, no weight loss, fever, night  sweats Eyes: decreased vision [id number] Cardiovascular: no chest pain or dyspnea on exertion Respiratory: no cough, shortness of breath, or wheezing Neurological: no TIA or stroke symptoms negative Dermatological: negative Gastrointestinal: no abdominal pain, nausea, change in bowel habits, or black or bloody stools Genito-Urinary: no dysuria, trouble voiding, or hematuria Hematological and Lymphatic: negative Breast: negative Musculoskeletal: positive for - joint pain Remaining ROS negative.  FAMILY HISTORY:  The patient's father died in an automobile accident at 96, the patient's mother from a stroke at 20.  The patient had one brother who died from causes unknown to her at the age of 34, and a sister who had "lots of problems" and died at the age of 14, but there is no history of breast or ovarian cancer in the family.   GYNECOLOGIC HISTORY:  She is GX P3.  She took hormone replacement therapy briefly after menopause.    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  She has been a widow since 42..  Lives by herself, drives, and does all of her activities of daily living.  Her daughter, [name], is a Engineer, civil (consulting) in [place].  Her daughter, [name], lives in [place], and she is here today with [name], her daughter-in-law, married to the patient's son, who is also her power of attorney.  He works for the [employer].  The patient has six grandchildren and three great-grandchildren.  She attends [employer].   Medications:   I have reviewed the patient's current medications.  Current Outpatient Prescriptions  Medication Sig Dispense Refill  . acetaminophen (TYLENOL) 500 MG tablet Take 500 mg by mouth at bedtime.        . Ascorbic Acid (VITAMIN C) 100 MG tablet Take 100 mg by mouth daily.        . DiphenhydrAMINE HCl (BENADRYL ALLERGY PO) Take by mouth as needed.       . fish oil-omega-3 fatty  acids 1000 MG capsule Take 2 g by mouth daily.        . metoprolol (TOPROL-XL) 50 MG 24 hr tablet Take 1 tablet (50 mg total) by mouth daily.  90 tablet  3  . Multiple Vitamin (MULTIVITAMIN PO) Take by mouth.        . trastuzumab (HERCEPTIN) 440 MG injection Inject into the vein once.        . zolendronic acid (ZOMETA) 4 MG/5ML injection Inject 4 mg into the vein every 3 (three) months.          Allergies:  Allergies  Allergen Reactions  . Solifenacin Succinate     REACTION: "sick" nausea    Physical Exam: Filed Vitals:   06/13/11 1056  BP: 183/81  Pulse: 47  Temp: 97.7 F (36.5 C)   HEENT:  Sclerae anicteric, conjunctivae pink.  Oropharynx clear.  No mucositis or candidiasis.   Nodes:  No cervical, supraclavicular, or axillary lymphadenopathy palpated.  Breast Exam:  Right breast is benign, no masses, discharge, or nipple inversion. No skin changes. Patient is status post left mastectomy. No suspicious nodularities or skin changes. No evidence of recurrence.   Lungs:  Clear to auscultation bilaterally.  No crackles, rhonchi, or wheezes.   Heart:  Regular rate and rhythm.   Abdomen:  Soft, thin, nontender.  Positive bowel sounds.  No organomegaly or masses palpated.   Musculoskeletal:  Kyphosis and scoliosis notable on exam. No focal spinal tenderness to gentle palpation.  Extremities:  Benign.  No peripheral edema or cyanosis.   Skin:  Benign.   Neuro:  Nonfocal.   Lab Results: Lab Results  Component Value Date   WBC 4.1 06/13/2011   HGB 14.2 06/13/2011   HCT 43.0 06/13/2011   MCV 91.1 06/13/2011   PLT 131* 06/13/2011   NEUTROABS 2.4 06/13/2011     Chemistry      Component Value Date/Time   NA 141 05/23/2011 1028   K 4.3 05/23/2011 1028   CL 107 05/23/2011 1028   CO2 23 05/23/2011 1028   BUN 12 05/23/2011 1028   CREATININE 0.93 05/23/2011 1028      Component Value Date/Time   CALCIUM 9.9 05/23/2011 1028   ALKPHOS 50 05/03/2011 0834   AST 29 05/03/2011 0834   ALT 16 05/03/2011  0834   BILITOT 0.7 05/03/2011 0834        Radiological Studies:  04/03/2011 Transthoracic Echocardiography  Patient: [name], [name] MR #: [id number] Study Date: 04/03/2011 Gender: F Age: 30 Height: Weight: BSA: Pt. Status: Room:  PERFORMING Maryjean Ka REFERRING Norins, Carney Harder SONOGRAPHER Nolon Rod, RDCS ATTENDING Bensimhon, Gray Bernhardt Clegg, Shelvia Fojtik REFERRING Clegg, Slade Pierpoint cc:  ------------------------------------------------------------ LV EF: 50% - 55%  ------------------------------------------------------------ Indications: Neoplasm - breast 174.9.  ------------------------------------------------------------ History: PMH: Follow up monitoring for Chemotherapy. Coronary artery disease. Risk factors: Anemia.  ------------------------------------------------------------ Study Conclusions  - Left ventricle: The cavity size was normal. Systolic function was normal. The estimated ejection fraction was in the range of 50% to 55%. Wall motion was normal; there were no regional wall motion abnormalities. Left ventricular diastolic function parameters were normal. - Aortic valve: Mild regurgitation. - Mitral valve: Mild regurgitation. - Atrial septum: No defect or patent foramen ovale was identified. - Pulmonary arteries: PA peak pressure: 33mm Hg (S). Transthoracic echocardiography. M-mode, complete 2D, spectral Doppler, and color Doppler. Blood pressure: 105/62. Patient status: Outpatient. Location: Echo laboratory.   Assessment:  78 year old [place] woman   (1)  status post left lumpectomy and sentinel lymph node dissection June of 2004 for a T2 N1 (Stage II) invasive ductal carcinoma, triple negative, status post radiation therapy completed October of 2004  (2)   local recurrence around the areola October 2007, leading to mastectomy;   (3)  then a series of further skin recurrences first noted November of 2008.  Initially these were  resected, later with too many lesions to resect, as well as a large left axillary lymph node which was biopsied January of 2011 showing the tumor now to be HER-2 positive.   (4)  Since January 2011 she has been on anti-HER-2 treatment, initially with trastuzumab plus lapatinib, with a lapatinib discontinued April of 2011 because of side effects.     (5)  continues to receive trastuzumab every 3 weeks. Also receive zoledronic acid every 3 months.  Plan:  Mrs. Braaten will proceed to treatment today as scheduled for her next three-week dose of trastuzumab. She scheduled for repeat echocardiogram and followup appointment with Dr. Gala Romney on March 4. She will have her next dose of trastuzumab on March 6, and will return to see [place] for followup with her next dose of trastuzumab along with zoledronic acid  on March 27.    This plan was reviewed with the patient, who voices understanding and agreement.  She knows to call with any changes or problems.    Lavene Penagos, PA-C 06/13/2011

## 2011-06-14 ENCOUNTER — Ambulatory Visit (INDEPENDENT_AMBULATORY_CARE_PROVIDER_SITE_OTHER)
Admission: RE | Admit: 2011-06-14 | Discharge: 2011-06-14 | Disposition: A | Discharge status: Home / Self Care | Payer: Medicare Other | Source: Ambulatory Visit | Attending: Internal Medicine | Admitting: Internal Medicine

## 2011-06-14 ENCOUNTER — Ambulatory Visit (INDEPENDENT_AMBULATORY_CARE_PROVIDER_SITE_OTHER): Payer: Medicare Other | Admitting: Internal Medicine

## 2011-06-14 ENCOUNTER — Encounter: Payer: Self-pay | Admitting: Internal Medicine

## 2011-06-14 VITALS — BP 102/88 | HR 52 | Temp 97.4°F | Resp 14 | Wt 110.5 lb

## 2011-06-14 DIAGNOSIS — R079 Chest pain, unspecified: Secondary | ICD-10-CM

## 2011-06-14 DIAGNOSIS — R0781 Pleurodynia: Secondary | ICD-10-CM

## 2011-06-14 MED ORDER — LIDOCAINE 5 % EX PTCH: 1.0000 | MEDICATED_PATCH | CUTANEOUS | Status: AC

## 2011-06-14 NOTE — Patient Instructions (Signed)
Pain in right chest after fall - no fracture to my eye on x-ray. Very likely you have torn muscles.  Plan - apply lidoderm patch to affected area - on 12 hrs , off 12 hrs           Use "heat patch" during the time you do not wear the lidoderm patch           OK to take tylenol 500 mg or 1000 mg on schedule three times a day.           You can use your pAin medications if needed.           Try to do some movement every day to prevent becoming to "stiff."

## 2011-06-17 NOTE — Progress Notes (Signed)
  Subjective:    Patient ID: [name], female    DOB: 12/05/1913, 78 y.o.   MRN: [id number]  HPI Mrs. Monfort lost her footing and fell several hours ago and presents acutely due to severe right sided chest pain. She did not loose consciousness, did not strike her head. She is sore but has full active ROM right UE/shoulder. She reports that it hurts when she takes a deep breath. She has no acute SOB, no hemoptysis.  I have reviewed the patient's medical history in detail and updated the computerized patient record.    Review of Systems System review is negative for any constitutional, cardiac, pulmonary, GI or neuro symptoms or complaints other than as described in the HPI.     Objective:   Physical Exam Filed Vitals:   06/14/11 1559  BP: 102/88  Pulse: 52  Temp: 97.4 F (36.3 C)  Resp: 14   Gen'l - elderly white woman alert and oriented but in pain. Pulm - normal respirations although deep inspiration is painful. Cor - RRR  CXR w/ rib details - reviewed films with patient and report: no rib fracture, no sign of pneumothorax.       Assessment & Plan:  Chest wall pain - no fracture. She landed with hand/arm extended and may have sustained marked strain and damage to chest wall muscles. Wrist and arm are normal.  Plan - lidoderm patch to area of greatest tenderness right chest wall.          APAP up to 1,000 mg tid.

## 2011-06-19 ENCOUNTER — Other Ambulatory Visit: Payer: Self-pay | Admitting: Certified Registered Nurse Anesthetist

## 2011-06-21 ENCOUNTER — Ambulatory Visit: Payer: Medicare Other | Admitting: Oncology

## 2011-06-21 ENCOUNTER — Other Ambulatory Visit: Payer: Medicare Other | Admitting: Lab

## 2011-07-02 ENCOUNTER — Ambulatory Visit (HOSPITAL_COMMUNITY): Payer: Medicare Other

## 2011-07-02 ENCOUNTER — Encounter: Payer: Self-pay | Admitting: Internal Medicine

## 2011-07-02 ENCOUNTER — Ambulatory Visit (INDEPENDENT_AMBULATORY_CARE_PROVIDER_SITE_OTHER): Payer: Medicare Other | Admitting: Internal Medicine

## 2011-07-02 ENCOUNTER — Telehealth: Payer: Self-pay | Admitting: Internal Medicine

## 2011-07-02 DIAGNOSIS — R071 Chest pain on breathing: Secondary | ICD-10-CM

## 2011-07-02 DIAGNOSIS — R0789 Other chest pain: Secondary | ICD-10-CM

## 2011-07-02 MED ORDER — METOPROLOL SUCCINATE ER 50 MG PO TB24: 50.0000 mg | ORAL_TABLET | Freq: Every day | ORAL | Status: DC

## 2011-07-02 NOTE — Patient Instructions (Signed)
Continued pain in the right chest - the thought of bone mets die arise but you had a negative bone scan in October '12. Plan - tylenol is SAFE at the dose of 1000 mg three times a day. Please resume           Lidoderm patch may also be restarted.           If the pain does continue it may be prudent to consider repeating the bone scan.  Stomach discomfort - NOT from tylenol. Plan - please try over the counter Zantac or Pepcid to treat stomach discomfort.

## 2011-07-02 NOTE — Telephone Encounter (Signed)
Tried calling the patient back several times, the line was busy.  Will keep trying

## 2011-07-02 NOTE — Telephone Encounter (Signed)
The pt called and stated she is having rib pain.  She is hoping to be worked in today.  Can this be done?   Thanks!   Pt callback - [phone]

## 2011-07-02 NOTE — Telephone Encounter (Signed)
May add on at end of schedule today - let her know 1) call at a little before 5 to know how far behind I am 2) she may have a wait.

## 2011-07-03 DIAGNOSIS — R0789 Other chest pain: Secondary | ICD-10-CM | POA: Insufficient documentation

## 2011-07-03 NOTE — Assessment & Plan Note (Signed)
Persistent chest wall pain with no evidence of fracture. No pulmonary symptoms. She did have a negative bone scan in Oct '12.  Plan - resume use of lidoderm patch           Resume APAP 1000 mg tid           For persistent pain will need to consider  Repeat bone scan.

## 2011-07-03 NOTE — Progress Notes (Signed)
  Subjective:    Patient ID: [name], female    DOB: 1913/08/12, 78 y.o.   MRN: [id number]  HPI [name] presents for reevaluation of pain in the right chest wall. She was recently seen: chest x-ray with rib details negative for pulmonary disease or rib fracture. She was Rx'd for lidoderm patch and APAPA 1000 mg tid. This did give her some relief but she stopped taking APAP for fear of toxicity and stopped lidoderm patch due possible skin irritation. Since stopping treatment her pain is worse. Reviewed chart: last bone scan in Oct '12 was negative for evidence of bone mets.  She denies any injury, strain, cough or other inciting event.   PMH, FamHx and SocHx reviewed for any changes and relevance.    Review of Systems System review is negative for any constitutional, cardiac, pulmonary, GI or neuro symptoms or complaints other than as described in the HPI.     Objective:   Physical Exam Filed Vitals:   07/02/11 1645  BP: 128/82  Pulse: 63  Temp: 96.9 F (36.1 C)  Resp: 14   Wt Readings from Last 3 Encounters:  07/02/11 107 lb 8 oz (48.762 kg)  06/14/11 110 lb 8 oz (50.122 kg)  06/13/11 110 lb 8 oz (50.122 kg)   Gen'l- very thin older white woman in no acute distress. Very bright and alert. Pulm- no increased WOB, no rales or wheeze. Chest wall - tender on the right posterior axillary line Cor - RRR Neuor - very bright and alert.       Assessment & Plan:

## 2011-07-04 ENCOUNTER — Other Ambulatory Visit (HOSPITAL_BASED_OUTPATIENT_CLINIC_OR_DEPARTMENT_OTHER): Payer: Medicare Other | Admitting: Lab

## 2011-07-04 ENCOUNTER — Ambulatory Visit (HOSPITAL_BASED_OUTPATIENT_CLINIC_OR_DEPARTMENT_OTHER): Payer: Medicare Other

## 2011-07-04 VITALS — BP 176/74 | HR 48 | Temp 95.8°F

## 2011-07-04 DIAGNOSIS — C50919 Malignant neoplasm of unspecified site of unspecified female breast: Secondary | ICD-10-CM

## 2011-07-04 DIAGNOSIS — Z5112 Encounter for antineoplastic immunotherapy: Secondary | ICD-10-CM

## 2011-07-04 LAB — CBC WITH DIFFERENTIAL/PLATELET
BASO%: 0.7 % (ref 0.0–2.0)
Basophils Absolute: 0 10*3/uL (ref 0.0–0.1)
EOS%: 3.8 % (ref 0.0–7.0)
HCT: 40.1 % (ref 34.8–46.6)
HGB: 13.3 g/dL (ref 11.6–15.9)
MCH: 30 pg (ref 25.1–34.0)
MONO#: 0.5 10*3/uL (ref 0.1–0.9)
NEUT#: 2.5 10*3/uL (ref 1.5–6.5)
NEUT%: 59.2 % (ref 38.4–76.8)
RDW: 13.7 % (ref 11.2–14.5)
WBC: 4.2 10*3/uL (ref 3.9–10.3)
lymph#: 1 10*3/uL (ref 0.9–3.3)

## 2011-07-04 LAB — BASIC METABOLIC PANEL WITH GFR
BUN: 14 mg/dL (ref 6–23)
CO2: 26 meq/L (ref 19–32)
Calcium: 10 mg/dL (ref 8.4–10.5)
Chloride: 107 meq/L (ref 96–112)
Creatinine, Ser: 0.88 mg/dL (ref 0.50–1.10)
Glucose, Bld: 80 mg/dL (ref 70–99)
Potassium: 4.3 meq/L (ref 3.5–5.3)
Sodium: 141 meq/L (ref 135–145)

## 2011-07-04 MED ORDER — SODIUM CHLORIDE 0.9 % IV SOLN
6.0000 mg/kg | Freq: Once | INTRAVENOUS | Status: AC
  Administered 2011-07-04: 294 mg via INTRAVENOUS
  Filled 2011-07-04: qty 14

## 2011-07-04 MED ORDER — DIPHENHYDRAMINE HCL 25 MG PO CAPS: 25.0000 mg | ORAL_CAPSULE | Freq: Once | ORAL | Status: DC

## 2011-07-04 MED ORDER — ACETAMINOPHEN 325 MG PO TABS
650.0000 mg | ORAL_TABLET | Freq: Once | ORAL | Status: AC
  Administered 2011-07-04: 325 mg via ORAL

## 2011-07-04 MED ORDER — SODIUM CHLORIDE 0.9 % IV SOLN: Freq: Once | INTRAVENOUS | Status: DC

## 2011-07-07 ENCOUNTER — Ambulatory Visit (INDEPENDENT_AMBULATORY_CARE_PROVIDER_SITE_OTHER): Payer: Medicare Other | Admitting: Family Medicine

## 2011-07-07 ENCOUNTER — Other Ambulatory Visit: Payer: Self-pay | Admitting: Family Medicine

## 2011-07-07 ENCOUNTER — Encounter: Payer: Self-pay | Admitting: Family Medicine

## 2011-07-07 VITALS — BP 110/80 | Wt 103.0 lb

## 2011-07-07 DIAGNOSIS — R300 Dysuria: Secondary | ICD-10-CM

## 2011-07-07 LAB — POCT URINALYSIS DIPSTICK
Bilirubin, UA: NEGATIVE
Ketones, UA: NEGATIVE
Spec Grav, UA: <=1.005

## 2011-07-07 MED ORDER — CEPHALEXIN 500 MG PO CAPS: ORAL_CAPSULE | ORAL | Status: DC

## 2011-07-07 NOTE — Patient Instructions (Signed)
It was wonderful to meet you. You do have a urinary tract infection. Please continue to drink plenty of fluids and take antibiotic as directed- 1 tablet twice daily for 7 days. If you develop a fever, nausea, vomiting or any new or worsening symptoms, please let us know.

## 2011-07-07 NOTE — Progress Notes (Signed)
  Subjective:    Patient ID: [name], female    DOB: 1913-09-01, 78 y.o.   MRN: [id number]  HPI  78 yo pt of Dr. Debby [name] here for ?vaginal or urinary bleeding.  Medical records reviewed in Epic. Followed by Dr. Darnelle [name] for breast cancer with h/o multiple recurrences.    Initially, these were resected, but later there were too many lesions to resect, in addition to a large left axillary lymph node. This was biopsied in January 2011, showing the tumor now to be HER-2/neu positive. Patient has been on anti-HER-2/neu treatments since January 2011, initially with trastuzumab plus lapatinib. Lapatinib was discontinued in April 2011 secondary to side effects. Continues now to receive trastuzumab on a q. 3 week basis. Also receives a zoledronic acid every 3 months.   [name] developed acute onset of urinary frequency, urgency, hematuria x 1  day, without flank pain, fever or chills.       Review of Systems See HPI    Objective:   Physical Exam BP 110/80  Wt 103 lb (46.72 kg)  OBJECTIVE: Appears well, in no apparent distress.  Vital signs are normal. The abdomen is soft without tenderness, guarding, mass, rebound or organomegaly. No CVA tenderness or inguinal adenopathy noted. Urine dipstick shows positive for RBC's and positive for leukocytes.  Micro exam: not done.        Assessment & Plan:  ASSESSMENT: UTI uncomplicated without evidence of pyelonephritis  PLAN: Treatment per orders - keflex 500 mg twice daily x 7 days, also push fluids, may use Pyridium OTC prn. Call or return to clinic prn if these symptoms worsen or fail to improve as anticipated.

## 2011-07-09 ENCOUNTER — Telehealth: Payer: Self-pay

## 2011-07-09 LAB — URINE CULTURE

## 2011-07-09 NOTE — Telephone Encounter (Signed)
[facility] Triage Call Report Triage Record Num: [id number] Operator: April Finney Patient Name: [name] Call Date & Time: 07/07/2011 7:54:53AM Patient Phone: [phone] PCP: Illene Regulus Patient Gender: Female PCP Fax : [phone] Patient DOB: Aug 26, 1913 Practice Name: [facility] Reason for Call: Caller: Verdis/Patient; PCP: Illene Regulus; CB#: [phone]; Call Reason: Pelvic pain and bleeding. She is not sure if it is vaginal or urinary. Each time she goes to urinate it turns the toilet water red. Afebrile. No flank pain. She did have a fall 3 weeks ago. No emergent symptoms. See in 4 hrs care advice given. Protocol(s) Used: Bloody Urine Protocol(s) Used: Urinary Symptoms - Female Recommended Outcome per Protocol: See Provider within 4 hours Reason for Outcome: Blood in urine Passing blood clots with urination Care Advice: ~ Another adult should drive. ~ Call provider immediately if urination is painful or decreased amount of urine output. ~ SYMPTOM / CONDITION MANAGEMENT ~ CAUTIONS ~ List, or take, all current prescription(s), nonprescription or alternative medication(s) to provider for evaluation. Most adults need to drink 6-10 eight-ounce glasses (1.2-2.0 liters) of fluids per day unless previously told to limit fluid intake for other medical reasons. Limit fluids that contain caffeine, sugar or alcohol. Urine will be a very light yellow color when you drink enough fluids. ~ 07/07/2011 8:12:26AM Page 1 of 1 [email or web address]

## 2011-07-25 ENCOUNTER — Other Ambulatory Visit: Payer: Medicare Other | Admitting: Lab

## 2011-07-25 ENCOUNTER — Encounter: Payer: Self-pay | Admitting: Physician Assistant

## 2011-07-25 ENCOUNTER — Other Ambulatory Visit: Payer: Self-pay | Admitting: Oncology

## 2011-07-25 ENCOUNTER — Encounter: Payer: Self-pay | Admitting: *Deleted

## 2011-07-25 ENCOUNTER — Ambulatory Visit (HOSPITAL_BASED_OUTPATIENT_CLINIC_OR_DEPARTMENT_OTHER): Payer: Medicare Other | Admitting: Physician Assistant

## 2011-07-25 ENCOUNTER — Ambulatory Visit: Payer: Medicare Other | Admitting: Lab

## 2011-07-25 ENCOUNTER — Ambulatory Visit (HOSPITAL_BASED_OUTPATIENT_CLINIC_OR_DEPARTMENT_OTHER): Payer: Medicare Other

## 2011-07-25 ENCOUNTER — Telehealth: Payer: Self-pay | Admitting: Oncology

## 2011-07-25 VITALS — BP 181/83 | HR 48 | Temp 97.6°F | Ht 60.5 in | Wt 108.7 lb

## 2011-07-25 DIAGNOSIS — C50919 Malignant neoplasm of unspecified site of unspecified female breast: Secondary | ICD-10-CM

## 2011-07-25 DIAGNOSIS — R300 Dysuria: Secondary | ICD-10-CM

## 2011-07-25 DIAGNOSIS — M810 Age-related osteoporosis without current pathological fracture: Secondary | ICD-10-CM

## 2011-07-25 DIAGNOSIS — Z5112 Encounter for antineoplastic immunotherapy: Secondary | ICD-10-CM

## 2011-07-25 LAB — COMPREHENSIVE METABOLIC PANEL
ALT: 21 U/L (ref 0–35)
CO2: 30 mEq/L (ref 19–32)
Calcium: 10.5 mg/dL (ref 8.4–10.5)
Chloride: 104 mEq/L (ref 96–112)
Creatinine, Ser: 0.89 mg/dL (ref 0.50–1.10)
Total Protein: 7.6 g/dL (ref 6.0–8.3)

## 2011-07-25 LAB — URINALYSIS, MICROSCOPIC - CHCC
Bilirubin (Urine): NEGATIVE
Blood: NEGATIVE
Glucose: NEGATIVE g/dL
Ketones: NEGATIVE mg/dL
Specific Gravity, Urine: 1.01 (ref 1.003–1.035)
pH: 7 (ref 4.6–8.0)

## 2011-07-25 LAB — CANCER ANTIGEN 27.29: CA 27.29: 23 U/mL (ref 0–39)

## 2011-07-25 LAB — CBC WITH DIFFERENTIAL/PLATELET
Eosinophils Absolute: 0.2 10*3/uL (ref 0.0–0.5)
HCT: 40.7 % (ref 34.8–46.6)
HGB: 13.3 g/dL (ref 11.6–15.9)
LYMPH%: 22 % (ref 14.0–49.7)
MONO#: 0.6 10*3/uL (ref 0.1–0.9)
NEUT#: 3.2 10*3/uL (ref 1.5–6.5)
Platelets: 155 10*3/uL (ref 145–400)
RBC: 4.46 10*6/uL (ref 3.70–5.45)
WBC: 5.2 10*3/uL (ref 3.9–10.3)

## 2011-07-25 MED ORDER — DIPHENHYDRAMINE HCL 25 MG PO CAPS: 25.0000 mg | ORAL_CAPSULE | Freq: Once | ORAL | Status: DC

## 2011-07-25 MED ORDER — ACETAMINOPHEN 325 MG PO TABS
650.0000 mg | ORAL_TABLET | Freq: Once | ORAL | Status: AC
  Administered 2011-07-25: 650 mg via ORAL

## 2011-07-25 MED ORDER — SODIUM CHLORIDE 0.9 % IV SOLN
Freq: Once | INTRAVENOUS | Status: AC
  Administered 2011-07-25: 13:00:00 via INTRAVENOUS

## 2011-07-25 MED ORDER — ZOLEDRONIC ACID 4 MG/5ML IV CONC
3.0000 mg | Freq: Once | INTRAVENOUS | Status: AC
  Administered 2011-07-25: 3 mg via INTRAVENOUS
  Filled 2011-07-25: qty 3.75

## 2011-07-25 MED ORDER — TRASTUZUMAB CHEMO INJECTION 440 MG
6.0000 mg/kg | Freq: Once | INTRAVENOUS | Status: AC
  Administered 2011-07-25: 294 mg via INTRAVENOUS
  Filled 2011-07-25: qty 14

## 2011-07-25 NOTE — Progress Notes (Signed)
ID: [name]   DOB: 13-Mar-1914  MR#: [id number]  YNW#:[id number]  HISTORY OF PRESENT ILLNESS: Mrs. Schauer's gynecologist, Dr. Conley Simmonds, palpated a mass in the patient's left breast in 2004. The patient has a daughter, who is a Engineer, civil (consulting), formerly I believe an oncology nurse, currently a pulmonary nurse in [place], so the patient went there for her care, and while I do not have all the workup, I do have the pathology report from October 01, 2002, which was her left lumpectomy and sentinel lymph node sampling under Dr. Rexene Edison. This showed [phone]) a 2.5 cm infiltrating ductal carcinoma with lobular features (the cells were E-cadherin positive), involving one of three sentinel lymph nodes. The anterior margin was close at less than 1 mm. The patient saw Dr. Chipper Herb here, and the option of completion mastectomy versus radiotherapy alone was discussed. He felt, and I would probably have agreed, that despite the very close margin, which was nevertheless technically negative, proceeding to radiation without "clearing the margin" further would be adequate. Accordingly, the patient did receive a total of 5040 cGy with an additional 1000 cGy boost to the left breast between September 11 and February 24, 2003.  The patient then did well, but in 2007 developed some redness and bumpiness around the left areola. She brought this to Dr. Wilfred Lacy attention, and apparently an initial biopsy was negative, as was a mammogram, however, with further development of the change, the patient had a biopsy under anesthesia, and this apparently was positive (I do not have those records). With this information, she proceeded to left mastectomy on February 26, 2006. 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 The closest margin being 1.2 cm from the inferior margin. Everything else being amply negative. There were no discrete masses or  lesions identified in the left breast. The patient subsequently underwent left mastectomy.  There were a series of further skin recurrences, first in November of 2008. Initially, these were resected, but later there were too many lesions to resect, in addition to a large left axillary lymph node. This was biopsied in January 2011, showing the tumor now to be HER-2/neu positive. Patient has been on anti-HER-2/neu treatments since January 2011, initially with trastuzumab plus lapatinib. Lapatinib was discontinued in April 2011 secondary to side effects. Continues now to receive trastuzumab on a q. 3 week basis. Also receives a zoledronic acid every 3 months.    INTERVAL HISTORY: Mrs. Ramos returns today accompanied by her daughter-in-law [name] for followup of her left breast carcinoma.  She continues to receive trastuzumab every 3 weeks, with zoledronic acid given every 3 months. She is due for both agents today.  Interim history is remarkable for her a fall at home about 5 weeks ago. She bent over to pick something up,  Started up, turned around, and lost her balance. She did have several bruises. She is still a little sore, but fortunately she had no fractures. She had no head injury and did not lose consciousness. This was evaluated by her primary care physician, Dr. Alvera Novel.  Interim history is also remarkable for having been treated recently for urinary tract infection after noticing blood in the urine. She still has some mild residual dysuria, but denies any additional hematuria. She's had no fevers, chills, or night sweats. No pelvic discomfort or pain.  REVIEW OF SYSTEMS: Otherwise, Mrs. Younce continues to have chronic pain in the left chest wall although this has not changed  since her last visit here. She is actually feeling quite well, and tells me her energy level is "like normal". She was able to drive a short distance yesterday for a dental appointment. She's had no chest pain or shortness of  breath. No nausea, and no change in bowel habits. No signs of abnormal bleeding. No jaw pain or recent dental procedures.  A detailed review of systems is otherwise noncontributory.  PAST MEDICAL HISTORY: Past Medical History  Diagnosis Date  . Cancer     breast  . Cataract   . CAD (coronary artery disease)     cath w/ trivial disease  . Diverticulosis of colon   . Osteoporosis     s/p compression fx T12    PAST SURGICAL HISTORY: Past Surgical History  Procedure Date  . Abdominal hysterectomy   . Breast lumpectomy   . Excision for paget's vulvar   . Vertbroplasty     T12    FAMILY HISTORY No family history on file. The patient's father died in an automobile accident at 11, the patient's mother from a stroke at 55. The patient had one brother who died from causes unknown to her at the age of 54, and a sister who had "lots of problems" and died at the age of 62, but there is no history of breast or ovarian cancer in the family.   GYNECOLOGIC HISTORY: She is GX P3. She took hormone replacement therapy briefly after menopause.   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 She has been a widow since 71.. Lives by herself, drives, and does all of her activities of daily living. Her daughter, [name], is a Engineer, civil (consulting) in [place]. Her daughter, [name], lives in [place], and she is here today with Kiri Hinderliter, her daughter-in-law, married to the patient's son, who is also her power of attorney. He works for the [employer]. The patient has six grandchildren and three great-grandchildren. She attends [employer].        ADVANCED DIRECTIVES:  HEALTH MAINTENANCE: History  Substance Use Topics  . Smoking status: Former Smoker    Quit date: 05/01/1990  . Smokeless tobacco: Never Used  . Alcohol Use: No     Colonoscopy:  PAP:  Bone density:  Lipid panel:  Allergies  Allergen Reactions    . Solifenacin Succinate     REACTION: "sick" nausea    Current Outpatient Prescriptions  Medication Sig Dispense Refill  . acetaminophen (TYLENOL) 500 MG tablet Take 500 mg by mouth at bedtime.        . Ascorbic Acid (VITAMIN C) 100 MG tablet Take 100 mg by mouth daily.        . DiphenhydrAMINE HCl (BENADRYL ALLERGY PO) Take by mouth as needed.       . fish oil-omega-3 fatty acids 1000 MG capsule Take 2 g by mouth daily.        . metoprolol succinate (TOPROL-XL) 50 MG 24 hr tablet Take 1 tablet (50 mg total) by mouth daily.  90 tablet  3  . Multiple Vitamin (MULTIVITAMIN PO) Take by mouth.        . trastuzumab (HERCEPTIN) 440 MG injection Inject into the vein once.        . zolendronic acid (ZOMETA) 4 MG/5ML injection Inject 4 mg into the vein every 3 (three) months.          OBJECTIVE: Filed Vitals:  07/25/11 1154  BP: 181/83  Pulse: 48  Temp: 97.6 F (36.4 C)     Body mass index is 20.88 kg/(m^2).    ECOG FS: 1  Sclerae unicteric Oropharynx clear No peripheral adenopathy Lungs no rales or rhonchi, clear to auscultation bilaterally. Heart regular rate and rhythm Abd benign, soft, nontender, positive bowel sounds MSK no focal spinal tenderness, no peripheral edema Skin is benign Neuro: nonfocal Breasts: Right breast unremarkable, no masses, skin changes, or nipple inversion. Patient status post left mastectomy withis nodularities and no evidence of skin lesions. No evidence of local recurrence the left chest wall.  LAB RESULTS: Lab Results  Component Value Date   WBC 5.2 07/25/2011   NEUTROABS 3.2 07/25/2011   HGB 13.3 07/25/2011   HCT 40.7 07/25/2011   MCV 91.3 07/25/2011   PLT 155 07/25/2011      Chemistry      Component Value Date/Time   NA 141 07/04/2011 1055   K 4.3 07/04/2011 1055   CL 107 07/04/2011 1055   CO2 26 07/04/2011 1055   BUN 14 07/04/2011 1055   CREATININE 0.88 07/04/2011 1055      Component Value Date/Time   CALCIUM 10.0 07/04/2011 1055   ALKPHOS 54  06/13/2011 1036   AST 33 06/13/2011 1036   ALT 13 06/13/2011 1036   BILITOT 0.6 06/13/2011 1036       Lab Results  Component Value Date   LABCA2 21 03/21/2011     STUDIES: Patient scheduled for her next echocardiogram next week on April 1.  Most recent right mammogram was at [facility] in September 2012 and was unremarkable.  ASSESSMENT: 78 year old [place] woman  (1) status post left lumpectomy and sentinel lymph node dissection June of 2004 for a T2 N1 (Stage II) invasive ductal carcinoma, triple negative, status post radiation therapy completed October of 2004  (2) local recurrence around the areola October 2007, leading to mastectomy;  (3) then a series of further skin recurrences first noted November of 2008. Initially these were resected, later with too many lesions to resect, as well as a large left axillary lymph node which was biopsied January of 2011 showing the tumor now to be HER-2 positive.  (4) Since January 2011 she has been on anti-HER-2 treatment, initially with trastuzumab plus lapatinib, with a lapatinib discontinued April of 2011 because of side effects.  (5) continues to receive trastuzumab every 3 weeks and zoledronic acid every 3 months.     PLAN: Mrs. Lorenzen will proceed to treatment today for both trastuzumab and zoledronic acid. We'll continue to treat her with trastuzumab every 3 weeks, and she'll return in 3 months for routine followup in in time for her next zoledronic acid. As noted above, she is also scheduled for repeat echocardiogram next week, April 1.  Repeating urinalysis and urine culture today to make sure her recent urinary tract infection has resolved since she is still having some residual dysuria.  Patient voices understanding and agreement with this plan, and noticed a call with any changes or problems.  Jerson Furukawa    07/25/2011

## 2011-07-25 NOTE — Telephone Encounter (Signed)
[name] in the tx room to gve the pt her may,june 2013 appts

## 2011-07-25 NOTE — Progress Notes (Signed)
Encounter addended by: Noralee Space, RN on: 07/25/2011  2:47 PM<BR>     Documentation filed: Orders

## 2011-07-25 NOTE — Progress Notes (Unsigned)
Per AB, notified William Hamburger, RN in the  infusion room that pt's UTI has resolved

## 2011-07-26 LAB — URINE CULTURE

## 2011-07-30 ENCOUNTER — Ambulatory Visit (HOSPITAL_COMMUNITY)
Admission: RE | Admit: 2011-07-30 | Discharge: 2011-07-30 | Disposition: A | Discharge status: Home / Self Care | Payer: Medicare Other | Source: Ambulatory Visit | Attending: Internal Medicine | Admitting: Internal Medicine

## 2011-07-30 VITALS — BP 120/70 | HR 52 | Wt 109.0 lb

## 2011-07-30 DIAGNOSIS — I509 Heart failure, unspecified: Secondary | ICD-10-CM | POA: Insufficient documentation

## 2011-07-30 DIAGNOSIS — I10 Essential (primary) hypertension: Secondary | ICD-10-CM | POA: Insufficient documentation

## 2011-07-30 DIAGNOSIS — I359 Nonrheumatic aortic valve disorder, unspecified: Secondary | ICD-10-CM

## 2011-07-30 DIAGNOSIS — C50919 Malignant neoplasm of unspecified site of unspecified female breast: Secondary | ICD-10-CM

## 2011-07-30 DIAGNOSIS — Z09 Encounter for follow-up examination after completed treatment for conditions other than malignant neoplasm: Secondary | ICD-10-CM | POA: Insufficient documentation

## 2011-07-30 NOTE — Progress Notes (Signed)
  Echocardiogram 2D Echocardiogram has been performed.  [name] L 07/30/2011, 11:27 AM

## 2011-07-30 NOTE — Patient Instructions (Signed)
Echo looks good.  Follow up 3 months with echo.  Your physician has requested that you have an echocardiogram. Echocardiography is a painless test that uses sound waves to create images of your heart. It provides your doctor with information about the size and shape of your heart and how well your heart's chambers and valves are working. This procedure takes approximately one hour. There are no restrictions for this procedure.

## 2011-08-03 NOTE — Progress Notes (Signed)
HPI:  [name] is a delightful 78 y/o woman referred by Dr. Darnelle Catalan for cardiac f/u in setting of Herceptin therapy.  Had cath in 2000 with no significant CAD. Found to have breast CA in 2004. Initial lumpectomy in 2004. There was evidence of recurrence. And then underwent mastectomy in 2006 at [facility]. Then treated with XRT. Had recurrence in L axillary node in 2010. Apparently the initial tumor was negative for ER/PR/Her 2-neu receptors. However lymph nod HER 2-neu + though to be mutation.   Started on Tykerb and Herceptin. Treated with Tykerb x 6 months.  Continues on Herceptin q 3 weeks.    Here for routine follow up today.  Feels well.  Three weeks ago she fell due to quick turn causing pain on her Rt side, cxr showed no fracture.  She continues to have mild pain on Rt side worse with walking and deep inspiration.  She denies CP, orthopnea, PND, or edema.  Fatigue noted after each herceptin treatment.   Echos  5/12: EF 55-60% lat s' hard to see about 8cm/s  12/12: EF 55-60% lateral s' velocity 9.2 cm/s 4/13: EF 55-60% lateral s' 9.3-9.5 cm/s (difficult window)   Past Medical History  Diagnosis Date  . Cancer     breast  . Cataract   . CAD (coronary artery disease)     cath w/ trivial disease  . Diverticulosis of colon   . Osteoporosis     s/p compression fx T12   Current Outpatient Prescriptions on File Prior to Encounter  Medication Sig Dispense Refill  . acetaminophen (TYLENOL) 500 MG tablet Take 500 mg by mouth at bedtime.        . Ascorbic Acid (VITAMIN C) 100 MG tablet Take 100 mg by mouth daily.        . DiphenhydrAMINE HCl (BENADRYL ALLERGY PO) Take by mouth as needed.       . fish oil-omega-3 fatty acids 1000 MG capsule Take 2 g by mouth daily.        . metoprolol succinate (TOPROL-XL) 50 MG 24 hr tablet Take 1 tablet (50 mg total) by mouth daily.  90 tablet  3  . Multiple Vitamin (MULTIVITAMIN PO) Take by mouth.        . trastuzumab (HERCEPTIN) 440 MG injection Inject  into the vein once.        . zolendronic acid (ZOMETA) 4 MG/5ML injection Inject 4 mg into the vein every 3 (three) months.          Review of Systems System review is negative for any constitutional, cardiac, pulmonary, GI or neuro symptoms or complaints other than as described in the HPI.     Objective:    Filed Vitals:   07/30/11 1148  BP: 120/70  Pulse: 52  Weight: 109 lb (49.442 kg)  SpO2: 98%   Physical Exam: Elderly but ambulates without difficulyy General:  Well appearing. No resp difficulty HEENT: normal Neck: supple. no JVD. Carotids 2+ bilat; no bruits. No lymphadenopathy or thryomegaly appreciated. Cor: PMI nondisplaced. Regular rate & rhythm. Soft diastolic murmur at RSB Lungs: clear Abdomen: soft, nontender, nondistended. No hepatosplenomegaly. No bruits or masses. Good bowel sounds. Extremities: no cyanosis, clubbing, rash, edema Neuro: alert & orientedx3, cranial nerves grossly intact. moves all 4 extremities w/o difficulty. Affect pleasant     Assessment & Plan:

## 2011-08-03 NOTE — Assessment & Plan Note (Signed)
Echo reviewed. EF and tissue Doppler parameters are stable. No clinical HF. Continue Herceptin.

## 2011-08-15 ENCOUNTER — Other Ambulatory Visit (HOSPITAL_BASED_OUTPATIENT_CLINIC_OR_DEPARTMENT_OTHER): Payer: Medicare Other

## 2011-08-15 ENCOUNTER — Ambulatory Visit (HOSPITAL_BASED_OUTPATIENT_CLINIC_OR_DEPARTMENT_OTHER): Payer: Medicare Other

## 2011-08-15 VITALS — BP 145/76 | HR 51 | Temp 97.6°F

## 2011-08-15 DIAGNOSIS — Z5112 Encounter for antineoplastic immunotherapy: Secondary | ICD-10-CM

## 2011-08-15 DIAGNOSIS — C50519 Malignant neoplasm of lower-outer quadrant of unspecified female breast: Secondary | ICD-10-CM

## 2011-08-15 DIAGNOSIS — C50919 Malignant neoplasm of unspecified site of unspecified female breast: Secondary | ICD-10-CM

## 2011-08-15 LAB — COMPREHENSIVE METABOLIC PANEL
ALT: 18 U/L (ref 0–35)
Alkaline Phosphatase: 61 U/L (ref 39–117)
CO2: 28 mEq/L (ref 19–32)
Creatinine, Ser: 0.98 mg/dL (ref 0.50–1.10)
Total Bilirubin: 0.3 mg/dL (ref 0.3–1.2)

## 2011-08-15 LAB — CBC WITH DIFFERENTIAL/PLATELET
EOS%: 4.8 % (ref 0.0–7.0)
Eosinophils Absolute: 0.2 10*3/uL (ref 0.0–0.5)
LYMPH%: 22.6 % (ref 14.0–49.7)
MCH: 29.8 pg (ref 25.1–34.0)
MCHC: 32.5 g/dL (ref 31.5–36.0)
MCV: 91.6 fL (ref 79.5–101.0)
MONO%: 12.9 % (ref 0.0–14.0)
NEUT#: 2.5 10*3/uL (ref 1.5–6.5)
Platelets: 154 10*3/uL (ref 145–400)
RBC: 4.53 10*6/uL (ref 3.70–5.45)
RDW: 14.5 % (ref 11.2–14.5)
nRBC: 0 % (ref 0–0)

## 2011-08-15 MED ORDER — TRASTUZUMAB CHEMO INJECTION 440 MG
6.0000 mg/kg | Freq: Once | INTRAVENOUS | Status: AC
  Administered 2011-08-15: 294 mg via INTRAVENOUS
  Filled 2011-08-15: qty 14

## 2011-08-15 MED ORDER — DIPHENHYDRAMINE HCL 25 MG PO CAPS: 25.0000 mg | ORAL_CAPSULE | Freq: Once | ORAL | Status: DC

## 2011-08-15 MED ORDER — SODIUM CHLORIDE 0.9 % IV SOLN
Freq: Once | INTRAVENOUS | Status: AC
  Administered 2011-08-15: 10:00:00 via INTRAVENOUS

## 2011-08-15 MED ORDER — ACETAMINOPHEN 325 MG PO TABS
650.0000 mg | ORAL_TABLET | Freq: Once | ORAL | Status: AC
  Administered 2011-08-15: 650 mg via ORAL

## 2011-08-20 ENCOUNTER — Other Ambulatory Visit: Payer: Self-pay | Admitting: Certified Registered Nurse Anesthetist

## 2011-09-05 ENCOUNTER — Other Ambulatory Visit (HOSPITAL_BASED_OUTPATIENT_CLINIC_OR_DEPARTMENT_OTHER): Payer: Medicare Other | Admitting: Lab

## 2011-09-05 ENCOUNTER — Ambulatory Visit (HOSPITAL_BASED_OUTPATIENT_CLINIC_OR_DEPARTMENT_OTHER): Payer: Medicare Other

## 2011-09-05 VITALS — BP 178/92 | HR 82 | Temp 98.1°F

## 2011-09-05 DIAGNOSIS — C50519 Malignant neoplasm of lower-outer quadrant of unspecified female breast: Secondary | ICD-10-CM

## 2011-09-05 DIAGNOSIS — C50919 Malignant neoplasm of unspecified site of unspecified female breast: Secondary | ICD-10-CM

## 2011-09-05 DIAGNOSIS — C7952 Secondary malignant neoplasm of bone marrow: Secondary | ICD-10-CM

## 2011-09-05 DIAGNOSIS — Z5112 Encounter for antineoplastic immunotherapy: Secondary | ICD-10-CM

## 2011-09-05 LAB — COMPREHENSIVE METABOLIC PANEL
ALT: 15 U/L (ref 0–35)
AST: 28 U/L (ref 0–37)
Albumin: 3.8 g/dL (ref 3.5–5.2)
Alkaline Phosphatase: 56 U/L (ref 39–117)
Glucose, Bld: 94 mg/dL (ref 70–99)
Potassium: 4.1 mEq/L (ref 3.5–5.3)
Sodium: 140 mEq/L (ref 135–145)
Total Bilirubin: 0.3 mg/dL (ref 0.3–1.2)
Total Protein: 7.1 g/dL (ref 6.0–8.3)

## 2011-09-05 LAB — CBC WITH DIFFERENTIAL/PLATELET
BASO%: 0.9 % (ref 0.0–2.0)
Basophils Absolute: 0 10*3/uL (ref 0.0–0.1)
EOS%: 5.4 % (ref 0.0–7.0)
HGB: 14 g/dL (ref 11.6–15.9)
MCH: 30.3 pg (ref 25.1–34.0)
MCV: 92.2 fL (ref 79.5–101.0)
MONO%: 11.4 % (ref 0.0–14.0)
RBC: 4.62 10*6/uL (ref 3.70–5.45)
RDW: 14.2 % (ref 11.2–14.5)
lymph#: 1.2 10*3/uL (ref 0.9–3.3)
nRBC: 0 % (ref 0–0)

## 2011-09-05 MED ORDER — SODIUM CHLORIDE 0.9 % IV SOLN
Freq: Once | INTRAVENOUS | Status: AC
  Administered 2011-09-05: 11:00:00 via INTRAVENOUS

## 2011-09-05 MED ORDER — TRASTUZUMAB CHEMO INJECTION 440 MG
6.0000 mg/kg | Freq: Once | INTRAVENOUS | Status: AC
  Administered 2011-09-05: 294 mg via INTRAVENOUS
  Filled 2011-09-05: qty 14

## 2011-09-05 MED ORDER — ACETAMINOPHEN 325 MG PO TABS
650.0000 mg | ORAL_TABLET | Freq: Once | ORAL | Status: AC
  Administered 2011-09-05: 650 mg via ORAL

## 2011-09-05 MED ORDER — DIPHENHYDRAMINE HCL 25 MG PO CAPS: 25.0000 mg | ORAL_CAPSULE | Freq: Once | ORAL | Status: DC

## 2011-09-05 NOTE — Patient Instructions (Signed)
[facility] Discharge Instructions for Patients Receiving Chemotherapy  Today you received the following chemotherapy agents Herceptin.  To help prevent nausea and vomiting after your treatment, we encourage you to take your nausea medication.   If you develop nausea and vomiting that is not controlled by your nausea medication, call the clinic. If it is after clinic hours your family physician or the after hours number for the clinic or go to the Emergency Department.   BELOW ARE SYMPTOMS THAT SHOULD BE REPORTED IMMEDIATELY:  *FEVER GREATER THAN 100.5 F  *CHILLS WITH OR WITHOUT FEVER  NAUSEA AND VOMITING THAT IS NOT CONTROLLED WITH YOUR NAUSEA MEDICATION  *UNUSUAL SHORTNESS OF BREATH  *UNUSUAL BRUISING OR BLEEDING  TENDERNESS IN MOUTH AND THROAT WITH OR WITHOUT PRESENCE OF ULCERS  *URINARY PROBLEMS  *BOWEL PROBLEMS  UNUSUAL RASH Items with * indicate a potential emergency and should be followed up as soon as possible.  One of the nurses will contact you 24 hours after your treatment. Please let the nurse know about any problems that you may have experienced. Feel free to call the clinic you have any questions or concerns. The clinic phone number is [phone].   I have been informed and understand all the instructions given to me. I know to contact the clinic, my physician, or go to the Emergency Department if any problems should occur. I do not have any questions at this time, but understand that I may call the clinic during office hours   should I have any questions or need assistance in obtaining follow up care.    __________________________________________  _____________  __________ Signature of Patient or Authorized Representative            Date                   Time    __________________________________________ Nurse's Signature    

## 2011-09-25 ENCOUNTER — Other Ambulatory Visit: Payer: Self-pay | Admitting: Oncology

## 2011-09-26 ENCOUNTER — Ambulatory Visit (HOSPITAL_BASED_OUTPATIENT_CLINIC_OR_DEPARTMENT_OTHER): Payer: Medicare Other

## 2011-09-26 ENCOUNTER — Other Ambulatory Visit: Payer: Medicare Other | Admitting: Lab

## 2011-09-26 VITALS — BP 188/81 | HR 47

## 2011-09-26 DIAGNOSIS — Z5112 Encounter for antineoplastic immunotherapy: Secondary | ICD-10-CM

## 2011-09-26 DIAGNOSIS — C50919 Malignant neoplasm of unspecified site of unspecified female breast: Secondary | ICD-10-CM

## 2011-09-26 LAB — CBC WITH DIFFERENTIAL/PLATELET
BASO%: 0.9 % (ref 0.0–2.0)
LYMPH%: 25.7 % (ref 14.0–49.7)
MCHC: 32.7 g/dL (ref 31.5–36.0)
MONO#: 0.5 10*3/uL (ref 0.1–0.9)
RBC: 4.41 10*6/uL (ref 3.70–5.45)
RDW: 13.9 % (ref 11.2–14.5)
WBC: 4.5 10*3/uL (ref 3.9–10.3)
lymph#: 1.2 10*3/uL (ref 0.9–3.3)
nRBC: 0 % (ref 0–0)

## 2011-09-26 LAB — COMPREHENSIVE METABOLIC PANEL
ALT: 14 U/L (ref 0–35)
AST: 26 U/L (ref 0–37)
Calcium: 9.3 mg/dL (ref 8.4–10.5)
Chloride: 106 mEq/L (ref 96–112)
Creatinine, Ser: 0.78 mg/dL (ref 0.50–1.10)
Sodium: 141 mEq/L (ref 135–145)

## 2011-09-26 MED ORDER — SODIUM CHLORIDE 0.9 % IV SOLN
Freq: Once | INTRAVENOUS | Status: AC
  Administered 2011-09-26: 13:00:00 via INTRAVENOUS

## 2011-09-26 MED ORDER — DIPHENHYDRAMINE HCL 25 MG PO CAPS: 25.0000 mg | ORAL_CAPSULE | Freq: Once | ORAL | Status: DC

## 2011-09-26 MED ORDER — TRASTUZUMAB CHEMO INJECTION 440 MG
6.0000 mg/kg | Freq: Once | INTRAVENOUS | Status: AC
  Administered 2011-09-26: 294 mg via INTRAVENOUS
  Filled 2011-09-26: qty 14

## 2011-09-26 MED ORDER — ACETAMINOPHEN 325 MG PO TABS
650.0000 mg | ORAL_TABLET | Freq: Once | ORAL | Status: AC
  Administered 2011-09-26: 650 mg via ORAL

## 2011-10-17 ENCOUNTER — Ambulatory Visit (HOSPITAL_BASED_OUTPATIENT_CLINIC_OR_DEPARTMENT_OTHER): Payer: Medicare Other | Admitting: Oncology

## 2011-10-17 ENCOUNTER — Encounter: Payer: Self-pay | Admitting: Oncology

## 2011-10-17 ENCOUNTER — Other Ambulatory Visit (HOSPITAL_BASED_OUTPATIENT_CLINIC_OR_DEPARTMENT_OTHER): Payer: Medicare Other | Admitting: Lab

## 2011-10-17 ENCOUNTER — Telehealth: Payer: Self-pay | Admitting: *Deleted

## 2011-10-17 ENCOUNTER — Ambulatory Visit (HOSPITAL_BASED_OUTPATIENT_CLINIC_OR_DEPARTMENT_OTHER): Payer: Medicare Other

## 2011-10-17 ENCOUNTER — Ambulatory Visit (HOSPITAL_COMMUNITY)
Admission: RE | Admit: 2011-10-17 | Discharge: 2011-10-17 | Disposition: A | Discharge status: Home / Self Care | Payer: Medicare Other | Source: Ambulatory Visit | Attending: Oncology | Admitting: Oncology

## 2011-10-17 VITALS — BP 190/80 | HR 49 | Temp 97.0°F | Ht 60.5 in | Wt 109.9 lb

## 2011-10-17 DIAGNOSIS — C50519 Malignant neoplasm of lower-outer quadrant of unspecified female breast: Secondary | ICD-10-CM

## 2011-10-17 DIAGNOSIS — Z5112 Encounter for antineoplastic immunotherapy: Secondary | ICD-10-CM

## 2011-10-17 DIAGNOSIS — R071 Chest pain on breathing: Secondary | ICD-10-CM | POA: Insufficient documentation

## 2011-10-17 DIAGNOSIS — C50919 Malignant neoplasm of unspecified site of unspecified female breast: Secondary | ICD-10-CM

## 2011-10-17 DIAGNOSIS — I728 Aneurysm of other specified arteries: Secondary | ICD-10-CM | POA: Insufficient documentation

## 2011-10-17 DIAGNOSIS — C7951 Secondary malignant neoplasm of bone: Secondary | ICD-10-CM

## 2011-10-17 DIAGNOSIS — J984 Other disorders of lung: Secondary | ICD-10-CM | POA: Insufficient documentation

## 2011-10-17 DIAGNOSIS — C7981 Secondary malignant neoplasm of breast: Secondary | ICD-10-CM

## 2011-10-17 DIAGNOSIS — C773 Secondary and unspecified malignant neoplasm of axilla and upper limb lymph nodes: Secondary | ICD-10-CM

## 2011-10-17 DIAGNOSIS — J9819 Other pulmonary collapse: Secondary | ICD-10-CM | POA: Insufficient documentation

## 2011-10-17 DIAGNOSIS — M810 Age-related osteoporosis without current pathological fracture: Secondary | ICD-10-CM

## 2011-10-17 DIAGNOSIS — J438 Other emphysema: Secondary | ICD-10-CM | POA: Insufficient documentation

## 2011-10-17 LAB — CBC WITH DIFFERENTIAL/PLATELET
Basophils Absolute: 0 10*3/uL (ref 0.0–0.1)
EOS%: 6.1 % (ref 0.0–7.0)
HCT: 39.6 % (ref 34.8–46.6)
HGB: 13.1 g/dL (ref 11.6–15.9)
MCH: 30.1 pg (ref 25.1–34.0)
MCV: 91 fL (ref 79.5–101.0)
MONO%: 11.5 % (ref 0.0–14.0)
NEUT%: 53.9 % (ref 38.4–76.8)
RDW: 13.6 % (ref 11.2–14.5)

## 2011-10-17 LAB — COMPREHENSIVE METABOLIC PANEL
Alkaline Phosphatase: 54 U/L (ref 39–117)
BUN: 18 mg/dL (ref 6–23)
Creatinine, Ser: 0.87 mg/dL (ref 0.50–1.10)
Glucose, Bld: 86 mg/dL (ref 70–99)
Total Bilirubin: 0.3 mg/dL (ref 0.3–1.2)

## 2011-10-17 MED ORDER — ACETAMINOPHEN 325 MG PO TABS
650.0000 mg | ORAL_TABLET | Freq: Once | ORAL | Status: AC
  Administered 2011-10-17: 650 mg via ORAL

## 2011-10-17 MED ORDER — ZOLEDRONIC ACID 4 MG/5ML IV CONC
3.0000 mg | Freq: Once | INTRAVENOUS | Status: AC
  Administered 2011-10-17: 3 mg via INTRAVENOUS
  Filled 2011-10-17: qty 3.75

## 2011-10-17 MED ORDER — SODIUM CHLORIDE 0.9 % IJ SOLN
10.0000 mL | INTRAMUSCULAR | Status: DC | PRN
  Filled 2011-10-17: qty 10

## 2011-10-17 MED ORDER — HEPARIN SOD (PORK) LOCK FLUSH 100 UNIT/ML IV SOLN
500.0000 [IU] | Freq: Once | INTRAVENOUS | Status: DC | PRN
  Filled 2011-10-17: qty 5

## 2011-10-17 MED ORDER — DIPHENHYDRAMINE HCL 25 MG PO CAPS: 25.0000 mg | ORAL_CAPSULE | Freq: Once | ORAL | Status: DC

## 2011-10-17 MED ORDER — SODIUM CHLORIDE 0.9 % IV SOLN
Freq: Once | INTRAVENOUS | Status: AC
  Administered 2011-10-17: 13:00:00 via INTRAVENOUS

## 2011-10-17 MED ORDER — TRASTUZUMAB CHEMO INJECTION 440 MG
6.0000 mg/kg | Freq: Once | INTRAVENOUS | Status: AC
  Administered 2011-10-17: 294 mg via INTRAVENOUS
  Filled 2011-10-17: qty 14

## 2011-10-17 NOTE — Telephone Encounter (Signed)
Emailed michelle to set up patient's treatments gave patient instructions to get her chest x-ray done on 10-17-2011 in the radiology department set up patient for lab and herceptin appointment

## 2011-10-17 NOTE — Patient Instructions (Addendum)
[facility] Discharge Instructions for Patients Receiving Chemotherapy  Today you received the following chemotherapy agents Zometa and Herceptin  To help prevent nausea and vomiting after your treatment, we encourage you to take your nausea medication Begin taking it at 7 pm and take it as often as prescribed for the next 24 to 72 hours.   If you develop nausea and vomiting that is not controlled by your nausea medication, call the clinic. If it is after clinic hours your family physician or the after hours number for the clinic or go to the Emergency Department.   BELOW ARE SYMPTOMS THAT SHOULD BE REPORTED IMMEDIATELY:  *FEVER GREATER THAN 100.5 F  *CHILLS WITH OR WITHOUT FEVER  NAUSEA AND VOMITING THAT IS NOT CONTROLLED WITH YOUR NAUSEA MEDICATION  *UNUSUAL SHORTNESS OF BREATH  *UNUSUAL BRUISING OR BLEEDING  TENDERNESS IN MOUTH AND THROAT WITH OR WITHOUT PRESENCE OF ULCERS  *URINARY PROBLEMS  *BOWEL PROBLEMS  UNUSUAL RASH Items with * indicate a potential emergency and should be followed up as soon as possible.  One of the nurses will contact you 24 hours after your treatment. Please let the nurse know about any problems that you may have experienced. Feel free to call the clinic you have any questions or concerns. The clinic phone number is [phone].   I have been informed and understand all the instructions given to me. I know to contact the clinic, my physician, or go to the Emergency Department if any problems should occur. I do not have any questions at this time, but understand that I may call the clinic during office hours   should I have any questions or need assistance in obtaining follow up care.    __________________________________________  _____________  __________ Signature of Patient or Authorized Representative            Date                   Time    __________________________________________ Nurse's Signature

## 2011-10-17 NOTE — Progress Notes (Signed)
ID: [name]   DOB: March 30, 1914  MR#: [id number]  WUJ#:[id number]  HISTORY OF PRESENT ILLNESS: [name] gynecologist, Dr. Conley Simmonds, palpated a mass in the patient's left breast in 2004. The patient has a daughter, who is a Engineer, civil (consulting), formerly I believe an oncology nurse, currently a pulmonary nurse in [place], so the patient went there for her care, and while I do not have all the workup, I do have the pathology report from October 01, 2002, which was her left lumpectomy and sentinel lymph node sampling under Dr. Rexene Edison. This showed [phone] a 2.5 cm infiltrating ductal carcinoma with lobular features (the cells were E-cadherin positive), involving one of three sentinel lymph nodes. The anterior margin was close at less than 1 mm. The patient saw Dr. Chipper Herb here, and the option of completion mastectomy versus radiotherapy alone was discussed. He felt, and I would probably have agreed, that despite the very close margin, which was nevertheless technically negative, proceeding to radiation without "clearing the margin" further would be adequate. Accordingly, the patient did receive a total of 5040 cGy with an additional 1000 cGy boost to the left breast between September 11 and February 24, 2003.  The patient then did well, but in 2007 developed some redness and bumpiness around the left areola. She brought this to Dr. Wilfred Lacy attention, and apparently an initial biopsy was negative, as was a mammogram, however, with further development of the change, the patient had a biopsy under anesthesia, and this apparently was positive (I do not have those records). With this information, she proceeded to left mastectomy on February 26, 2006. 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 The closest margin being 1.2 cm from the inferior margin. Everything else being amply negative. There were no discrete masses or  lesions identified in the left breast. The patient subsequently underwent left mastectomy.  There were a series of further skin recurrences, first in November of 2008. Initially, these were resected, but later there were too many lesions to resect, in addition to a large left axillary lymph node. This was biopsied in January 2011, showing the tumor now to be HER-2/neu positive. Patient has been on anti-HER-2/neu treatments since January 2011, initially with trastuzumab plus lapatinib. Lapatinib was discontinued in April 2011 secondary to side effects. Continues now to receive trastuzumab on a q. 3 week basis. Also receives a zoledronic acid every 3 months.    INTERVAL HISTORY: The patient returns of with her daughter-in-law [name] for followup of her breast cancer. The interval history is significant for the patient having fallen in March and injured her ribs. She took some Tylenol and used some Lidoderm for the pain. That is now considerably better.  REVIEW OF SYSTEMS: However, approximately a week ago, while lifting a bag of groceries, she developed right flank pain, which persists, with a pleuritic component. She tells me this is getting better, but if she takes a deep breath or stretches her right arm 4 of it is very uncomfortable over the scapular area. There is no pain in the spine itself. Aside from these issues she sleeps poorly, describes herself is moderately fatigued, is extremely worried about her macular degeneration, has some mild sinus problems, urinary incontinence, and some scattered joint and back pains. None of these are new or worsening symptoms. A detailed review of systems was otherwise stable.  PAST MEDICAL HISTORY: Past Medical History  Diagnosis Date  . Cancer  breast  . Cataract   . CAD (coronary artery disease)     cath w/ trivial disease  . Diverticulosis of colon   . Osteoporosis     s/p compression fx T12  . Macular degeneration     PAST SURGICAL HISTORY: Past  Surgical History  Procedure Date  . Abdominal hysterectomy   . Breast lumpectomy   . Excision for paget's vulvar   . Vertbroplasty     T12    FAMILY HISTORY The patient's father died in an automobile accident at 81, the patient's mother from a stroke at 57. The patient had one brother who died from causes unknown to her at the age of 52, and a sister who had "lots of problems" and died at the age of 17, but there is no history of breast or ovarian cancer in the family.   GYNECOLOGIC HISTORY: She is GX P3. She took hormone replacement therapy briefly after menopause.   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 She has been a widow since 1997; lives independently at [facility], drives, and does all of her activities of daily living. Her daughter, [name], is a Engineer, civil (consulting) in [place]. Her daughter, [name], lives in [place], and she is here today with [name], her daughter-in-law, married to the patient's son, who is also her power of attorney. He works for the [employer]. The patient has six grandchildren and three great-grandchildren. She attends [employer].     ADVANCED DIRECTIVES: in place  HEALTH MAINTENANCE: History  Substance Use Topics  . Smoking status: Former Smoker    Quit date: 05/01/1990  . Smokeless tobacco: Never Used  . Alcohol Use: No     Colonoscopy:  PAP:  Bone density:  Lipid panel:  Allergies  Allergen Reactions  . Solifenacin Succinate     REACTION: "sick" nausea    Current Outpatient Prescriptions  Medication Sig Dispense Refill  . acetaminophen (TYLENOL) 500 MG tablet Take 500 mg by mouth at bedtime.        . Ascorbic Acid (VITAMIN C) 100 MG tablet Take 100 mg by mouth daily.        . DiphenhydrAMINE HCl (BENADRYL ALLERGY PO) Take by mouth as needed.       . fish oil-omega-3 fatty acids 1000 MG capsule Take 2 g by mouth daily.        . metoprolol  succinate (TOPROL-XL) 50 MG 24 hr tablet Take 1 tablet (50 mg total) by mouth daily.  90 tablet  3  . Multiple Vitamin (MULTIVITAMIN PO) Take by mouth.        . trastuzumab (HERCEPTIN) 440 MG injection Inject into the vein once.        . zolendronic acid (ZOMETA) 4 MG/5ML injection Inject 4 mg into the vein every 3 (three) months.         No current facility-administered medications for this visit.   Facility-Administered Medications Ordered in Other Visits  Medication Dose Route Frequency Provider Last Rate Last Dose  . 0.9 %  sodium chloride infusion   Intravenous Once Catalina Gravel, PA      . acetaminophen (TYLENOL) tablet 650 mg  650 mg Oral Once Catalina Gravel, PA   650 mg at 10/17/11 1225  . diphenhydrAMINE (BENADRYL) capsule 25 mg  25 mg Oral Once [employer], PA      . heparin lock flush 100  unit/mL  500 Units Intracatheter Once PRN Catalina Gravel, PA      . sodium chloride 0.9 % injection 10 mL  10 mL Intracatheter PRN Amy Allegra Grana, PA      . trastuzumab (HERCEPTIN) 294 mg in sodium chloride 0.9 % 250 mL chemo infusion  6 mg/kg (Treatment Plan Actual) Intravenous Once Catalina Gravel, PA   294 mg at 10/17/11 1241  . zolendronic acid (ZOMETA) 3 mg in sodium chloride 0.9 % 100 mL IVPB  3 mg Intravenous Once Lowella Dell, MD   3 mg at 10/17/11 1320    OBJECTIVE: Elderly white woman who appears anxious Filed Vitals:   10/17/11 1056  BP: 190/80  Pulse: 49  Temp: 97 F (36.1 C)     Body mass index is 21.11 kg/(m^2).    ECOG FS: 2  Sclerae unicteric Oropharynx clear No peripheral adenopathy Lungs no rales or rhonchi, clear to auscultation bilaterally; breath sounds apparent at both apices Heart regular rate and rhythm Abd benign, soft, nontender, positive bowel sounds MSK no focal spinal tenderness, no peripheral edema Skin is benign Neuro: nonfocal Breasts: Right breast unremarkable, no masses, skin changes, or nipple inversion. Patient status post left mastectomy.. No evidence of  active local  Recurrence over the left chest wall.  LAB RESULTS: Lab Results  Component Value Date   WBC 4.6 10/17/2011   NEUTROABS 2.5 10/17/2011   HGB 13.1 10/17/2011   HCT 39.6 10/17/2011   MCV 91.0 10/17/2011   PLT 137* 10/17/2011      Chemistry      Component Value Date/Time   NA 140 10/17/2011 1037   K 4.0 10/17/2011 1037   CL 105 10/17/2011 1037   CO2 28 10/17/2011 1037   BUN 18 10/17/2011 1037   CREATININE 0.87 10/17/2011 1037      Component Value Date/Time   CALCIUM 10.0 10/17/2011 1037   ALKPHOS 54 10/17/2011 1037   AST 32 10/17/2011 1037   ALT 17 10/17/2011 1037   BILITOT 0.3 10/17/2011 1037       Lab Results  Component Value Date   LABCA2 17 09/26/2011     STUDIES: Dg Chest 2 View  10/17/2011  *RADIOLOGY REPORT*  Clinical Data: History of breast cancer.  Pleuritic right-sided chest pain.  CHEST - 2 VIEW  Comparison: 06/14/2011.  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  Left lower lobe discoid atelectasis is present.  There is a vague density in the right base on the frontal view which was not definitively present on the prior exam.  This may represent atelectasis however it is difficult to exclude a pulmonary nodule in this patient with history of malignancy.  Borderline heart size.  Emphysema is present.  There is no airspace disease or displaced rib fracture identified.  No pneumothorax. Emphysema is present.  IMPRESSION:  1.  No displaced rib fractures or pneumothorax. 2.  Emphysema. 3.  Vague right basilar density is present, which may represent atelectasis.  Repeat PA and lateral chest in 8 weeks is recommended for reassessment.  Original Report Authenticated By: Andreas Newport, M.D.   ASSESSMENT: 78 y.o.   woman  (1) status post left lumpectomy and sentinel lymph node dissection June of 2004 for a T2 N1 (Stage II) invasive ductal carcinoma, triple negative, status post radiation therapy completed October of 2004    (2) local recurrence around the areola October 2007, leading to mastectomy;  (3) then a series of  further skin recurrences first noted November of 2008. Initially these were resected, later with too many lesions to resect; there was also a large left axillary lymph node which was biopsied January of 2011 showing the tumor now to be HER-2 positive.  (4) Since January 2011 she has been on anti-HER-2 treatment, initially with trastuzumab plus lapatinib, with a lapatinib discontinued April of 2011 because of side effects.  (5) continues to receive trastuzumab every 3 weeks and zoledronic acid every 3 months.   PLAN: She continues to be in remission and the plan is to continue the current treatment indefinitely. She is already scheduled for repeat echocardiogram in July. The chest x-ray above does not show any pneumothorax, which was my concern. I think she just pulled a muscle and indeed per her report her symptoms of right shoulder discomfort are getting better. She knows to call for any problems that may develop before the next visit, which will be 3 months from now.  Fronnie Urton C    10/17/2011

## 2011-10-29 ENCOUNTER — Encounter (HOSPITAL_COMMUNITY): Payer: Self-pay

## 2011-10-29 ENCOUNTER — Ambulatory Visit (HOSPITAL_COMMUNITY)
Admission: RE | Admit: 2011-10-29 | Discharge: 2011-10-29 | Disposition: A | Discharge status: Home / Self Care | Payer: Medicare Other | Source: Ambulatory Visit | Attending: Physician Assistant | Admitting: Physician Assistant

## 2011-10-29 ENCOUNTER — Ambulatory Visit (HOSPITAL_COMMUNITY)
Admission: RE | Admit: 2011-10-29 | Discharge: 2011-10-29 | Disposition: A | Discharge status: Home / Self Care | Payer: Medicare Other | Source: Ambulatory Visit | Attending: Internal Medicine | Admitting: Internal Medicine

## 2011-10-29 VITALS — BP 150/70 | HR 43 | Ht 60.0 in | Wt 111.0 lb

## 2011-10-29 DIAGNOSIS — K5730 Diverticulosis of large intestine without perforation or abscess without bleeding: Secondary | ICD-10-CM | POA: Insufficient documentation

## 2011-10-29 DIAGNOSIS — I2510 Atherosclerotic heart disease of native coronary artery without angina pectoris: Secondary | ICD-10-CM | POA: Insufficient documentation

## 2011-10-29 DIAGNOSIS — M810 Age-related osteoporosis without current pathological fracture: Secondary | ICD-10-CM | POA: Insufficient documentation

## 2011-10-29 DIAGNOSIS — I10 Essential (primary) hypertension: Secondary | ICD-10-CM | POA: Insufficient documentation

## 2011-10-29 DIAGNOSIS — I359 Nonrheumatic aortic valve disorder, unspecified: Secondary | ICD-10-CM

## 2011-10-29 DIAGNOSIS — C50919 Malignant neoplasm of unspecified site of unspecified female breast: Secondary | ICD-10-CM | POA: Insufficient documentation

## 2011-10-29 DIAGNOSIS — H3530 Unspecified macular degeneration: Secondary | ICD-10-CM | POA: Insufficient documentation

## 2011-10-29 DIAGNOSIS — I059 Rheumatic mitral valve disease, unspecified: Secondary | ICD-10-CM | POA: Insufficient documentation

## 2011-10-29 DIAGNOSIS — Z9010 Acquired absence of unspecified breast and nipple: Secondary | ICD-10-CM | POA: Insufficient documentation

## 2011-10-29 NOTE — Patient Instructions (Addendum)
Will call you with echocardiogram results.  Follow up 3 months with echo.  Your physician has requested that you have an echocardiogram. Echocardiography is a painless test that uses sound waves to create images of your heart. It provides your doctor with information about the size and shape of your heart and how well your heart's chambers and valves are working. This procedure takes approximately one hour. There are no restrictions for this procedure.

## 2011-10-29 NOTE — Progress Notes (Signed)
HPI:  [name] is a delightful 78 y/o woman referred by Dr. Darnelle Catalan for cardiac f/u in setting of Herceptin therapy.  Had cath in 2000 with no significant CAD. Found to have breast CA in 2004. Initial lumpectomy in 2004. There was evidence of recurrence. And then underwent mastectomy in 2006 at [facility]. Then treated with XRT. Had recurrence in L axillary node in 2010. Apparently the initial tumor was negative for ER/PR/Her 2-neu receptors. However lymph nod HER 2-neu + though to be mutation.   Started on Tykerb and Herceptin. Treated with Tykerb x 6 months.  Continues on Herceptin q 3 weeks (3 years in at this time)  Echos  08/2010: EF 55-60% lat s' hard to see about 8 cm/s  03/2011: EF 55-60% lateral s' velocity 9.2 cm/s 07/2011: EF 55-60% lateral s' 9.3-9.5 cm/s (difficult window) 10/2011:  EF 55-60% lateral s' 8.9, 9.2 cm/s  Here for routine follow up today with her son.  She says she feels well today.  She continues to have pain in her right side but is improving.  She is doing well with her herceptin therapy, she currently has completed 3 years of therapy and will do so indefinitely at this point in time.  She denies CP, orthopnea, PND or edema.  Upon review her BP is elevated during office visits but she checks it once a week at [facility] and SBP usually around 128.  She denies HA, presyncope, syncope.     Past Medical History  Diagnosis Date  . Cancer     breast  . Cataract   . CAD (coronary artery disease)     cath w/ trivial disease  . Diverticulosis of colon   . Osteoporosis     s/p compression fx T12  . Macular degeneration    Current Outpatient Prescriptions on File Prior to Encounter  Medication Sig Dispense Refill  . acetaminophen (TYLENOL) 500 MG tablet Take 500 mg by mouth at bedtime.        . Ascorbic Acid (VITAMIN C) 100 MG tablet Take 100 mg by mouth daily.        . fish oil-omega-3 fatty acids 1000 MG capsule Take 2 g by mouth daily.        . metoprolol succinate  (TOPROL-XL) 50 MG 24 hr tablet Take 1 tablet (50 mg total) by mouth daily.  90 tablet  3  . Multiple Vitamin (MULTIVITAMIN PO) Take by mouth.        . trastuzumab (HERCEPTIN) 440 MG injection Inject into the vein once.        . zolendronic acid (ZOMETA) 4 MG/5ML injection Inject 4 mg into the vein every 3 (three) months.          Review of Systems: System review is negative for any constitutional, cardiac, pulmonary, GI or neuro symptoms or complaints other than as described in the HPI.     Objective:    Filed Vitals:   10/29/11 1032  BP: 150/70  Pulse: 43  Height: 5' (1.524 m)  Weight: 111 lb (50.349 kg)  SpO2: 96%   Physical Exam: Elderly but ambulates without difficulyy General:  Well appearing. No resp difficulty HEENT: normal Neck: supple. JVD flat. Carotids 2+ bilat; no bruits. No lymphadenopathy or thryomegaly appreciated. Cor: PMI nondisplaced. Regular rate & rhythm. Soft diastolic murmur at RSB Lungs: clear Abdomen: soft, nontender, nondistended. No hepatosplenomegaly. No bruits or masses. Good bowel sounds. Extremities: no cyanosis, clubbing, rash, edema Neuro: alert & orientedx3, cranial nerves  grossly intact. moves all 4 extremities w/o difficulty. Affect pleasant     Assessment & Plan:

## 2011-10-29 NOTE — Assessment & Plan Note (Addendum)
Echo reviewed and appears stable will continue herceptin therapy at this time.  Follow up 3 months with repeat echo.

## 2011-10-29 NOTE — Assessment & Plan Note (Addendum)
Per patient her BP is only increased when she is at the doctor's office.  SBP is always less than 140 (usually around 130) when she takes it weekly at [facility].  Have discussed with Dr. Gala Romney and will continue to monitor.  If outside BP is above 140 the patient will call.

## 2011-10-29 NOTE — Progress Notes (Signed)
  Echocardiogram 2D Echocardiogram has been performed.  Georgian Co 10/29/2011, 9:59 AM

## 2011-11-07 ENCOUNTER — Ambulatory Visit (HOSPITAL_BASED_OUTPATIENT_CLINIC_OR_DEPARTMENT_OTHER): Payer: Medicare Other

## 2011-11-07 ENCOUNTER — Other Ambulatory Visit (HOSPITAL_BASED_OUTPATIENT_CLINIC_OR_DEPARTMENT_OTHER): Payer: Medicare Other | Admitting: Lab

## 2011-11-07 VITALS — BP 170/73 | HR 48 | Temp 96.9°F

## 2011-11-07 DIAGNOSIS — C7981 Secondary malignant neoplasm of breast: Secondary | ICD-10-CM

## 2011-11-07 DIAGNOSIS — C7951 Secondary malignant neoplasm of bone: Secondary | ICD-10-CM

## 2011-11-07 DIAGNOSIS — C50919 Malignant neoplasm of unspecified site of unspecified female breast: Secondary | ICD-10-CM

## 2011-11-07 DIAGNOSIS — C50519 Malignant neoplasm of lower-outer quadrant of unspecified female breast: Secondary | ICD-10-CM

## 2011-11-07 DIAGNOSIS — C7952 Secondary malignant neoplasm of bone marrow: Secondary | ICD-10-CM

## 2011-11-07 DIAGNOSIS — Z5112 Encounter for antineoplastic immunotherapy: Secondary | ICD-10-CM

## 2011-11-07 LAB — COMPREHENSIVE METABOLIC PANEL
ALT: 11 U/L (ref 0–35)
AST: 28 U/L (ref 0–37)
Albumin: 4.2 g/dL (ref 3.5–5.2)
CO2: 29 mEq/L (ref 19–32)
Calcium: 10 mg/dL (ref 8.4–10.5)
Chloride: 108 mEq/L (ref 96–112)
Potassium: 4.4 mEq/L (ref 3.5–5.3)
Sodium: 143 mEq/L (ref 135–145)
Total Protein: 6.6 g/dL (ref 6.0–8.3)

## 2011-11-07 LAB — CBC WITH DIFFERENTIAL/PLATELET
BASO%: 0.7 % (ref 0.0–2.0)
Eosinophils Absolute: 0.3 10*3/uL (ref 0.0–0.5)
MCHC: 32.8 g/dL (ref 31.5–36.0)
MONO#: 0.5 10*3/uL (ref 0.1–0.9)
NEUT#: 2.5 10*3/uL (ref 1.5–6.5)
RBC: 4.61 10*6/uL (ref 3.70–5.45)
WBC: 4.3 10*3/uL (ref 3.9–10.3)
lymph#: 1 10*3/uL (ref 0.9–3.3)
nRBC: 0 % (ref 0–0)

## 2011-11-07 MED ORDER — SODIUM CHLORIDE 0.9 % IJ SOLN
10.0000 mL | INTRAMUSCULAR | Status: DC | PRN
  Filled 2011-11-07: qty 10

## 2011-11-07 MED ORDER — ACETAMINOPHEN 325 MG PO TABS
650.0000 mg | ORAL_TABLET | Freq: Once | ORAL | Status: AC
  Administered 2011-11-07: 650 mg via ORAL

## 2011-11-07 MED ORDER — SODIUM CHLORIDE 0.9 % IV SOLN
Freq: Once | INTRAVENOUS | Status: AC
  Administered 2011-11-07: 12:00:00 via INTRAVENOUS

## 2011-11-07 MED ORDER — HEPARIN SOD (PORK) LOCK FLUSH 100 UNIT/ML IV SOLN
500.0000 [IU] | Freq: Once | INTRAVENOUS | Status: DC | PRN
  Filled 2011-11-07: qty 5

## 2011-11-07 MED ORDER — TRASTUZUMAB CHEMO INJECTION 440 MG
6.0000 mg/kg | Freq: Once | INTRAVENOUS | Status: AC
  Administered 2011-11-07: 294 mg via INTRAVENOUS
  Filled 2011-11-07: qty 14

## 2011-11-07 NOTE — Patient Instructions (Signed)
[facility] Discharge Instructions for Patients Receiving Chemotherapy  Today you received the following chemotherapy agents Herceptin.  To help prevent nausea and vomiting after your treatment, we encourage you to take your nausea medication.   If you develop nausea and vomiting that is not controlled by your nausea medication, call the clinic. If it is after clinic hours your family physician or the after hours number for the clinic or go to the Emergency Department.   BELOW ARE SYMPTOMS THAT SHOULD BE REPORTED IMMEDIATELY:  *FEVER GREATER THAN 100.5 F  *CHILLS WITH OR WITHOUT FEVER  NAUSEA AND VOMITING THAT IS NOT CONTROLLED WITH YOUR NAUSEA MEDICATION  *UNUSUAL SHORTNESS OF BREATH  *UNUSUAL BRUISING OR BLEEDING  TENDERNESS IN MOUTH AND THROAT WITH OR WITHOUT PRESENCE OF ULCERS  *URINARY PROBLEMS  *BOWEL PROBLEMS  UNUSUAL RASH Items with * indicate a potential emergency and should be followed up as soon as possible.  One of the nurses will contact you 24 hours after your treatment. Please let the nurse know about any problems that you may have experienced. Feel free to call the clinic you have any questions or concerns. The clinic phone number is [phone].   I have been informed and understand all the instructions given to me. I know to contact the clinic, my physician, or go to the Emergency Department if any problems should occur. I do not have any questions at this time, but understand that I may call the clinic during office hours   should I have any questions or need assistance in obtaining follow up care.    __________________________________________  _____________  __________ Signature of Patient or Authorized Representative            Date                   Time    __________________________________________ Nurse's Signature    

## 2011-11-28 ENCOUNTER — Other Ambulatory Visit (HOSPITAL_BASED_OUTPATIENT_CLINIC_OR_DEPARTMENT_OTHER): Payer: Medicare Other | Admitting: Lab

## 2011-11-28 ENCOUNTER — Ambulatory Visit (HOSPITAL_BASED_OUTPATIENT_CLINIC_OR_DEPARTMENT_OTHER): Payer: Medicare Other

## 2011-11-28 VITALS — BP 181/81 | HR 49 | Temp 97.5°F

## 2011-11-28 DIAGNOSIS — C50519 Malignant neoplasm of lower-outer quadrant of unspecified female breast: Secondary | ICD-10-CM

## 2011-11-28 DIAGNOSIS — Z5112 Encounter for antineoplastic immunotherapy: Secondary | ICD-10-CM

## 2011-11-28 DIAGNOSIS — C50919 Malignant neoplasm of unspecified site of unspecified female breast: Secondary | ICD-10-CM

## 2011-11-28 DIAGNOSIS — C7952 Secondary malignant neoplasm of bone marrow: Secondary | ICD-10-CM

## 2011-11-28 LAB — COMPREHENSIVE METABOLIC PANEL
AST: 31 U/L (ref 0–37)
Albumin: 4.4 g/dL (ref 3.5–5.2)
Alkaline Phosphatase: 51 U/L (ref 39–117)
BUN: 13 mg/dL (ref 6–23)
Creatinine, Ser: 1.05 mg/dL (ref 0.50–1.10)
Potassium: 4.3 mEq/L (ref 3.5–5.3)

## 2011-11-28 LAB — CBC WITH DIFFERENTIAL/PLATELET
BASO%: 0.4 % (ref 0.0–2.0)
EOS%: 4.9 % (ref 0.0–7.0)
HCT: 40.3 % (ref 34.8–46.6)
MCH: 30.2 pg (ref 25.1–34.0)
MCHC: 33.3 g/dL (ref 31.5–36.0)
NEUT%: 61.2 % (ref 38.4–76.8)
RBC: 4.43 10*6/uL (ref 3.70–5.45)
RDW: 13.8 % (ref 11.2–14.5)
lymph#: 1.2 10*3/uL (ref 0.9–3.3)
nRBC: 0 % (ref 0–0)

## 2011-11-28 MED ORDER — SODIUM CHLORIDE 0.9 % IV SOLN: Freq: Once | INTRAVENOUS | Status: DC | PRN

## 2011-11-28 MED ORDER — ALBUTEROL SULFATE (2.5 MG/3ML) 0.083% IN NEBU
2.5000 mg | INHALATION_SOLUTION | Freq: Once | RESPIRATORY_TRACT | Status: DC | PRN
  Filled 2011-11-28: qty 3

## 2011-11-28 MED ORDER — TRASTUZUMAB CHEMO INJECTION 440 MG
6.0000 mg/kg | Freq: Once | INTRAVENOUS | Status: AC
  Administered 2011-11-28: 294 mg via INTRAVENOUS
  Filled 2011-11-28: qty 14

## 2011-11-28 MED ORDER — HEPARIN SOD (PORK) LOCK FLUSH 100 UNIT/ML IV SOLN
500.0000 [IU] | Freq: Once | INTRAVENOUS | Status: DC | PRN
  Filled 2011-11-28: qty 5

## 2011-11-28 MED ORDER — SODIUM CHLORIDE 0.9 % IJ SOLN
3.0000 mL | INTRAMUSCULAR | Status: DC | PRN
  Filled 2011-11-28: qty 10

## 2011-11-28 MED ORDER — EPINEPHRINE HCL 0.1 MG/ML IJ SOLN
0.2500 mg | Freq: Once | INTRAMUSCULAR | Status: DC | PRN
  Filled 2011-11-28: qty 10

## 2011-11-28 MED ORDER — DIPHENHYDRAMINE HCL 50 MG/ML IJ SOLN: 25.0000 mg | Freq: Once | INTRAMUSCULAR | Status: DC | PRN

## 2011-11-28 MED ORDER — SODIUM CHLORIDE 0.9 % IJ SOLN
10.0000 mL | INTRAMUSCULAR | Status: DC | PRN
  Filled 2011-11-28: qty 10

## 2011-11-28 MED ORDER — METHYLPREDNISOLONE SODIUM SUCC 125 MG IJ SOLR: 125.0000 mg | Freq: Once | INTRAMUSCULAR | Status: DC | PRN

## 2011-11-28 MED ORDER — DIPHENHYDRAMINE HCL 50 MG/ML IJ SOLN: 50.0000 mg | Freq: Once | INTRAMUSCULAR | Status: DC | PRN

## 2011-11-28 MED ORDER — HEPARIN SOD (PORK) LOCK FLUSH 100 UNIT/ML IV SOLN
250.0000 [IU] | Freq: Once | INTRAVENOUS | Status: DC | PRN
  Filled 2011-11-28: qty 5

## 2011-11-28 MED ORDER — ALTEPLASE 2 MG IJ SOLR
2.0000 mg | Freq: Once | INTRAMUSCULAR | Status: DC | PRN
  Filled 2011-11-28: qty 2

## 2011-11-28 MED ORDER — ACETAMINOPHEN 325 MG PO TABS
650.0000 mg | ORAL_TABLET | Freq: Once | ORAL | Status: AC
  Administered 2011-11-28: 650 mg via ORAL

## 2011-11-28 MED ORDER — DIPHENHYDRAMINE HCL 25 MG PO CAPS
25.0000 mg | ORAL_CAPSULE | Freq: Once | ORAL | Status: DC
Start: 2011-11-28 — End: 2011-11-28

## 2011-11-28 MED ORDER — SODIUM CHLORIDE 0.9 % IV SOLN
Freq: Once | INTRAVENOUS | Status: AC
  Administered 2011-11-28: 11:00:00 via INTRAVENOUS

## 2011-11-28 NOTE — Patient Instructions (Signed)
[facility] Discharge Instructions for Patients Receiving Chemotherapy  Today you received the following chemotherapy agents Herceptin To help prevent nausea and vomiting after your treatment, we encourage you to take your nausea medication as per Dr. Darnelle Catalan If you develop nausea and vomiting that is not controlled by your nausea medication, call the clinic. If it is after clinic hours your family physician or the after hours number for the clinic or go to the Emergency Department.   BELOW ARE SYMPTOMS THAT SHOULD BE REPORTED IMMEDIATELY:  *FEVER GREATER THAN 100.5 F  *CHILLS WITH OR WITHOUT FEVER  NAUSEA AND VOMITING THAT IS NOT CONTROLLED WITH YOUR NAUSEA MEDICATION  *UNUSUAL SHORTNESS OF BREATH  *UNUSUAL BRUISING OR BLEEDING  TENDERNESS IN MOUTH AND THROAT WITH OR WITHOUT PRESENCE OF ULCERS  *URINARY PROBLEMS  *BOWEL PROBLEMS  UNUSUAL RASH Items with * indicate a potential emergency and should be followed up as soon as possible.  . Feel free to call the clinic you have any questions or concerns. The clinic phone number is [phone].   I have been informed and understand all the instructions given to me. I know to contact the clinic, my physician, or go to the Emergency Department if any problems should occur. I do not have any questions at this time, but understand that I may call the clinic during office hours   should I have any questions or need assistance in obtaining follow up care.    __________________________________________  _____________  __________ Signature of Patient or Authorized Representative            Date                   Time    __________________________________________ Nurse's Signature

## 2011-12-06 ENCOUNTER — Encounter: Payer: Self-pay | Admitting: Internal Medicine

## 2011-12-06 ENCOUNTER — Ambulatory Visit (INDEPENDENT_AMBULATORY_CARE_PROVIDER_SITE_OTHER): Payer: Medicare Other | Admitting: Internal Medicine

## 2011-12-06 VITALS — BP 162/80 | HR 49 | Temp 97.0°F | Resp 12 | Wt 107.0 lb

## 2011-12-06 DIAGNOSIS — I10 Essential (primary) hypertension: Secondary | ICD-10-CM

## 2011-12-06 DIAGNOSIS — R252 Cramp and spasm: Secondary | ICD-10-CM

## 2011-12-06 MED ORDER — FUROSEMIDE 20 MG PO TABS: 20.0000 mg | ORAL_TABLET | Freq: Every day | ORAL | Status: DC

## 2011-12-06 NOTE — Assessment & Plan Note (Signed)
Systolic hypertension in office and worse at home. She has been asymptomatic. Normal exam.  Plan Continue metoprolol  Add furosemide 20 mg daily  BP recheck 1 week

## 2011-12-06 NOTE — Assessment & Plan Note (Signed)
Good relief with tonic water 8 oz bid but she is concerned for pruritus as a side effect - outline by Dr. Adrian Blackwater in the [facility].  Plan  Trial of reduced amount of tonic water - to 8 oz once a day.

## 2011-12-06 NOTE — Patient Instructions (Addendum)
Systolic hypertension - elevated top number.  Plan - continue the metoprolol  Add furosemide 20 mg once a day for control of the systolic blood pressure  Tonic water and itching - possible but not probable. Plan - try reducing tonic water to 8 oz per day  For itching, if it is bad, you may take generic benadryl 25 mg every 6 hours as needed. It may make you drowsy.

## 2011-12-06 NOTE — Progress Notes (Signed)
  Subjective:    Patient ID: [name], female    DOB: Oct 12, 1913, 78 y.o.   MRN: [id number]  HPI Mrs. Haub presents for persistently elevated blood pressure - usually systolic blood pressure. She has been asymptomatic.  She reports that she will have migrating pruritus which she is concerned may be related to tonic water.  Past Medical History  Diagnosis Date  . Cancer     breast  . Cataract   . CAD (coronary artery disease)     cath w/ trivial disease  . Diverticulosis of colon   . Osteoporosis     s/p compression fx T12  . Macular degeneration    Past Surgical History  Procedure Date  . Abdominal hysterectomy   . Breast lumpectomy   . Excision for paget's vulvar   . Vertbroplasty     T12   No family history on file. History   Social History  . Marital Status: Widowed    Spouse Name: N/A    Number of Children: N/A  . Years of Education: N/A   Occupational History  . Not on file.   Social History Main Topics  . Smoking status: Former Smoker    Quit date: 05/01/1990  . Smokeless tobacco: Never Used  . Alcohol Use: No  . Drug Use: No  . Sexually Active: No   Other Topics Concern  . Not on file   Social History Narrative   HSG, no college. Married '44- '97 widowed. 2 dtrs - '46, '54, 1 son - '49; 6 grandchildren; 5 great-grands. 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 She lives alone. End of life care: Do not resuscitate; willing to have intensive care excluding mechanical ventilation.        Review of Systems System review is negative for any constitutional, cardiac, pulmonary, GI or neuro symptoms or complaints other than as described in the HPI.     Objective:   Physical Exam Filed Vitals:   12/06/11 1154  BP: 162/80  Pulse: 49  Temp: 97 F (36.1 C)  Resp: 12   Gen'l - thin well preserved elderly white woman in no distress HEENT- C&S clear, PERRLA Cor- RRR Pulm - normal respirations Neuro- A&O x 3, normal  cognition, normal gait       Assessment & Plan:

## 2011-12-19 ENCOUNTER — Other Ambulatory Visit (HOSPITAL_BASED_OUTPATIENT_CLINIC_OR_DEPARTMENT_OTHER): Payer: Medicare Other | Admitting: Lab

## 2011-12-19 ENCOUNTER — Ambulatory Visit (HOSPITAL_BASED_OUTPATIENT_CLINIC_OR_DEPARTMENT_OTHER): Payer: Medicare Other

## 2011-12-19 VITALS — BP 160/75 | HR 44 | Temp 97.6°F | Resp 20

## 2011-12-19 DIAGNOSIS — Z5112 Encounter for antineoplastic immunotherapy: Secondary | ICD-10-CM

## 2011-12-19 DIAGNOSIS — C50519 Malignant neoplasm of lower-outer quadrant of unspecified female breast: Secondary | ICD-10-CM

## 2011-12-19 DIAGNOSIS — C50919 Malignant neoplasm of unspecified site of unspecified female breast: Secondary | ICD-10-CM

## 2011-12-19 DIAGNOSIS — C7951 Secondary malignant neoplasm of bone: Secondary | ICD-10-CM

## 2011-12-19 LAB — CBC WITH DIFFERENTIAL/PLATELET
Basophils Absolute: 0 10*3/uL (ref 0.0–0.1)
EOS%: 5.4 % (ref 0.0–7.0)
HCT: 42.2 % (ref 34.8–46.6)
HGB: 13.9 g/dL (ref 11.6–15.9)
MCH: 29.8 pg (ref 25.1–34.0)
MCV: 90.4 fL (ref 79.5–101.0)
MONO%: 11.3 % (ref 0.0–14.0)
NEUT%: 57.7 % (ref 38.4–76.8)

## 2011-12-19 LAB — COMPREHENSIVE METABOLIC PANEL
Alkaline Phosphatase: 46 U/L (ref 39–117)
BUN: 18 mg/dL (ref 6–23)
Glucose, Bld: 81 mg/dL (ref 70–99)
Total Bilirubin: 0.5 mg/dL (ref 0.3–1.2)

## 2011-12-19 MED ORDER — TRASTUZUMAB CHEMO INJECTION 440 MG
6.0000 mg/kg | Freq: Once | INTRAVENOUS | Status: AC
  Administered 2011-12-19: 294 mg via INTRAVENOUS
  Filled 2011-12-19: qty 14

## 2011-12-19 MED ORDER — SODIUM CHLORIDE 0.9 % IV SOLN
Freq: Once | INTRAVENOUS | Status: AC
  Administered 2011-12-19: 50 mL via INTRAVENOUS

## 2011-12-19 MED ORDER — ACETAMINOPHEN 325 MG PO TABS
650.0000 mg | ORAL_TABLET | Freq: Once | ORAL | Status: AC
  Administered 2011-12-19: 650 mg via ORAL

## 2011-12-19 NOTE — Patient Instructions (Addendum)
[facility] Discharge Instructions for Patients Receiving Chemotherapy  Today you received the following chemotherapy agent Herceptin.   To help prevent nausea and vomiting after your treatment, we encourage you to take your nausea medication.  Begin taking it as often as prescribed for by Dr. Magrinat.    If you develop nausea and vomiting that is not controlled by your nausea medication, call the clinic. If it is after clinic hours your family physician or the after hours number for the clinic or go to the Emergency Department.   BELOW ARE SYMPTOMS THAT SHOULD BE REPORTED IMMEDIATELY:  *FEVER GREATER THAN 100.5 F  *CHILLS WITH OR WITHOUT FEVER  NAUSEA AND VOMITING THAT IS NOT CONTROLLED WITH YOUR NAUSEA MEDICATION  *UNUSUAL SHORTNESS OF BREATH  *UNUSUAL BRUISING OR BLEEDING  TENDERNESS IN MOUTH AND THROAT WITH OR WITHOUT PRESENCE OF ULCERS  *URINARY PROBLEMS  *BOWEL PROBLEMS  UNUSUAL RASH Items with * indicate a potential emergency and should be followed up as soon as possible.  One of the nurses will contact you 24 hours after your treatment. Please let the nurse know about any problems that you may have experienced. Feel free to call the clinic you have any questions or concerns. The clinic phone number is [phone].   I have been informed and understand all the instructions given to me. I know to contact the clinic, my physician, or go to the Emergency Department if any problems should occur. I do not have any questions at this time, but understand that I may call the clinic during office hours   should I have any questions or need assistance in obtaining follow up care.    __________________________________________  _____________  __________ Signature of Patient or Authorized Representative            Date                   Time    __________________________________________ Nurse's Signature    

## 2011-12-19 NOTE — Progress Notes (Signed)
1138 Patient refuses premedication of benedryl.

## 2012-01-16 ENCOUNTER — Ambulatory Visit (HOSPITAL_BASED_OUTPATIENT_CLINIC_OR_DEPARTMENT_OTHER): Payer: Medicare Other

## 2012-01-16 ENCOUNTER — Ambulatory Visit (HOSPITAL_BASED_OUTPATIENT_CLINIC_OR_DEPARTMENT_OTHER): Payer: Medicare Other | Admitting: Oncology

## 2012-01-16 ENCOUNTER — Telehealth: Payer: Self-pay | Admitting: *Deleted

## 2012-01-16 ENCOUNTER — Other Ambulatory Visit (HOSPITAL_BASED_OUTPATIENT_CLINIC_OR_DEPARTMENT_OTHER): Payer: Medicare Other | Admitting: Lab

## 2012-01-16 VITALS — BP 166/85 | HR 47 | Temp 97.5°F | Resp 20 | Ht 60.0 in | Wt 108.9 lb

## 2012-01-16 DIAGNOSIS — C50519 Malignant neoplasm of lower-outer quadrant of unspecified female breast: Secondary | ICD-10-CM

## 2012-01-16 DIAGNOSIS — C50919 Malignant neoplasm of unspecified site of unspecified female breast: Secondary | ICD-10-CM

## 2012-01-16 DIAGNOSIS — Z5112 Encounter for antineoplastic immunotherapy: Secondary | ICD-10-CM

## 2012-01-16 LAB — CBC WITH DIFFERENTIAL/PLATELET
Basophils Absolute: 0 10*3/uL (ref 0.0–0.1)
Eosinophils Absolute: 0.2 10*3/uL (ref 0.0–0.5)
HGB: 13.8 g/dL (ref 11.6–15.9)
LYMPH%: 22.7 % (ref 14.0–49.7)
MCV: 92.1 fL (ref 79.5–101.0)
MONO%: 12.3 % (ref 0.0–14.0)
NEUT#: 3.3 10*3/uL (ref 1.5–6.5)
Platelets: 145 10*3/uL (ref 145–400)
RDW: 14.2 % (ref 11.2–14.5)

## 2012-01-16 LAB — COMPREHENSIVE METABOLIC PANEL (CC13)
CO2: 26 mEq/L (ref 22–29)
Calcium: 10.7 mg/dL — ABNORMAL HIGH (ref 8.4–10.4)
Chloride: 106 mEq/L (ref 98–107)
Glucose: 88 mg/dl (ref 70–99)
Sodium: 143 mEq/L (ref 136–145)
Total Bilirubin: 0.7 mg/dL (ref 0.20–1.20)
Total Protein: 7.4 g/dL (ref 6.4–8.3)

## 2012-01-16 MED ORDER — TRASTUZUMAB CHEMO INJECTION 440 MG
6.0000 mg/kg | Freq: Once | INTRAVENOUS | Status: AC
  Administered 2012-01-16: 294 mg via INTRAVENOUS
  Filled 2012-01-16: qty 14

## 2012-01-16 MED ORDER — SODIUM CHLORIDE 0.9 % IV SOLN
Freq: Once | INTRAVENOUS | Status: AC
  Administered 2012-01-16: 20 mL via INTRAVENOUS

## 2012-01-16 MED ORDER — ACETAMINOPHEN 325 MG PO TABS
650.0000 mg | ORAL_TABLET | Freq: Once | ORAL | Status: AC
  Administered 2012-01-16: 650 mg via ORAL

## 2012-01-16 NOTE — Progress Notes (Signed)
ID: [name]   DOB: [birth year]  MR#: [id number]  WUJ#:[id number]  HISTORY OF PRESENT ILLNESS: [name]'s gynecologist, Dr. Conley Simmonds, palpated a mass in the patient's left breast in 2004. The patient has a daughter, who is a Engineer, civil (consulting), formerly I believe an oncology nurse, currently a pulmonary nurse in [place], so the patient went there for her care, and while I do not have all the workup, I do have the pathology report from October 01, 2002, which was her left lumpectomy and sentinel lymph node sampling under Dr. Rexene Edison. This showed [phone]) a 2.5 cm infiltrating ductal carcinoma with lobular features (the cells were E-cadherin positive), involving one of three sentinel lymph nodes. The anterior margin was close at less than 1 mm. The patient saw Dr. Chipper Herb here, and the option of completion mastectomy versus radiotherapy alone was discussed. He felt, and I would probably have agreed, that despite the very close margin, which was nevertheless technically negative, proceeding to radiation without "clearing the margin" further would be adequate. Accordingly, the patient did receive a total of 5040 cGy with an additional 1000 cGy boost to the left breast between September 11 and February 24, 2003.  The patient then did well, but in 2007 developed some redness and bumpiness around the left areola. She brought this to Dr. Wilfred Lacy attention, and apparently an initial biopsy was negative, as was a mammogram, however, with further development of the change, the patient had a biopsy under anesthesia, and this apparently was positive (I do not have those records). With this information, she proceeded to left mastectomy on February 26, 2006. 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 The closest margin being 1.2 cm from the inferior margin. Everything else being amply negative. There were no discrete masses or  lesions identified in the left breast. The patient subsequently underwent left mastectomy.  There were a series of further skin recurrences, first in November of 2008. Initially, these were resected, but later there were too many lesions to resect, in addition to a large left axillary lymph node. This was biopsied in January 2011, showing the tumor now to be HER-2/neu positive. Patient has been on anti-HER-2/neu treatments since January 2011, initially with trastuzumab plus lapatinib. Lapatinib was discontinued in April 2011 secondary to side effects. Continues now to receive trastuzumab on a q. 3 week basis. Also receives a zoledronic acid every 3 months.    INTERVAL HISTORY: The patient returns of with her daughter-in-law [name] for followup of her breast cancer. The interval history is significant for the patient thinking about moving from her to bedroom at independent apartment to a retirement center  REVIEW OF SYSTEMS: She complains about losing her hearing and her visionas good as it was. She sleeps poorly, describes herself as mildly fatigued, and has some urinary leakage. She has some joint issues here and there. Overall however a detailed review of systems is negative in her functional status remains excellent for her age.  PAST MEDICAL HISTORY: Past Medical History  Diagnosis Date  . Cancer     breast  . Cataract   . CAD (coronary artery disease)     cath w/ trivial disease  . Diverticulosis of colon   . Osteoporosis     s/p compression fx T12  . Macular degeneration     PAST SURGICAL HISTORY: Past Surgical History  Procedure Date  . Abdominal hysterectomy   . Breast lumpectomy   . Excision  for paget's vulvar   . Vertbroplasty     T12    FAMILY HISTORY The patient's father died in an automobile accident at 31, the patient's mother from a stroke at 74. The patient had one brother who died from causes unknown to her at the age of 30, and a sister who had "lots of problems"  and died at the age of 43, but there is no history of breast or ovarian cancer in the family.   GYNECOLOGIC HISTORY: She is GX P3. She took hormone replacement therapy briefly after menopause.   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 She has been a widow since 1997; lives independently, drives, and does all of her activities of daily living. Her daughter, [name], is a Engineer, civil (consulting) in [place]. Her daughter, [name], lives in [place].[name], who frequently comes with the patient to medical visits, is married to the patient's son, who is also her power of attorney. He works for the [employer]. The patient has six grandchildren and three great-grandchildren. She attends [employer].     ADVANCED DIRECTIVES: in place  HEALTH MAINTENANCE: History  Substance Use Topics  . Smoking status: Former Smoker    Quit date: 05/01/1990  . Smokeless tobacco: Never Used  . Alcohol Use: No     Colonoscopy:  PAP:  Bone density:  Lipid panel:  Allergies  Allergen Reactions  . Solifenacin Succinate     REACTION: "sick" nausea    Current Outpatient Prescriptions  Medication Sig Dispense Refill  . acetaminophen (TYLENOL) 500 MG tablet Take 500 mg by mouth at bedtime.        . Ascorbic Acid (VITAMIN C) 100 MG tablet Take 100 mg by mouth daily.        . fish oil-omega-3 fatty acids 1000 MG capsule Take 2 g by mouth daily.        . furosemide (LASIX) 20 MG tablet Take 1 tablet (20 mg total) by mouth daily.  30 tablet  3  . metoprolol succinate (TOPROL-XL) 50 MG 24 hr tablet Take 1 tablet (50 mg total) by mouth daily.  90 tablet  3  . Multiple Vitamin (MULTIVITAMIN PO) Take by mouth.        . trastuzumab (HERCEPTIN) 440 MG injection Inject into the vein once.        . zolendronic acid (ZOMETA) 4 MG/5ML injection Inject 4 mg into the vein every 3 (three) months.          OBJECTIVE: Elderly white woman who  appears well Filed Vitals:   01/16/12 1055  BP: 166/85  Pulse: 47  Temp: 97.5 F (36.4 C)  Resp: 20     Body mass index is 21.27 kg/(m^2).    ECOG FS: 1  Sclerae unicteric Oropharynx clear No peripheral adenopathy Lungs no rales or rhonchi Heart regular rate and rhythm Abd benign, soft, nontender, positive bowel sounds MSK no focal spinal tenderness, no peripheral edema Skin is benign specifically including the anterior left chest wall Neuro: nonfocal Breasts: Right breast unremarkable, no masses, skin changes, or nipple inversion. Patient status post left mastectomy.. No evidence of local  recurrence   LAB RESULTS: Lab Results  Component Value Date   WBC 5.5 01/16/2012   NEUTROABS 3.3 01/16/2012   HGB 13.8 01/16/2012   HCT 43.0 01/16/2012   MCV 92.1 01/16/2012   PLT 145 01/16/2012      Chemistry  Component Value Date/Time   NA 142 12/19/2011 1047   K 4.0 12/19/2011 1047   CL 105 12/19/2011 1047   CO2 30 12/19/2011 1047   BUN 18 12/19/2011 1047   CREATININE 1.03 12/19/2011 1047      Component Value Date/Time   CALCIUM 10.3 12/19/2011 1047   ALKPHOS 46 12/19/2011 1047   AST 32 12/19/2011 1047   ALT 17 12/19/2011 1047   BILITOT 0.5 12/19/2011 1047       Lab Results  Component Value Date   LABCA2 17 09/26/2011     STUDIES: Dg Chest 2 View  10/17/2011  *RADIOLOGY REPORT*  Clinical Data: History of breast cancer.  Pleuritic right-sided chest pain.  CHEST - 2 VIEW  Comparison: 06/14/2011.  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  Left lower lobe discoid atelectasis is present.  There is a vague density in the right base on the frontal view which was not definitively present on the prior exam.  This may represent atelectasis however it is difficult to exclude a pulmonary nodule in this patient with history of malignancy.  Borderline heart size.  Emphysema is present.  There is no airspace disease or displaced rib fracture  identified.  No pneumothorax. Emphysema is present.  IMPRESSION:  1.  No displaced rib fractures or pneumothorax. 2.  Emphysema. 3.  Vague right basilar density is present, which may represent atelectasis.  Repeat PA and lateral chest in 8 weeks is recommended for reassessment.  Original Report Authenticated By: Andreas Newport, M.D.   ASSESSMENT: 78 y.o.  [place] woman  (1) status post left lumpectomy and sentinel lymph node dissection June of 2004 for a T2 N1 (Stage II) invasive ductal carcinoma, triple negative, status post radiation therapy completed October of 2004  (2) local recurrence around the areola October 2007, leading to mastectomy;  (3) then a series of further skin recurrences first noted November of 2008. Initially these were resected, later with too many lesions to resect; there was also a large left axillary lymph node which was biopsied January of 2011 showing the tumor now to be HER-2 positive.  (4) Since January 2011 she has been on anti-HER-2 treatment, initially with trastuzumab plus lapatinib, with a lapatinib discontinued April of 2011 because of side effects.  (5) continues to receive trastuzumab every 3 weeks and zoledronic acid every 3 months.   PLAN: She continues to do quite well. We discussed her possible move in great detail today. She is wise to do this very slowly, and is considering perhaps living in a retirement center for a week for a month before getting rid of her current apartment. As far as breast cancer goes a there is no evidence of active disease. She will be due for a repeat echocardiogram in October. She will continue to receive trastuzumab every 3 weeks and zoledronic acid every 3 months. She knows to call for any problems that may develop before the next visit.  Ariona Deschene C    01/16/2012

## 2012-01-16 NOTE — Telephone Encounter (Signed)
berry 12/10; schedule appt w Dr Ilda Mori at [facility]: "former pt of Dr Edward Jolly needs follow-up"  Called dr.richard kaplan office jennifer stated to fax over information to [phone] gave information to the medical records to have patient's records faxed over to the referring office

## 2012-02-06 ENCOUNTER — Ambulatory Visit (HOSPITAL_BASED_OUTPATIENT_CLINIC_OR_DEPARTMENT_OTHER): Payer: Medicare Other

## 2012-02-06 ENCOUNTER — Other Ambulatory Visit (HOSPITAL_BASED_OUTPATIENT_CLINIC_OR_DEPARTMENT_OTHER): Payer: Medicare Other | Admitting: Lab

## 2012-02-06 VITALS — BP 175/71 | HR 42 | Temp 97.0°F | Resp 20

## 2012-02-06 DIAGNOSIS — C50519 Malignant neoplasm of lower-outer quadrant of unspecified female breast: Secondary | ICD-10-CM

## 2012-02-06 DIAGNOSIS — C50919 Malignant neoplasm of unspecified site of unspecified female breast: Secondary | ICD-10-CM

## 2012-02-06 DIAGNOSIS — Z5112 Encounter for antineoplastic immunotherapy: Secondary | ICD-10-CM

## 2012-02-06 DIAGNOSIS — M810 Age-related osteoporosis without current pathological fracture: Secondary | ICD-10-CM

## 2012-02-06 LAB — CBC WITH DIFFERENTIAL/PLATELET
Eosinophils Absolute: 0.3 10*3/uL (ref 0.0–0.5)
MONO#: 0.6 10*3/uL (ref 0.1–0.9)
NEUT#: 2.9 10*3/uL (ref 1.5–6.5)
Platelets: 148 10*3/uL (ref 145–400)
RBC: 4.71 10*6/uL (ref 3.70–5.45)
RDW: 14.3 % (ref 11.2–14.5)
WBC: 5 10*3/uL (ref 3.9–10.3)
lymph#: 1.2 10*3/uL (ref 0.9–3.3)
nRBC: 0 % (ref 0–0)

## 2012-02-06 MED ORDER — HEPARIN SOD (PORK) LOCK FLUSH 100 UNIT/ML IV SOLN
500.0000 [IU] | Freq: Once | INTRAVENOUS | Status: DC | PRN
  Filled 2012-02-06: qty 5

## 2012-02-06 MED ORDER — SODIUM CHLORIDE 0.9 % IV SOLN
Freq: Once | INTRAVENOUS | Status: DC
Start: 2012-02-06 — End: 2012-02-06

## 2012-02-06 MED ORDER — ACETAMINOPHEN 325 MG PO TABS
650.0000 mg | ORAL_TABLET | Freq: Once | ORAL | Status: AC
  Administered 2012-02-06: 650 mg via ORAL

## 2012-02-06 MED ORDER — DIPHENHYDRAMINE HCL 25 MG PO CAPS: 25.0000 mg | ORAL_CAPSULE | Freq: Once | ORAL | Status: DC

## 2012-02-06 MED ORDER — TRASTUZUMAB CHEMO INJECTION 440 MG
6.0000 mg/kg | Freq: Once | INTRAVENOUS | Status: AC
  Administered 2012-02-06: 294 mg via INTRAVENOUS
  Filled 2012-02-06: qty 14

## 2012-02-06 MED ORDER — ZOLEDRONIC ACID 4 MG/5ML IV CONC
3.0000 mg | Freq: Once | INTRAVENOUS | Status: AC
  Administered 2012-02-06: 3 mg via INTRAVENOUS
  Filled 2012-02-06: qty 3.75

## 2012-02-06 NOTE — Patient Instructions (Addendum)
[facility] Discharge Instructions for Patients Receiving Chemotherapy  Today you received the following chemotherapy agents :  Herceptin  To help prevent nausea and vomiting after your treatment, we encourage you to take your nausea medication If you develop nausea and vomiting that is not controlled by your nausea medication, call the clinic. If it is after clinic hours your family physician or the after hours number for the clinic or go to the Emergency Department.   BELOW ARE SYMPTOMS THAT SHOULD BE REPORTED IMMEDIATELY:  *FEVER GREATER THAN 100.5 F  *CHILLS WITH OR WITHOUT FEVER  NAUSEA AND VOMITING THAT IS NOT CONTROLLED WITH YOUR NAUSEA MEDICATION  *UNUSUAL SHORTNESS OF BREATH  *UNUSUAL BRUISING OR BLEEDING  TENDERNESS IN MOUTH AND THROAT WITH OR WITHOUT PRESENCE OF ULCERS  *URINARY PROBLEMS  *BOWEL PROBLEMS  UNUSUAL RASH Items with * indicate a potential emergency and should be followed up as soon as possible.   Please let the nurse know about any problems that you may have experienced. Feel free to call the clinic you have any questions or concerns. The clinic phone number is [phone].   I have been informed and understand all the instructions given to me. I know to contact the clinic, my physician, or go to the Emergency Department if any problems should occur. I do not have any questions at this time, but understand that I may call the clinic during office hours   should I have any questions or need assistance in obtaining follow up care.    __________________________________________  _____________  __________ Signature of Patient or Authorized Representative            Date                   Time    __________________________________________ Nurse's Signature   Zoledronic Acid injection (Hypercalcemia, Oncology) What is this medicine? ZOLEDRONIC ACID (ZOE le dron ik AS id) lowers the amount of calcium loss from bone. It is used to treat  too much calcium in your blood from cancer. It is also used to prevent complications of cancer that has spread to the bone. This medicine may be used for other purposes; ask your health care provider or pharmacist if you have questions. What should I tell my health care provider before I take this medicine? They need to know if you have any of these conditions: -aspirin-sensitive asthma -dental disease -kidney disease -an unusual or allergic reaction to zoledronic acid, other medicines, foods, dyes, or preservatives -pregnant or trying to get pregnant -breast-feeding How should I use this medicine? This medicine is for infusion into a vein. It is given by a health care professional in a hospital or clinic setting. Talk to your pediatrician regarding the use of this medicine in children. Special care may be needed. Overdosage: If you think you have taken too much of this medicine contact a poison control center or emergency room at once. NOTE: This medicine is only for you. Do not share this medicine with others. What if I miss a dose? It is important not to miss your dose. Call your doctor or health care professional if you are unable to keep an appointment. What may interact with this medicine? -certain antibiotics given by injection -NSAIDs, medicines for pain and inflammation, like ibuprofen or naproxen -some diuretics like bumetanide, furosemide -teriparatide -thalidomide This list may not describe all possible interactions. Give your health care provider a list of all the medicines, herbs, non-prescription drugs, or  dietary supplements you use. Also tell them if you smoke, drink alcohol, or use illegal drugs. Some items may interact with your medicine. What should I watch for while using this medicine? Visit your doctor or health care professional for regular checkups. It may be some time before you see the benefit from this medicine. Do not stop taking your medicine unless your doctor  tells you to. Your doctor may order blood tests or other tests to see how you are doing. Women should inform their doctor if they wish to become pregnant or think they might be pregnant. There is a potential for serious side effects to an unborn child. Talk to your health care professional or pharmacist for more information. You should make sure that you get enough calcium and vitamin D while you are taking this medicine. Discuss the foods you eat and the vitamins you take with your health care professional. Some people who take this medicine have severe bone, joint, and/or muscle pain. This medicine may also increase your risk for a broken thigh bone. Tell your doctor right away if you have pain in your upper leg or groin. Tell your doctor if you have any pain that does not go away or that gets worse. What side effects may I notice from receiving this medicine? Side effects that you should report to your doctor or health care professional as soon as possible: -allergic reactions like skin rash, itching or hives, swelling of the face, lips, or tongue -anxiety, confusion, or depression -breathing problems -changes in vision -feeling faint or lightheaded, falls -jaw burning, cramping, pain -muscle cramps, stiffness, or weakness -trouble passing urine or change in the amount of urine Side effects that usually do not require medical attention (report to your doctor or health care professional if they continue or are bothersome): -bone, joint, or muscle pain -fever -hair loss -irritation at site where injected -loss of appetite -nausea, vomiting -stomach upset -tired This list may not describe all possible side effects. Call your doctor for medical advice about side effects. You may report side effects to FDA at 1-800-FDA-1088. Where should I keep my medicine? This drug is given in a hospital or clinic and will not be stored at home. NOTE: This sheet is a summary. It may not cover all possible  information. If you have questions about this medicine, talk to your doctor, pharmacist, or health care provider.  2012, Elsevier/Gold Standard. (10/13/2010 9:06:58 AM)

## 2012-02-27 ENCOUNTER — Ambulatory Visit (HOSPITAL_BASED_OUTPATIENT_CLINIC_OR_DEPARTMENT_OTHER): Payer: Medicare Other

## 2012-02-27 ENCOUNTER — Other Ambulatory Visit: Payer: Self-pay | Admitting: Oncology

## 2012-02-27 ENCOUNTER — Other Ambulatory Visit (HOSPITAL_BASED_OUTPATIENT_CLINIC_OR_DEPARTMENT_OTHER): Payer: Medicare Other | Admitting: Lab

## 2012-02-27 VITALS — BP 144/80 | HR 52 | Temp 97.6°F

## 2012-02-27 DIAGNOSIS — Z5112 Encounter for antineoplastic immunotherapy: Secondary | ICD-10-CM

## 2012-02-27 DIAGNOSIS — C50519 Malignant neoplasm of lower-outer quadrant of unspecified female breast: Secondary | ICD-10-CM

## 2012-02-27 DIAGNOSIS — C50919 Malignant neoplasm of unspecified site of unspecified female breast: Secondary | ICD-10-CM

## 2012-02-27 LAB — CBC WITH DIFFERENTIAL/PLATELET
EOS%: 4.8 % (ref 0.0–7.0)
Eosinophils Absolute: 0.3 10*3/uL (ref 0.0–0.5)
LYMPH%: 22.5 % (ref 14.0–49.7)
MCH: 29.4 pg (ref 25.1–34.0)
MCV: 91.2 fL (ref 79.5–101.0)
MONO%: 10.3 % (ref 0.0–14.0)
NEUT#: 3.5 10*3/uL (ref 1.5–6.5)
Platelets: 142 10*3/uL — ABNORMAL LOW (ref 145–400)
RBC: 4.53 10*6/uL (ref 3.70–5.45)
RDW: 14.6 % — ABNORMAL HIGH (ref 11.2–14.5)
nRBC: 0 % (ref 0–0)

## 2012-02-27 MED ORDER — HEPARIN SOD (PORK) LOCK FLUSH 100 UNIT/ML IV SOLN
500.0000 [IU] | Freq: Once | INTRAVENOUS | Status: DC | PRN
  Filled 2012-02-27: qty 5

## 2012-02-27 MED ORDER — ACETAMINOPHEN 325 MG PO TABS
650.0000 mg | ORAL_TABLET | Freq: Once | ORAL | Status: AC
  Administered 2012-02-27: 650 mg via ORAL

## 2012-02-27 MED ORDER — SODIUM CHLORIDE 0.9 % IJ SOLN
10.0000 mL | INTRAMUSCULAR | Status: DC | PRN
  Filled 2012-02-27: qty 10

## 2012-02-27 MED ORDER — TRASTUZUMAB CHEMO INJECTION 440 MG
6.0000 mg/kg | Freq: Once | INTRAVENOUS | Status: AC
  Administered 2012-02-27: 294 mg via INTRAVENOUS
  Filled 2012-02-27: qty 14

## 2012-02-27 MED ORDER — SODIUM CHLORIDE 0.9 % IV SOLN
Freq: Once | INTRAVENOUS | Status: AC
  Administered 2012-02-27: 12:00:00 via INTRAVENOUS

## 2012-02-27 NOTE — Patient Instructions (Addendum)
[facility] Discharge Instructions for Patients Receiving Chemotherapy  Today you received the following chemotherapy agents: herceptin  To help prevent nausea and vomiting after your treatment, we encourage you to take your nausea medication.  Take it as often as prescribed.     If you develop nausea and vomiting that is not controlled by your nausea medication, call the clinic. If it is after clinic hours your family physician or the after hours number for the clinic or go to the Emergency Department.   BELOW ARE SYMPTOMS THAT SHOULD BE REPORTED IMMEDIATELY:  *FEVER GREATER THAN 100.5 F  *CHILLS WITH OR WITHOUT FEVER  NAUSEA AND VOMITING THAT IS NOT CONTROLLED WITH YOUR NAUSEA MEDICATION  *UNUSUAL SHORTNESS OF BREATH  *UNUSUAL BRUISING OR BLEEDING  TENDERNESS IN MOUTH AND THROAT WITH OR WITHOUT PRESENCE OF ULCERS  *URINARY PROBLEMS  *BOWEL PROBLEMS  UNUSUAL RASH Items with * indicate a potential emergency and should be followed up as soon as possible.  Feel free to call the clinic you have any questions or concerns. The clinic phone number is [phone].   I have been informed and understand all the instructions given to me. I know to contact the clinic, my physician, or go to the Emergency Department if any problems should occur. I do not have any questions at this time, but understand that I may call the clinic during office hours   should I have any questions or need assistance in obtaining follow up care.    __________________________________________  _____________  __________ Signature of Patient or Authorized Representative            Date                   Time    __________________________________________ Nurse's Signature    

## 2012-02-28 ENCOUNTER — Other Ambulatory Visit: Payer: Self-pay | Admitting: *Deleted

## 2012-02-29 ENCOUNTER — Encounter: Payer: Self-pay | Admitting: Gynecology

## 2012-02-29 ENCOUNTER — Other Ambulatory Visit: Payer: Self-pay | Admitting: Physician Assistant

## 2012-02-29 ENCOUNTER — Ambulatory Visit: Payer: Medicare Other | Attending: Gynecology | Admitting: Gynecology

## 2012-02-29 VITALS — BP 150/68 | HR 66 | Temp 97.9°F | Resp 18 | Ht 61.46 in | Wt 111.2 lb

## 2012-02-29 DIAGNOSIS — C4499 Other specified malignant neoplasm of skin, unspecified: Secondary | ICD-10-CM

## 2012-02-29 DIAGNOSIS — I2510 Atherosclerotic heart disease of native coronary artery without angina pectoris: Secondary | ICD-10-CM

## 2012-02-29 DIAGNOSIS — K5730 Diverticulosis of large intestine without perforation or abscess without bleeding: Secondary | ICD-10-CM | POA: Insufficient documentation

## 2012-02-29 DIAGNOSIS — C50919 Malignant neoplasm of unspecified site of unspecified female breast: Secondary | ICD-10-CM

## 2012-02-29 DIAGNOSIS — Z853 Personal history of malignant neoplasm of breast: Secondary | ICD-10-CM | POA: Insufficient documentation

## 2012-02-29 DIAGNOSIS — Z809 Family history of malignant neoplasm, unspecified: Secondary | ICD-10-CM | POA: Insufficient documentation

## 2012-02-29 DIAGNOSIS — Z87891 Personal history of nicotine dependence: Secondary | ICD-10-CM | POA: Insufficient documentation

## 2012-02-29 DIAGNOSIS — I10 Essential (primary) hypertension: Secondary | ICD-10-CM

## 2012-02-29 DIAGNOSIS — Z9010 Acquired absence of unspecified breast and nipple: Secondary | ICD-10-CM | POA: Insufficient documentation

## 2012-02-29 DIAGNOSIS — C519 Malignant neoplasm of vulva, unspecified: Secondary | ICD-10-CM | POA: Insufficient documentation

## 2012-02-29 DIAGNOSIS — Z90710 Acquired absence of both cervix and uterus: Secondary | ICD-10-CM | POA: Insufficient documentation

## 2012-02-29 NOTE — Progress Notes (Signed)
Consult Note: Gyn-Onc   [name] 78 y.o. female  Chief Complaint  Patient presents with  . Recurrent Paget's    New patient       HPI: 78 year old white female seen in consultation request of Dr. Huel Cote regarding management of recurrent vulvar Paget's disease. The patient's history dates back to at least 2004 where she had symptomatic vulvar Paget's disease. She's had 2 surgical resections of lesions.  Recently she has noted more irritation of the left vulva. She saw Dr. Senaida Ores on October 21 who obtained a biopsy of the left vulvar lesion showing recurrent Paget's disease. The patient's symptoms at the present time seem relatively mild other she describes this as "irritation". She denies any bleeding or any other vulvar pain.  Review of Systems:10 point review of systems is negative as noted above.   Vitals: Blood pressure 150/68, pulse 66, temperature 97.9 F (36.6 C), temperature source Axillary, resp. rate 18, height 5' 1.46" (1.561 m), weight 111 lb 3.2 oz (50.44 kg).  Physical Exam: General : The patient is a healthy woman in no acute distress.  HEENT: normocephalic, extraoccular movements normal; neck is supple without thyromegally  Lynphnodes: Supraclavicular and inguinal nodes not enlarged  Abdomen: Soft, non-tender, no ascites, no organomegally, no masses, no hernias  Pelvic:  EGBUS: Normal female on the left labia majora is a slightly raised erythematous lesion measuring approximately 4 cm in diameter. The absorbable sutures are removed at the patient's request. The wound from her biopsy seems to feel nicely. Vagina: Normal, no lesions , atrophic Urethra and Bladder: Normal, non-tender , atrophic Cervix: Surgically absent  Uterus: Surgically absent  Bi-manual examination: Non-tender; no adenxal masses or nodularity  Rectal: normal sphincter tone, no masses, no blood  Lower extremities: No edema or varicosities. Normal range of motion     Assessment/Plan: Recurrent Paget's disease of the left vulva. Management options discussed with the patient and her daughter. Primary therapy is recommended which would be a wide local excision of the obvious lesion where she is having symptoms. She is well aware that posture margins are quite common and that recurrent disease is common as she has experienced this on at least 3 occasions.  The patient is uncertain as to whether she wishes to proceed with surgery at this time. She will consider this and contact us should she wished to have surgery. In the interim she is given a prescription for clobetasol to be applied twice a day as needed for irritation.  Allergies  Allergen Reactions  . Solifenacin Succinate     REACTION: "sick" nausea    Past Medical History  Diagnosis Date  . Cancer     breast  . Cataract   . CAD (coronary artery disease)     cath w/ trivial disease  . Diverticulosis of colon   . Osteoporosis     s/p compression fx T12  . Macular degeneration   . Breast cancer     Past Surgical History  Procedure Date  . Abdominal hysterectomy   . Breast lumpectomy   . Excision for paget's vulvar   . Vertbroplasty     T12  . Mastectomy     left    Current Outpatient Prescriptions  Medication Sig Dispense Refill  . Ascorbic Acid (VITAMIN C) 100 MG tablet Take 100 mg by mouth daily.        . fish oil-omega-3 fatty acids 1000 MG capsule Take 2 g by mouth daily.        Marland Kitchen  furosemide (LASIX) 20 MG tablet Take 1 tablet (20 mg total) by mouth daily.  30 tablet  3  . metoprolol succinate (TOPROL-XL) 50 MG 24 hr tablet Take 1 tablet (50 mg total) by mouth daily.  90 tablet  3  . Multiple Vitamin (MULTIVITAMIN PO) Take by mouth.        . Multiple Vitamins-Minerals (ICAPS PO) Take by mouth daily.      . trastuzumab (HERCEPTIN) 440 MG injection Inject into the vein once.        . zolendronic acid (ZOMETA) 4 MG/5ML injection Inject 4 mg into the vein every 3 (three) months.         Marland Kitchen acetaminophen (TYLENOL) 500 MG tablet Take 500 mg by mouth at bedtime.          History   Social History  . Marital Status: Widowed    Spouse Name: N/A    Number of Children: N/A  . Years of Education: N/A   Occupational History  . Not on file.   Social History Main Topics  . Smoking status: Former Smoker    Quit date: 05/01/1990  . Smokeless tobacco: Never Used  . Alcohol Use: No  . Drug Use: No  . Sexually Active: No   Other Topics Concern  . Not on file   Social History Narrative   HS[employer], no college. Married '44- '97 widowed. 2 dtrs - '46, '54, 1 son - '49; 6 grandchildren; 5 great-grands. 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 She lives alone. End of life care: Do not resuscitate; willing to have intensive care excluding mechanical ventilation.     Family History  Problem Relation Age of Onset  . Cancer Sister       Jeannette Corpus, MD 02/29/2012, 3:30 PM

## 2012-02-29 NOTE — Patient Instructions (Signed)
Please used clobetasol twice a day as needed for itching. Contact us if she decides to go ahead with surgery. Otherwise we will follow and have her return appointment in 4 months.

## 2012-03-03 ENCOUNTER — Telehealth: Payer: Self-pay | Admitting: Oncology

## 2012-03-03 NOTE — Telephone Encounter (Signed)
S/w the pt and she is aware of her echo and appt with dr benshimon in nov.

## 2012-03-04 ENCOUNTER — Ambulatory Visit (INDEPENDENT_AMBULATORY_CARE_PROVIDER_SITE_OTHER): Payer: Medicare Other | Admitting: Internal Medicine

## 2012-03-04 ENCOUNTER — Encounter: Payer: Self-pay | Admitting: Internal Medicine

## 2012-03-04 VITALS — BP 162/92 | HR 55 | Temp 97.0°F | Resp 16 | Wt 109.0 lb

## 2012-03-04 DIAGNOSIS — Z23 Encounter for immunization: Secondary | ICD-10-CM

## 2012-03-04 DIAGNOSIS — M889 Osteitis deformans of unspecified bone: Secondary | ICD-10-CM

## 2012-03-04 DIAGNOSIS — I10 Essential (primary) hypertension: Secondary | ICD-10-CM

## 2012-03-04 MED ORDER — ENALAPRIL MALEATE 2.5 MG PO TABS: 2.5000 mg | ORAL_TABLET | Freq: Every day | ORAL | Status: DC

## 2012-03-04 NOTE — Patient Instructions (Addendum)
Blood pressure management: the blood pressure is variable and related to mental state: when upset, angry, anxious, etc the blood pressure will be higher. When relaxed, calm the blood pressure will be lower. With aging the speed of circulation remains the same but the blood vessels loose elasticity and this often leads to higher blood pressure readings.   Plan  Continue furosemide 20 mg once a day  Start enalapril 2.5 mg once a day - a very low dose and we may have to increase it.  After being on the enalapril for 2-3 days you can stop the metoprolol.  Having a blood pressure monitor, the kind that goes around the upper arm, will allow you to check BP at home and then relay information to me.  Side effects for enalapril are pretty minimal but - watch for swelling. Also, we will need to check lab work after you have been on this for 4 weeks.    Paget's disease - I have reviewed Dr. Huntley Dec note: it is an ok strategy to delay surgery and try the steroid cream. Paget's vulvar disease is slow moving and there is very little risk in delaying/avoiding surgery.

## 2012-03-07 NOTE — Progress Notes (Signed)
Subjective:    Patient ID: [name], female    DOB: Aug 15, 1913, 78 y.o.   MRN: [id number]  HPI [name] presents to discuss her blood pressure. She has had multiple episodes with significant excursions but has remained asymptomatic. She has her BP checked by congregational nursing once a week. She is asking if it makes sense to have a home monitor.  She has been diagnosed with recurrent vulvar Paget's disease and has seen Dr. Stanford Breed on consultation. Surgery was discussed but at this point, age 78, she is reluctant to consider surgery. She has been started on topical steroid treatment.   Past Medical History  Diagnosis Date  . Cancer     breast  . Cataract   . CAD (coronary artery disease)     cath w/ trivial disease  . Diverticulosis of colon   . Osteoporosis     s/p compression fx T12  . Macular degeneration   . Breast cancer    Past Surgical History  Procedure Date  . Abdominal hysterectomy   . Breast lumpectomy   . Excision for paget's vulvar   . Vertbroplasty     T12  . Mastectomy     left   Family History  Problem Relation Age of Onset  . Cancer Sister    History   Social History  . Marital Status: Widowed    Spouse Name: N/A    Number of Children: N/A  . Years of Education: N/A   Occupational History  . Not on file.   Social History Main Topics  . Smoking status: Former Smoker    Quit date: 05/01/1990  . Smokeless tobacco: Never Used  . Alcohol Use: No  . Drug Use: No  . Sexually Active: No   Other Topics Concern  . Not on file   Social History Narrative   HSG, no college. Married '44- '97 widowed. 2 dtrs - '46, '54, 1 son - '49; 6 grandchildren; 5 great-grands. 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 She lives alone. End of life care: Do not resuscitate; willing to have intensive care excluding mechanical ventilation.     Current Outpatient Prescriptions on File Prior to Visit  Medication Sig Dispense  Refill  . acetaminophen (TYLENOL) 500 MG tablet Take 500 mg by mouth at bedtime.        . Ascorbic Acid (VITAMIN C) 100 MG tablet Take 100 mg by mouth daily.        . fish oil-omega-3 fatty acids 1000 MG capsule Take 2 g by mouth daily.        . furosemide (LASIX) 20 MG tablet Take 1 tablet (20 mg total) by mouth daily.  30 tablet  3  . metoprolol succinate (TOPROL-XL) 50 MG 24 hr tablet Take 1 tablet (50 mg total) by mouth daily.  90 tablet  3  . Multiple Vitamin (MULTIVITAMIN PO) Take by mouth.        . Multiple Vitamins-Minerals (ICAPS PO) Take by mouth daily.      . trastuzumab (HERCEPTIN) 440 MG injection Inject into the vein once.        . zolendronic acid (ZOMETA) 4 MG/5ML injection Inject 4 mg into the vein every 3 (three) months.        . enalapril (VASOTEC) 2.5 MG tablet Take 1 tablet (2.5 mg total) by mouth daily.  30 tablet  11      Review of Systems System review is negative for any  constitutional, cardiac, pulmonary, GI or neuro symptoms or complaints other than as described in the HPI.     Objective:   Physical Exam Filed Vitals:   03/04/12 1615  BP: 162/92  Pulse: 55  Temp: 97 F (36.1 C)  Resp: 16   gen'l- slender elderly woman who looks younger than her stated age, in no distress HEENT- C&S clear, PERRLA Cor - 2+ radial pulse RRR Pulm - normal respirations Neuro - A&O x 3, cognition is excellent.       Assessment & Plan:

## 2012-03-07 NOTE — Assessment & Plan Note (Signed)
Blood pressure management: the blood pressure is variable and related to mental state: when upset, angry, anxious, etc the blood pressure will be higher. When relaxed, calm the blood pressure will be lower. With aging the speed of circulation remains the same but the blood vessels loose elasticity and this often leads to higher blood pressure readings.   Plan  Continue furosemide 20 mg once a day  Start enalapril 2.5 mg once a day - a very low dose and we may have to increase it.  After being on the enalapril for 2-3 days you can stop the metoprolol.  Having a blood pressure monitor, the kind that goes around the upper arm, will allow you to check BP at home and then relay information to me.  Side effects for enalapril are pretty minimal but - watch for swelling. Also, we will need to check lab work after you have been on this for 4 weeks.

## 2012-03-07 NOTE — Assessment & Plan Note (Signed)
Let her share her views. Gave support for her plan to use topical steroids at this time and delay any surgical decision.

## 2012-03-19 ENCOUNTER — Other Ambulatory Visit (HOSPITAL_BASED_OUTPATIENT_CLINIC_OR_DEPARTMENT_OTHER): Payer: Medicare Other | Admitting: Lab

## 2012-03-19 ENCOUNTER — Ambulatory Visit (HOSPITAL_BASED_OUTPATIENT_CLINIC_OR_DEPARTMENT_OTHER): Payer: Medicare Other

## 2012-03-19 VITALS — BP 169/80 | HR 62 | Temp 97.3°F

## 2012-03-19 DIAGNOSIS — C50919 Malignant neoplasm of unspecified site of unspecified female breast: Secondary | ICD-10-CM

## 2012-03-19 DIAGNOSIS — Z5112 Encounter for antineoplastic immunotherapy: Secondary | ICD-10-CM

## 2012-03-19 DIAGNOSIS — C50519 Malignant neoplasm of lower-outer quadrant of unspecified female breast: Secondary | ICD-10-CM

## 2012-03-19 LAB — CBC WITH DIFFERENTIAL/PLATELET
BASO%: 0.6 % (ref 0.0–2.0)
Basophils Absolute: 0 10*3/uL (ref 0.0–0.1)
EOS%: 4.4 % (ref 0.0–7.0)
HCT: 40.7 % (ref 34.8–46.6)
HGB: 13.2 g/dL (ref 11.6–15.9)
MCH: 29.8 pg (ref 25.1–34.0)
MCHC: 32.4 g/dL (ref 31.5–36.0)
MONO#: 0.7 10*3/uL (ref 0.1–0.9)
RDW: 14.9 % — ABNORMAL HIGH (ref 11.2–14.5)
WBC: 4.8 10*3/uL (ref 3.9–10.3)
lymph#: 0.9 10*3/uL (ref 0.9–3.3)

## 2012-03-19 MED ORDER — TRASTUZUMAB CHEMO INJECTION 440 MG
6.0000 mg/kg | Freq: Once | INTRAVENOUS | Status: AC
  Administered 2012-03-19: 294 mg via INTRAVENOUS
  Filled 2012-03-19: qty 14

## 2012-03-19 MED ORDER — DIPHENHYDRAMINE HCL 25 MG PO CAPS: 25.0000 mg | ORAL_CAPSULE | Freq: Once | ORAL | Status: DC

## 2012-03-19 MED ORDER — SODIUM CHLORIDE 0.9 % IV SOLN
Freq: Once | INTRAVENOUS | Status: AC
  Administered 2012-03-19: 11:00:00 via INTRAVENOUS

## 2012-03-19 MED ORDER — ACETAMINOPHEN 325 MG PO TABS
650.0000 mg | ORAL_TABLET | Freq: Once | ORAL | Status: AC
  Administered 2012-03-19: 650 mg via ORAL

## 2012-03-19 NOTE — Patient Instructions (Signed)

## 2012-03-20 ENCOUNTER — Encounter (HOSPITAL_COMMUNITY): Payer: Self-pay

## 2012-03-20 ENCOUNTER — Ambulatory Visit (HOSPITAL_BASED_OUTPATIENT_CLINIC_OR_DEPARTMENT_OTHER)
Admission: RE | Admit: 2012-03-20 | Discharge: 2012-03-20 | Disposition: A | Discharge status: Home / Self Care | Payer: Medicare Other | Source: Ambulatory Visit | Attending: Internal Medicine | Admitting: Internal Medicine

## 2012-03-20 ENCOUNTER — Ambulatory Visit (HOSPITAL_COMMUNITY)
Admission: RE | Admit: 2012-03-20 | Discharge: 2012-03-20 | Disposition: A | Discharge status: Home / Self Care | Payer: Medicare Other | Source: Ambulatory Visit | Attending: Internal Medicine | Admitting: Internal Medicine

## 2012-03-20 VITALS — BP 134/82 | HR 62 | Wt 109.8 lb

## 2012-03-20 DIAGNOSIS — I10 Essential (primary) hypertension: Secondary | ICD-10-CM

## 2012-03-20 DIAGNOSIS — M810 Age-related osteoporosis without current pathological fracture: Secondary | ICD-10-CM | POA: Insufficient documentation

## 2012-03-20 DIAGNOSIS — C50919 Malignant neoplasm of unspecified site of unspecified female breast: Secondary | ICD-10-CM

## 2012-03-20 DIAGNOSIS — I2510 Atherosclerotic heart disease of native coronary artery without angina pectoris: Secondary | ICD-10-CM | POA: Insufficient documentation

## 2012-03-20 DIAGNOSIS — I059 Rheumatic mitral valve disease, unspecified: Secondary | ICD-10-CM

## 2012-03-20 DIAGNOSIS — K219 Gastro-esophageal reflux disease without esophagitis: Secondary | ICD-10-CM | POA: Insufficient documentation

## 2012-03-20 DIAGNOSIS — K5730 Diverticulosis of large intestine without perforation or abscess without bleeding: Secondary | ICD-10-CM | POA: Insufficient documentation

## 2012-03-20 DIAGNOSIS — I359 Nonrheumatic aortic valve disorder, unspecified: Secondary | ICD-10-CM | POA: Insufficient documentation

## 2012-03-20 DIAGNOSIS — I079 Rheumatic tricuspid valve disease, unspecified: Secondary | ICD-10-CM | POA: Insufficient documentation

## 2012-03-20 DIAGNOSIS — M889 Osteitis deformans of unspecified bone: Secondary | ICD-10-CM | POA: Insufficient documentation

## 2012-03-20 DIAGNOSIS — D649 Anemia, unspecified: Secondary | ICD-10-CM | POA: Insufficient documentation

## 2012-03-20 DIAGNOSIS — F411 Generalized anxiety disorder: Secondary | ICD-10-CM | POA: Insufficient documentation

## 2012-03-20 NOTE — Progress Notes (Signed)
  Echocardiogram 2D Echocardiogram has been performed.  [name] 03/20/2012, 11:47 AM

## 2012-03-20 NOTE — Patient Instructions (Addendum)
Follow up in 3 months with an ECHO and Dr Bensimhon 

## 2012-03-20 NOTE — Assessment & Plan Note (Addendum)
I reviewed echos personally. EF and Doppler parameters stable. No HF on exam. Continue Herceptin.   

## 2012-03-29 NOTE — Progress Notes (Signed)
Patient ID: [name], female   DOB: [birth year], 78 y.o.   MRN: [id number] HPI:  [name] is a delightful 78 y/o woman referred by Dr. Darnelle Catalan for cardiac f/u in setting of Herceptin therapy.  Had cath in 2000 with no significant CAD. Found to have breast CA in 2004. Initial lumpectomy in 2004. There was evidence of recurrence. And then underwent mastectomy in 2006 at [facility]. Then treated with XRT. Had recurrence in L axillary node in 2010. Apparently the initial tumor was negative for ER/PR/Her 2-neu receptors. However lymph nod HER 2-neu + though to be mutation.   Started on Tykerb and Herceptin. Treated with Tykerb x 6 months.  Continues on Herceptin q 3 weeks (3 years in at this time)  Echos  08/2010: EF 55-60% lat s' hard to see about 8 cm/s  03/2011: EF 55-60% lateral s' velocity 9.2 cm/s 07/2011: EF 55-60% lateral s' 9.3-9.5 cm/s (difficult window) 10/2011:  EF 55-60% lateral s' 8.9, 9.2 cm/s 03/20/12 EF 55-60% lateral S' 9.2  Here for routine follow up today with her son.  Feels good. Denies SOB/PND/Orthopnea. She continues on Herceptin every 3 weeks.       Past Medical History  Diagnosis Date  . Cancer     breast  . Cataract   . CAD (coronary artery disease)     cath w/ trivial disease  . Diverticulosis of colon   . Osteoporosis     s/p compression fx T12  . Macular degeneration   . Breast cancer    Current Outpatient Prescriptions on File Prior to Encounter  Medication Sig Dispense Refill  . acetaminophen (TYLENOL) 500 MG tablet Take 500 mg by mouth at bedtime.        . Ascorbic Acid (VITAMIN C) 100 MG tablet Take 100 mg by mouth daily.        . enalapril (VASOTEC) 2.5 MG tablet Take 1 tablet (2.5 mg total) by mouth daily.  30 tablet  11  . fish oil-omega-3 fatty acids 1000 MG capsule Take 2 g by mouth daily.        . furosemide (LASIX) 20 MG tablet Take 1 tablet (20 mg total) by mouth daily.  30 tablet  3  . metoprolol succinate (TOPROL-XL) 50 MG 24 hr tablet  Take 1 tablet (50 mg total) by mouth daily.  90 tablet  3  . Multiple Vitamin (MULTIVITAMIN PO) Take by mouth.        . Multiple Vitamins-Minerals (ICAPS PO) Take by mouth daily.      . trastuzumab (HERCEPTIN) 440 MG injection Inject into the vein once.        . zolendronic acid (ZOMETA) 4 MG/5ML injection Inject 4 mg into the vein every 3 (three) months.          Review of Systems: System review is negative for any constitutional, cardiac, pulmonary, GI or neuro symptoms or complaints other than as described in the HPI.     Objective:    Filed Vitals:   03/20/12 1150  BP: 134/82  Pulse: 62  Weight: 109 lb 12.8 oz (49.805 kg)  SpO2: 97%   Physical Exam: General: Elderly Well appearing. No resp difficulty Son present HEENT: normal Neck: supple. JVD flat. Carotids 2+ bilat; no bruits. No lymphadenopathy or thryomegaly appreciated. Cor: PMI nondisplaced. Regular rate & rhythm. Soft diastolic murmur at RSB Lungs: clear Abdomen: soft, nontender, nondistended. No hepatosplenomegaly. No bruits or masses. Good bowel sounds. Extremities: no cyanosis, clubbing, rash, edema Neuro:  alert & orientedx3, cranial nerves grossly intact. moves all 4 extremities w/o difficulty. Affect pleasant     Assessment & Plan:

## 2012-04-08 ENCOUNTER — Ambulatory Visit (HOSPITAL_BASED_OUTPATIENT_CLINIC_OR_DEPARTMENT_OTHER): Payer: Medicare Other | Admitting: Physician Assistant

## 2012-04-08 ENCOUNTER — Ambulatory Visit (HOSPITAL_BASED_OUTPATIENT_CLINIC_OR_DEPARTMENT_OTHER): Payer: Medicare Other

## 2012-04-08 ENCOUNTER — Other Ambulatory Visit (HOSPITAL_BASED_OUTPATIENT_CLINIC_OR_DEPARTMENT_OTHER): Payer: Medicare Other | Admitting: Lab

## 2012-04-08 ENCOUNTER — Telehealth: Payer: Self-pay | Admitting: Oncology

## 2012-04-08 ENCOUNTER — Telehealth: Payer: Self-pay | Admitting: *Deleted

## 2012-04-08 ENCOUNTER — Encounter: Payer: Self-pay | Admitting: Physician Assistant

## 2012-04-08 VITALS — BP 185/77 | HR 66 | Temp 97.3°F | Resp 20 | Ht 61.46 in | Wt 108.6 lb

## 2012-04-08 DIAGNOSIS — C50519 Malignant neoplasm of lower-outer quadrant of unspecified female breast: Secondary | ICD-10-CM

## 2012-04-08 DIAGNOSIS — C50919 Malignant neoplasm of unspecified site of unspecified female breast: Secondary | ICD-10-CM

## 2012-04-08 DIAGNOSIS — Z5112 Encounter for antineoplastic immunotherapy: Secondary | ICD-10-CM

## 2012-04-08 DIAGNOSIS — M810 Age-related osteoporosis without current pathological fracture: Secondary | ICD-10-CM

## 2012-04-08 DIAGNOSIS — M889 Osteitis deformans of unspecified bone: Secondary | ICD-10-CM

## 2012-04-08 LAB — CBC WITH DIFFERENTIAL/PLATELET
Eosinophils Absolute: 0.1 10*3/uL (ref 0.0–0.5)
MCV: 92.5 fL (ref 79.5–101.0)
MONO%: 12.5 % (ref 0.0–14.0)
NEUT#: 3.1 10*3/uL (ref 1.5–6.5)
RBC: 4.52 10*6/uL (ref 3.70–5.45)
RDW: 14.7 % — ABNORMAL HIGH (ref 11.2–14.5)
WBC: 4.6 10*3/uL (ref 3.9–10.3)
lymph#: 0.7 10*3/uL — ABNORMAL LOW (ref 0.9–3.3)
nRBC: 0 % (ref 0–0)

## 2012-04-08 MED ORDER — ACETAMINOPHEN 325 MG PO TABS
650.0000 mg | ORAL_TABLET | Freq: Once | ORAL | Status: AC
  Administered 2012-04-08: 650 mg via ORAL

## 2012-04-08 MED ORDER — TRASTUZUMAB CHEMO INJECTION 440 MG
6.0000 mg/kg | Freq: Once | INTRAVENOUS | Status: AC
  Administered 2012-04-08: 294 mg via INTRAVENOUS
  Filled 2012-04-08: qty 14

## 2012-04-08 MED ORDER — DIPHENHYDRAMINE HCL 25 MG PO CAPS: 25.0000 mg | ORAL_CAPSULE | Freq: Once | ORAL | Status: DC

## 2012-04-08 MED ORDER — SODIUM CHLORIDE 0.9 % IV SOLN
Freq: Once | INTRAVENOUS | Status: AC
  Administered 2012-04-08: 12:00:00 via INTRAVENOUS

## 2012-04-08 NOTE — Telephone Encounter (Signed)
S/w the pt and she is aware to pick up her schedule from michelle in the chemo room for jan and her feb 2014 appts. Pt has been scheduled an appt with dr Clarise Cruz in feb at the [facility] failure clinic at [facility].

## 2012-04-08 NOTE — Progress Notes (Signed)
ID: [name]   DOB: 1914/02/13  MR#: [id number]  WUJ#:[id number]  HISTORY OF PRESENT ILLNESS: Mrs. Eller's gynecologist, Dr. Conley Simmonds, palpated a mass in the patient's left breast in 2004. The patient has a daughter, who is a Engineer, civil (consulting), formerly I believe an oncology nurse, currently a pulmonary nurse in [place], so the patient went there for her care, and while I do not have all the workup, I do have the pathology report from October 01, 2002, which was her left lumpectomy and sentinel lymph node sampling under Dr. Rexene Edison. This showed [id number]) a 2.5 cm infiltrating ductal carcinoma with lobular features (the cells were E-cadherin positive), involving one of three sentinel lymph nodes. The anterior margin was close at less than 1 mm. The patient saw Dr. Chipper Herb here, and the option of completion mastectomy versus radiotherapy alone was discussed. He felt, and I would probably have agreed, that despite the very close margin, which was nevertheless technically negative, proceeding to radiation without "clearing the margin" further would be adequate. Accordingly, the patient did receive a total of 5040 cGy with an additional 1000 cGy boost to the left breast between September 11 and February 24, 2003.  The patient then did well, but in 2007 developed some redness and bumpiness around the left areola. She brought this to Dr. Wilfred Lacy attention, and apparently an initial biopsy was negative, as was a mammogram, however, with further development of the change, the patient had a biopsy under anesthesia, and this apparently was positive (I do not have those records). With this information, she proceeded to left mastectomy on February 26, 2006. 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 The closest margin being 1.2 cm from the inferior margin. Everything else being amply negative. There were no discrete masses or  lesions identified in the left breast. The patient subsequently underwent left mastectomy.  There were a series of further skin recurrences, first in November of 2008. Initially, these were resected, but later there were too many lesions to resect, in addition to a large left axillary lymph node. This was biopsied in January 2011, showing the tumor now to be HER-2/neu positive. Patient has been on anti-HER-2/neu treatments since January 2011, initially with trastuzumab plus lapatinib. Lapatinib was discontinued in April 2011 secondary to side effects. Continues now to receive trastuzumab on a q. 3 week basis. Also receives a zoledronic acid every 3 months.    INTERVAL HISTORY: The patient returns of with her daughter-in-law [name] for followup of her breast cancer. The interval history is significant for patient having been diagnosed with recurrent Paget's disease affecting the left vulva. This has been evaluated by Dr. Huel Cote as well as Dr. Emmaline Kluver. At this time, Mrs. Roderic Scarce declines surgical intervention, and is utilizing topical clobetasol cream.   Otherwise she continues to receive trastuzumab there are office every 3 weeks, and receive zoledronic acid every 3 months, last given on 02/06/2012. She is status post recent echocardiogram and followup with Dr. Gala Romney who recommended continuing trastuzumab therapy.  REVIEW OF SYSTEMS: Mrs. Strausser has had no fevers or chills. She has mild fatigue. She has some insomnia. Overall, however, her functional status remains excellent. She is eating and drinking well with no nausea or change in bowel habits. She's had no increased shortness of breath or chest pain. No abnormal headaches or dizziness. She has some joint pains "now and then" which she attributes to arthritis.  Otherwise a detailed  review of systems is noncontributory.   PAST MEDICAL HISTORY: Past Medical History  Diagnosis Date  . Cancer     breast  . Cataract   . CAD  (coronary artery disease)     cath w/ trivial disease  . Diverticulosis of colon   . Osteoporosis     s/p compression fx T12  . Macular degeneration   . Breast cancer     PAST SURGICAL HISTORY: Past Surgical History  Procedure Date  . Abdominal hysterectomy   . Breast lumpectomy   . Excision for paget's vulvar   . Vertbroplasty     T12  . Mastectomy     left    FAMILY HISTORY The patient's father died in an automobile accident at 58, the patient's mother from a stroke at 70. The patient had one brother who died from causes unknown to her at the age of 77, and a sister who had "lots of problems" and died at the age of 81, but there is no history of breast or ovarian cancer in the family.   GYNECOLOGIC HISTORY: She is GX P3. She took hormone replacement therapy briefly after menopause.   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 She has been a widow since 1997; lives independently, drives, and does all of her activities of daily living. Her daughter, [name], is a Engineer, civil (consulting) in [place]. Her daughter, [name], lives in [place].[name], who frequently comes with the patient to medical visits, is married to the patient's son, who is also her power of attorney. He works for the [employer]. The patient has six grandchildren and three great-grandchildren. She attends [employer].     ADVANCED DIRECTIVES: in place  HEALTH MAINTENANCE: History  Substance Use Topics  . Smoking status: Former Smoker    Quit date: 05/01/1990  . Smokeless tobacco: Never Used  . Alcohol Use: No     Colonoscopy:  PAP:  Bone density:  Lipid panel:  Allergies  Allergen Reactions  . Solifenacin Succinate     REACTION: "sick" nausea    Current Outpatient Prescriptions  Medication Sig Dispense Refill  . acetaminophen (TYLENOL) 500 MG tablet Take 500 mg by mouth at bedtime.        . Ascorbic Acid (VITAMIN  C) 100 MG tablet Take 100 mg by mouth daily.        . clobetasol ointment (TEMOVATE) 0.05 %       . enalapril (VASOTEC) 2.5 MG tablet Take 1 tablet (2.5 mg total) by mouth daily.  30 tablet  11  . fish oil-omega-3 fatty acids 1000 MG capsule Take 2 g by mouth daily.        . furosemide (LASIX) 20 MG tablet Take 1 tablet (20 mg total) by mouth daily.  30 tablet  3  . metoprolol succinate (TOPROL-XL) 50 MG 24 hr tablet Take 1 tablet (50 mg total) by mouth daily.  90 tablet  3  . Multiple Vitamin (MULTIVITAMIN PO) Take by mouth.        . Multiple Vitamins-Minerals (ICAPS PO) Take by mouth daily.      . trastuzumab (HERCEPTIN) 440 MG injection Inject into the vein once.        . zolendronic acid (ZOMETA) 4 MG/5ML injection Inject 4 mg into the vein every 3 (three) months.         No current facility-administered medications for this visit.  Facility-Administered Medications Ordered in Other Visits  Medication Dose Route Frequency Provider Last Rate Last Dose  . [COMPLETED] 0.9 %  sodium chloride infusion   Intravenous Once Lowella Dell, MD 20 mL/hr at 04/08/12 1204    . [COMPLETED] acetaminophen (TYLENOL) tablet 650 mg  650 mg Oral Once Lowella Dell, MD   650 mg at 04/08/12 1209  . diphenhydrAMINE (BENADRYL) capsule 25 mg  25 mg Oral Once Lowella Dell, MD      . [address] trastuzumab (HERCEPTIN) 294 mg in sodium chloride 0.9 % 250 mL chemo infusion  6 mg/kg (Treatment Plan Actual) Intravenous Once Lowella Dell, MD   294 mg at 04/08/12 1237    OBJECTIVE: Elderly white woman who appears well Filed Vitals:   04/08/12 1103  BP: 185/77  Pulse: 66  Temp: 97.3 F (36.3 C)  Resp: 20     Body mass index is 20.21 kg/(m^2).    ECOG FS: 1  Sclerae unicteric Oropharynx clear No peripheral adenopathy Lungs no rales or rhonchi Heart regular rate and rhythm Abdomen benign, soft, nontender, positive bowel sounds MSK no focal spinal tenderness, no peripheral edema Skin is  benign specifically the anterior left chest wall Neuro: nonfocal, alert and oriented x3. Breasts: Right breast unremarkable, no masses, skin changes, or nipple inversion. Patient status post left mastectomy. No evidence of local  recurrence   LAB RESULTS: Lab Results  Component Value Date   WBC 4.6 04/08/2012   NEUTROABS 3.1 04/08/2012   HGB 13.6 04/08/2012   HCT 41.8 04/08/2012   MCV 92.5 04/08/2012   PLT 170 04/08/2012      Chemistry      Component Value Date/Time   NA 143 01/16/2012 1035   NA 142 12/19/2011 1047   K 4.1 01/16/2012 1035   K 4.0 12/19/2011 1047   CL 106 01/16/2012 1035   CL 105 12/19/2011 1047   CO2 26 01/16/2012 1035   CO2 30 12/19/2011 1047   BUN 15.0 01/16/2012 1035   BUN 18 12/19/2011 1047   CREATININE 1.0 01/16/2012 1035   CREATININE 1.03 12/19/2011 1047      Component Value Date/Time   CALCIUM 10.7* 01/16/2012 1035   CALCIUM 10.3 12/19/2011 1047   ALKPHOS 54 01/16/2012 1035   ALKPHOS 46 12/19/2011 1047   AST 31 01/16/2012 1035   AST 32 12/19/2011 1047   ALT 16 01/16/2012 1035   ALT 17 12/19/2011 1047   BILITOT 0.70 01/16/2012 1035   BILITOT 0.5 12/19/2011 1047       Lab Results  Component Value Date   LABCA2 17 09/26/2011     STUDIES: No results found.   Right mammogram at [facility] in October 2013.    ASSESSMENT: 78 y.o.  [place] woman  (1) status post left lumpectomy and sentinel lymph node dissection June of 2004 for a T2 N1 (Stage II) invasive ductal carcinoma, triple negative, status post radiation therapy completed October of 2004  (2) local recurrence around the areola October 2007, leading to mastectomy;  (3) then a series of further skin recurrences first noted November of 2008. Initially these were resected, later with too many lesions to resect; there was also a large left axillary lymph node which was biopsied January of 2011 showing the tumor now to be HER-2 positive.  (4) Since January 2011 she has been on anti-HER-2 treatment, initially with  trastuzumab plus lapatinib, with a lapatinib discontinued April of 2011 because of side effects.  (5) continues to receive  trastuzumab every 3 weeks and zoledronic acid every 3 months.  (6) Recurrence of vulvar Paget's disease, originally diagnosed and resected in 2004 and recurred in October 2013.  Followed by Dr. Huel Cote and Dr. De Blanch.  PLAN: Mrs. Brander continues to do well, and will proceed to treatment today as scheduled for her next q. three-week dose of trastuzumab. We will continue with her current regimen, and she will receive zoledronic acid every 3 months, due again in January.  She'll return to see Dr. Darnelle Catalan in February, and is also due for her next echocardiogram and visit to the CHF clinic at that time. She will see Dr. Emmaline Kluver again in March for followup of her recurrent Paget's disease. She knows to call for any problems that may develop before the next visit.  Natashia Roseman    04/08/2012

## 2012-04-08 NOTE — Patient Instructions (Signed)
[facility] Discharge Instructions for Patients Receiving Chemotherapy  Today you received the following chemotherapy agents: herceptin  To help prevent nausea and vomiting after your treatment, we encourage you to take your nausea medication.  Take it as often as prescribed.     If you develop nausea and vomiting that is not controlled by your nausea medication, call the clinic. If it is after clinic hours your family physician or the after hours number for the clinic or go to the Emergency Department.   BELOW ARE SYMPTOMS THAT SHOULD BE REPORTED IMMEDIATELY:  *FEVER GREATER THAN 100.5 F  *CHILLS WITH OR WITHOUT FEVER  NAUSEA AND VOMITING THAT IS NOT CONTROLLED WITH YOUR NAUSEA MEDICATION  *UNUSUAL SHORTNESS OF BREATH  *UNUSUAL BRUISING OR BLEEDING  TENDERNESS IN MOUTH AND THROAT WITH OR WITHOUT PRESENCE OF ULCERS  *URINARY PROBLEMS  *BOWEL PROBLEMS  UNUSUAL RASH Items with * indicate a potential emergency and should be followed up as soon as possible.  Feel free to call the clinic you have any questions or concerns. The clinic phone number is [phone].   I have been informed and understand all the instructions given to me. I know to contact the clinic, my physician, or go to the Emergency Department if any problems should occur. I do not have any questions at this time, but understand that I may call the clinic during office hours   should I have any questions or need assistance in obtaining follow up care.    __________________________________________  _____________  __________ Signature of Patient or Authorized Representative            Date                   Time    __________________________________________ Nurse's Signature    

## 2012-04-08 NOTE — Telephone Encounter (Signed)
Per staff message and POF I have scheduled appt.  JMW  

## 2012-04-29 ENCOUNTER — Other Ambulatory Visit: Payer: Self-pay | Admitting: *Deleted

## 2012-04-29 DIAGNOSIS — C50919 Malignant neoplasm of unspecified site of unspecified female breast: Secondary | ICD-10-CM

## 2012-05-01 ENCOUNTER — Ambulatory Visit (HOSPITAL_BASED_OUTPATIENT_CLINIC_OR_DEPARTMENT_OTHER): Payer: Medicare Other

## 2012-05-01 ENCOUNTER — Other Ambulatory Visit (HOSPITAL_BASED_OUTPATIENT_CLINIC_OR_DEPARTMENT_OTHER): Payer: Medicare Other | Admitting: Lab

## 2012-05-01 VITALS — BP 119/77 | HR 69 | Resp 18

## 2012-05-01 DIAGNOSIS — C50519 Malignant neoplasm of lower-outer quadrant of unspecified female breast: Secondary | ICD-10-CM

## 2012-05-01 DIAGNOSIS — C50919 Malignant neoplasm of unspecified site of unspecified female breast: Secondary | ICD-10-CM

## 2012-05-01 DIAGNOSIS — M810 Age-related osteoporosis without current pathological fracture: Secondary | ICD-10-CM

## 2012-05-01 DIAGNOSIS — Z5112 Encounter for antineoplastic immunotherapy: Secondary | ICD-10-CM

## 2012-05-01 LAB — COMPREHENSIVE METABOLIC PANEL (CC13)
ALT: 22 U/L (ref 0–55)
AST: 35 U/L — ABNORMAL HIGH (ref 5–34)
Albumin: 4.4 g/dL (ref 3.5–5.0)
Alkaline Phosphatase: 65 U/L (ref 40–150)
BUN: 21 mg/dL (ref 7.0–26.0)
Potassium: 4.3 mEq/L (ref 3.5–5.1)
Sodium: 142 mEq/L (ref 136–145)
Total Protein: 7.6 g/dL (ref 6.4–8.3)

## 2012-05-01 LAB — CBC WITH DIFFERENTIAL/PLATELET
BASO%: 0.9 % (ref 0.0–2.0)
EOS%: 3.4 % (ref 0.0–7.0)
MCH: 30.2 pg (ref 25.1–34.0)
MCHC: 33.5 g/dL (ref 31.5–36.0)
RBC: 4.8 10*6/uL (ref 3.70–5.45)
RDW: 14.4 % (ref 11.2–14.5)
WBC: 5.3 10*3/uL (ref 3.9–10.3)
lymph#: 0.9 10*3/uL (ref 0.9–3.3)
nRBC: 0 % (ref 0–0)

## 2012-05-01 MED ORDER — TRASTUZUMAB CHEMO INJECTION 440 MG
6.0000 mg/kg | Freq: Once | INTRAVENOUS | Status: AC
  Administered 2012-05-01: 294 mg via INTRAVENOUS
  Filled 2012-05-01: qty 14

## 2012-05-01 MED ORDER — SODIUM CHLORIDE 0.9 % IV SOLN
Freq: Once | INTRAVENOUS | Status: AC
  Administered 2012-05-01: 12:00:00 via INTRAVENOUS

## 2012-05-01 MED ORDER — ZOLEDRONIC ACID 4 MG/5ML IV CONC
3.0000 mg | Freq: Once | INTRAVENOUS | Status: AC
  Administered 2012-05-01: 3 mg via INTRAVENOUS
  Filled 2012-05-01: qty 3.75

## 2012-05-01 MED ORDER — DIPHENHYDRAMINE HCL 25 MG PO CAPS: 25.0000 mg | ORAL_CAPSULE | Freq: Once | ORAL | Status: DC

## 2012-05-01 MED ORDER — ACETAMINOPHEN 325 MG PO TABS
650.0000 mg | ORAL_TABLET | Freq: Once | ORAL | Status: AC
  Administered 2012-05-01: 650 mg via ORAL

## 2012-05-01 NOTE — Patient Instructions (Addendum)
[facility] Discharge Instructions for Patients Receiving Chemotherapy  Today you received the following chemotherapy agents: Herceptin. You also received Zometa.   BELOW ARE SYMPTOMS THAT SHOULD BE REPORTED IMMEDIATELY:  *FEVER GREATER THAN 100.5 F  *CHILLS WITH OR WITHOUT FEVER  NAUSEA AND VOMITING THAT IS NOT CONTROLLED WITH YOUR NAUSEA MEDICATION  *UNUSUAL SHORTNESS OF BREATH  *UNUSUAL BRUISING OR BLEEDING  TENDERNESS IN MOUTH AND THROAT WITH OR WITHOUT PRESENCE OF ULCERS  *URINARY PROBLEMS  *BOWEL PROBLEMS  UNUSUAL RASH Items with * indicate a potential emergency and should be followed up as soon as possible.  Feel free to call the clinic you have any questions or concerns. The clinic phone number is [phone].   I have been informed and understand all the instructions given to me. I know to contact the clinic, my physician, or go to the Emergency Department if any problems should occur. I do not have any questions at this time, but understand that I may call the clinic during office hours   should I have any questions or need assistance in obtaining follow up care.

## 2012-05-02 ENCOUNTER — Other Ambulatory Visit: Payer: Self-pay | Admitting: Certified Registered Nurse Anesthetist

## 2012-05-05 ENCOUNTER — Other Ambulatory Visit: Payer: Self-pay | Admitting: Internal Medicine

## 2012-05-20 ENCOUNTER — Other Ambulatory Visit: Payer: Self-pay | Admitting: *Deleted

## 2012-05-20 DIAGNOSIS — C50919 Malignant neoplasm of unspecified site of unspecified female breast: Secondary | ICD-10-CM

## 2012-05-21 ENCOUNTER — Other Ambulatory Visit: Payer: Medicare Other | Admitting: Lab

## 2012-05-21 ENCOUNTER — Other Ambulatory Visit (HOSPITAL_BASED_OUTPATIENT_CLINIC_OR_DEPARTMENT_OTHER): Payer: Medicare Other | Admitting: Lab

## 2012-05-21 ENCOUNTER — Ambulatory Visit (HOSPITAL_BASED_OUTPATIENT_CLINIC_OR_DEPARTMENT_OTHER): Payer: Medicare Other

## 2012-05-21 VITALS — BP 166/87 | HR 74 | Temp 97.5°F | Resp 18

## 2012-05-21 DIAGNOSIS — C50919 Malignant neoplasm of unspecified site of unspecified female breast: Secondary | ICD-10-CM

## 2012-05-21 DIAGNOSIS — Z5112 Encounter for antineoplastic immunotherapy: Secondary | ICD-10-CM

## 2012-05-21 DIAGNOSIS — C50519 Malignant neoplasm of lower-outer quadrant of unspecified female breast: Secondary | ICD-10-CM

## 2012-05-21 LAB — CBC WITH DIFFERENTIAL/PLATELET
Basophils Absolute: 0 10*3/uL (ref 0.0–0.1)
EOS%: 5.1 % (ref 0.0–7.0)
Eosinophils Absolute: 0.2 10*3/uL (ref 0.0–0.5)
HCT: 42.4 % (ref 34.8–46.6)
HGB: 13.7 g/dL (ref 11.6–15.9)
MCH: 29.6 pg (ref 25.1–34.0)
NEUT#: 2.4 10*3/uL (ref 1.5–6.5)
NEUT%: 60.3 % (ref 38.4–76.8)
lymph#: 0.9 10*3/uL (ref 0.9–3.3)

## 2012-05-21 MED ORDER — ACETAMINOPHEN 325 MG PO TABS
650.0000 mg | ORAL_TABLET | Freq: Once | ORAL | Status: AC
  Administered 2012-05-21: 650 mg via ORAL

## 2012-05-21 MED ORDER — TRASTUZUMAB CHEMO INJECTION 440 MG
6.0000 mg/kg | Freq: Once | INTRAVENOUS | Status: AC
  Administered 2012-05-21: 294 mg via INTRAVENOUS
  Filled 2012-05-21: qty 14

## 2012-05-21 MED ORDER — SODIUM CHLORIDE 0.9 % IV SOLN
Freq: Once | INTRAVENOUS | Status: AC
  Administered 2012-05-21: 12:00:00 via INTRAVENOUS

## 2012-05-21 NOTE — Progress Notes (Signed)
bp 166/87 Ok to treat per Dr. Darnelle Catalan.

## 2012-05-21 NOTE — Patient Instructions (Addendum)
[facility] Discharge Instructions for Patients Receiving Chemotherapy  Today you received the following chemotherapy agents Herceptin.  To help prevent nausea and vomiting after your treatment, we encourage you to take your nausea medication as prescribed.   If you develop nausea and vomiting that is not controlled by your nausea medication, call the clinic. If it is after clinic hours your family physician or the after hours number for the clinic or go to the Emergency Department.   BELOW ARE SYMPTOMS THAT SHOULD BE REPORTED IMMEDIATELY:  *FEVER GREATER THAN 100.5 F  *CHILLS WITH OR WITHOUT FEVER  NAUSEA AND VOMITING THAT IS NOT CONTROLLED WITH YOUR NAUSEA MEDICATION  *UNUSUAL SHORTNESS OF BREATH  *UNUSUAL BRUISING OR BLEEDING  TENDERNESS IN MOUTH AND THROAT WITH OR WITHOUT PRESENCE OF ULCERS  *URINARY PROBLEMS  *BOWEL PROBLEMS  UNUSUAL RASH Items with * indicate a potential emergency and should be followed up as soon as possible.  Feel free to call the clinic you have any questions or concerns. The clinic phone number is [phone].   I have been informed and understand all the instructions given to me. I know to contact the clinic, my physician, or go to the Emergency Department if any problems should occur. I do not have any questions at this time, but understand that I may call the clinic during office hours   should I have any questions or need assistance in obtaining follow up care.    __________________________________________  _____________  __________ Signature of Patient or Authorized Representative            Date                   Time    __________________________________________ Nurse's Signature    

## 2012-06-05 ENCOUNTER — Ambulatory Visit (HOSPITAL_COMMUNITY)
Admission: RE | Admit: 2012-06-05 | Discharge: 2012-06-05 | Disposition: A | Discharge status: Home / Self Care | Payer: Medicare Other | Source: Ambulatory Visit | Attending: Internal Medicine | Admitting: Internal Medicine

## 2012-06-05 ENCOUNTER — Ambulatory Visit (HOSPITAL_BASED_OUTPATIENT_CLINIC_OR_DEPARTMENT_OTHER)
Admission: RE | Admit: 2012-06-05 | Discharge: 2012-06-05 | Disposition: A | Discharge status: Home / Self Care | Payer: Medicare Other | Source: Ambulatory Visit | Attending: Internal Medicine | Admitting: Internal Medicine

## 2012-06-05 ENCOUNTER — Encounter (HOSPITAL_COMMUNITY): Payer: Self-pay

## 2012-06-05 VITALS — BP 128/86 | HR 65 | Wt 108.1 lb

## 2012-06-05 DIAGNOSIS — C50919 Malignant neoplasm of unspecified site of unspecified female breast: Secondary | ICD-10-CM

## 2012-06-05 DIAGNOSIS — Z09 Encounter for follow-up examination after completed treatment for conditions other than malignant neoplasm: Secondary | ICD-10-CM | POA: Insufficient documentation

## 2012-06-05 DIAGNOSIS — I359 Nonrheumatic aortic valve disorder, unspecified: Secondary | ICD-10-CM

## 2012-06-05 DIAGNOSIS — I080 Rheumatic disorders of both mitral and aortic valves: Secondary | ICD-10-CM | POA: Insufficient documentation

## 2012-06-05 DIAGNOSIS — I079 Rheumatic tricuspid valve disease, unspecified: Secondary | ICD-10-CM | POA: Insufficient documentation

## 2012-06-05 NOTE — Progress Notes (Signed)
  Echocardiogram 2D Echocardiogram has been performed.  Prashant Glosser, [facility] 06/05/2012, 10:27 AM

## 2012-06-05 NOTE — Assessment & Plan Note (Addendum)
Despite her advanced age, continues to do remarkably well. I reviewed echos personally. EF and Doppler parameters stable. No clinical HF. Continue Herceptin.

## 2012-06-05 NOTE — Patient Instructions (Addendum)
Follow in 3 months with an ECHO and Dr Bensimhon. 

## 2012-06-07 NOTE — Progress Notes (Signed)
Patient ID: [name], female   DOB: 1913-08-31, 79 y.o.   MRN: [id number] Oncologist: Dr Darnelle Catalan HPI: [name] is a delightful 79 y/o woman with breast cancer, and cath in 2000 with no significant CAD, S/P mastectomy in 2006 at [facility]. Then treated with XRT. Had recurrence in L axillary node in 2010. Apparently the initial tumor was negative for ER/PR/Her 2-neu receptors. However lymph nod HER 2-neu + though to be mutation.   Started on Tykerb and Herceptin. Treated with Tykerb x 6 months.  Continues on Herceptin q 3 weeks (3 years in at this time)  Echos  08/2010: EF 55-60% lat s' hard to see about 8 cm/s  03/2011: EF 55-60% lateral s' velocity 9.2 cm/s 07/2011: EF 55-60% lateral s' 9.3-9.5 cm/s (difficult window) 10/2011:  EF 55-60% lateral s' 8.9, 9.2 cm/s 03/20/12 EF 55-60% lateral S' 9.2 06/05/12 EF 55-60%  Lateral S' 9.3  Here for routine follow up today.  Feels good. Denies SOB/PND/Orthopnea. She continues to perform all ADLS and house work. She continues on Herceptin every 3 weeks.       Past Medical History  Diagnosis Date  . Cancer     breast  . Cataract   . CAD (coronary artery disease)     cath w/ trivial disease  . Diverticulosis of colon   . Osteoporosis     s/p compression fx T12  . Macular degeneration   . Breast cancer    Current Outpatient Prescriptions on File Prior to Encounter  Medication Sig Dispense Refill  . acetaminophen (TYLENOL) 500 MG tablet Take 500 mg by mouth at bedtime.        . Ascorbic Acid (VITAMIN C) 100 MG tablet Take 100 mg by mouth daily.        . clobetasol ointment (TEMOVATE) 0.05 %       . enalapril (VASOTEC) 2.5 MG tablet Take 1 tablet (2.5 mg total) by mouth daily.  30 tablet  11  . fish oil-omega-3 fatty acids 1000 MG capsule Take 2 g by mouth daily.        . furosemide (LASIX) 20 MG tablet TAKE ONE TABLET BY MOUTH EVERY DAY  30 tablet  2  . metoprolol succinate (TOPROL-XL) 50 MG 24 hr tablet Take 1 tablet (50 mg total) by mouth  daily.  90 tablet  3  . Multiple Vitamin (MULTIVITAMIN PO) Take by mouth.        . Multiple Vitamins-Minerals (ICAPS PO) Take by mouth daily.      . trastuzumab (HERCEPTIN) 440 MG injection Inject into the vein once.        . zolendronic acid (ZOMETA) 4 MG/5ML injection Inject 4 mg into the vein every 3 (three) months.         No current facility-administered medications on file prior to encounter.    Review of Systems: System review is negative for any constitutional, cardiac, pulmonary, GI or neuro symptoms or complaints other than as described in the HPI.     Objective:    Filed Vitals:   06/05/12 1050  BP: 128/86  Pulse: 65  Weight: 108 lb 1.6 oz (49.034 kg)  SpO2: 96%   Physical Exam: General: Elderly Well appearing. No resp difficulty Son present HEENT: normal Neck: supple. JVD flat. Carotids 2+ bilat; no bruits. No lymphadenopathy or thryomegaly appreciated. Cor: PMI nondisplaced. Regular rate & rhythm. Soft diastolic murmur at RSB Lungs: clear Abdomen: soft, nontender, nondistended. No hepatosplenomegaly. No bruits or masses. Good bowel  sounds. Extremities: no cyanosis, clubbing, rash, edema Neuro: alert & orientedx3, cranial nerves grossly intact. moves all 4 extremities w/o difficulty. Affect pleasant     Assessment & Plan:

## 2012-06-10 ENCOUNTER — Other Ambulatory Visit: Payer: Self-pay | Admitting: *Deleted

## 2012-06-10 DIAGNOSIS — C50919 Malignant neoplasm of unspecified site of unspecified female breast: Secondary | ICD-10-CM

## 2012-06-11 ENCOUNTER — Ambulatory Visit (HOSPITAL_BASED_OUTPATIENT_CLINIC_OR_DEPARTMENT_OTHER): Payer: Medicare Other

## 2012-06-11 ENCOUNTER — Other Ambulatory Visit (HOSPITAL_BASED_OUTPATIENT_CLINIC_OR_DEPARTMENT_OTHER): Payer: Medicare Other | Admitting: Lab

## 2012-06-11 ENCOUNTER — Telehealth: Payer: Self-pay | Admitting: Oncology

## 2012-06-11 ENCOUNTER — Ambulatory Visit (HOSPITAL_BASED_OUTPATIENT_CLINIC_OR_DEPARTMENT_OTHER): Payer: Medicare Other | Admitting: Oncology

## 2012-06-11 VITALS — BP 167/82 | HR 78 | Temp 97.8°F | Resp 20 | Ht 61.46 in | Wt 110.9 lb

## 2012-06-11 DIAGNOSIS — C50919 Malignant neoplasm of unspecified site of unspecified female breast: Secondary | ICD-10-CM

## 2012-06-11 DIAGNOSIS — Z853 Personal history of malignant neoplasm of breast: Secondary | ICD-10-CM

## 2012-06-11 DIAGNOSIS — C44599 Other specified malignant neoplasm of skin of other part of trunk: Secondary | ICD-10-CM

## 2012-06-11 DIAGNOSIS — C773 Secondary and unspecified malignant neoplasm of axilla and upper limb lymph nodes: Secondary | ICD-10-CM

## 2012-06-11 DIAGNOSIS — Z5112 Encounter for antineoplastic immunotherapy: Secondary | ICD-10-CM

## 2012-06-11 DIAGNOSIS — Z9010 Acquired absence of unspecified breast and nipple: Secondary | ICD-10-CM

## 2012-06-11 LAB — CBC WITH DIFFERENTIAL/PLATELET
Basophils Absolute: 0 10*3/uL (ref 0.0–0.1)
Eosinophils Absolute: 0.2 10*3/uL (ref 0.0–0.5)
HCT: 41.4 % (ref 34.8–46.6)
HGB: 13.5 g/dL (ref 11.6–15.9)
LYMPH%: 18.9 % (ref 14.0–49.7)
MCV: 92.2 fL (ref 79.5–101.0)
MONO#: 0.5 10*3/uL (ref 0.1–0.9)
MONO%: 10.8 % (ref 0.0–14.0)
NEUT#: 3 10*3/uL (ref 1.5–6.5)
NEUT%: 66.6 % (ref 38.4–76.8)
Platelets: 150 10*3/uL (ref 145–400)
WBC: 4.5 10*3/uL (ref 3.9–10.3)

## 2012-06-11 LAB — COMPREHENSIVE METABOLIC PANEL (CC13)
ALT: 21 U/L (ref 0–55)
AST: 35 U/L — ABNORMAL HIGH (ref 5–34)
Albumin: 4.1 g/dL (ref 3.5–5.0)
BUN: 18.1 mg/dL (ref 7.0–26.0)
Calcium: 10.3 mg/dL (ref 8.4–10.4)
Chloride: 108 mEq/L — ABNORMAL HIGH (ref 98–107)
Potassium: 3.7 mEq/L (ref 3.5–5.1)

## 2012-06-11 MED ORDER — DIPHENHYDRAMINE HCL 25 MG PO CAPS: 25.0000 mg | ORAL_CAPSULE | Freq: Once | ORAL | Status: DC

## 2012-06-11 MED ORDER — ACETAMINOPHEN 325 MG PO TABS
650.0000 mg | ORAL_TABLET | Freq: Once | ORAL | Status: AC
  Administered 2012-06-11: 650 mg via ORAL

## 2012-06-11 MED ORDER — SODIUM CHLORIDE 0.9 % IV SOLN
Freq: Once | INTRAVENOUS | Status: AC
  Administered 2012-06-11: 14:00:00 via INTRAVENOUS

## 2012-06-11 MED ORDER — TRASTUZUMAB CHEMO INJECTION 440 MG
6.0000 mg/kg | Freq: Once | INTRAVENOUS | Status: AC
  Administered 2012-06-11: 294 mg via INTRAVENOUS
  Filled 2012-06-11: qty 14

## 2012-06-11 NOTE — Patient Instructions (Addendum)
[facility] Discharge Instructions for Patients Receiving Chemotherapy  Today you received the following chemotherapy agents herceptin  To help prevent nausea and vomiting after your treatment, we encourage you to take your nausea medication as prescribed.If you develop nausea and vomiting that is not controlled by your nausea medication, call the clinic. If it is after clinic hours your family physician or the after hours number for the clinic or go to the Emergency Department.   BELOW ARE SYMPTOMS THAT SHOULD BE REPORTED IMMEDIATELY:  *FEVER GREATER THAN 100.5 F  *CHILLS WITH OR WITHOUT FEVER  NAUSEA AND VOMITING THAT IS NOT CONTROLLED WITH YOUR NAUSEA MEDICATION  *UNUSUAL SHORTNESS OF BREATH  *UNUSUAL BRUISING OR BLEEDING  TENDERNESS IN MOUTH AND THROAT WITH OR WITHOUT PRESENCE OF ULCERS  *URINARY PROBLEMS  *BOWEL PROBLEMS  UNUSUAL RASH Items with * indicate a potential emergency and should be followed up as soon as possible.  One of the nurses will contact you 24 hours after your treatment. Please let the nurse know about any problems that you may have experienced. Feel free to call the clinic you have any questions or concerns. The clinic phone number is [phone].   I have been informed and understand all the instructions given to me. I know to contact the clinic, my physician, or go to the Emergency Department if any problems should occur. I do not have any questions at this time, but understand that I may call the clinic during office hours   should I have any questions or need assistance in obtaining follow up care.    __________________________________________  _____________  __________ Signature of Patient or Authorized Representative            Date                   Time    __________________________________________ Nurse's Signature

## 2012-06-11 NOTE — Progress Notes (Signed)
ID: [name]   DOB: Jun 16, 1913  MR#: [id number]  HYQ#:[id number]  HISTORY OF PRESENT ILLNESS: [name]'s gynecologist, Dr. Conley [name], palpated a mass in the patient's left breast in 2004. The patient has a daughter, who is a Engineer, civil (consulting), formerly I believe an oncology nurse, currently a pulmonary nurse in [place], so the patient went there for her care, and while I do not have all the workup, I do have the pathology report from October 01, 2002, which was her left lumpectomy and sentinel lymph node sampling under Dr. Rexene [name]. This showed [phone]) a 2.5 cm infiltrating ductal carcinoma with lobular features (the cells were E-cadherin positive), involving one of three sentinel lymph nodes. The anterior margin was close at less than 1 mm. The patient saw Dr. Chipper [name] here, and the option of completion mastectomy versus radiotherapy alone was discussed. He felt, and I would probably have agreed, that despite the very close margin, which was nevertheless technically negative, proceeding to radiation without "clearing the margin" further would be adequate. Accordingly, the patient did receive a total of 5040 cGy with an additional 1000 cGy boost to the left breast between September 11 and February 24, 2003.  The patient then did well, but in 2007 developed some redness and bumpiness around the left areola. She brought this to Dr. Wilfred [name] attention, and apparently an initial biopsy was negative, as was a mammogram, however, with further development of the change, the patient had a biopsy under anesthesia, and this apparently was positive (I do not have those records). With this information, she proceeded to left mastectomy on February 26, 2006. The pathology there ([id number]) showed dermal involvement by adenocarcinoma, with pagetoid spread and lymphatic space invasion. The closest margin being 1.2 cm from the inferior margin. Everything else being amply negative. There were no discrete masses or  lesions identified in the left breast. The patient subsequently underwent left mastectomy.  There were a series of further skin recurrences, first in November of 2008. Initially, these were resected, but later there were too many lesions to resect, in addition to a large left axillary lymph node. This was biopsied in January 2011, showing the tumor now to be HER-2/neu positive. Patient has been on anti-HER-2/neu treatments since January 2011, initially with trastuzumab plus lapatinib. Lapatinib was discontinued in April 2011 secondary to side effects. Continues now to receive trastuzumab on a q. 3 week basis. Also receives a zoledronic acid every 3 months.    INTERVAL HISTORY: The patient returns of with her daughter-in-law [name] for followup of her breast cancer. The interval history is unremarkable. She is having more problems from her macular degeneration and has given up contract bridge. She keeps thousand and goes to church. She walks for exercise  REVIEW OF SYSTEMS: She denies any unusual headaches, dizziness, gait imbalance, nausea, or vomiting. She has chronic joint pain, chiefly lower back but really "all the joints", this not being more intense or constant than before. A detailed review of systems today was otherwise unremarkable and she is tolerating her Herceptin with no side effects it she is aware of   PAST MEDICAL HISTORY: Past Medical History  Diagnosis Date  . Cancer     breast  . Cataract   . CAD (coronary artery disease)     cath w/ trivial disease  . Diverticulosis of colon   . Osteoporosis     s/p compression fx T12  . Macular degeneration   . Breast cancer     PAST SURGICAL  HISTORY: Past Surgical History  Procedure Laterality Date  . Abdominal hysterectomy    . Breast lumpectomy    . Excision for paget's vulvar    . Vertbroplasty      T12  . Mastectomy      left    FAMILY HISTORY The patient's father died in an automobile accident at 71, the patient's  mother from a stroke at 64. The patient had one brother who died from causes unknown to her at the age of 74, and a sister who had "lots of problems" and died at the age of 23, but there is no history of breast or ovarian cancer in the family.   GYNECOLOGIC HISTORY: She is GX P3. She took hormone replacement therapy briefly after menopause.   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 She has been a widow since 1997; lives independently, drives, and does all of her activities of daily living. Her daughter, [name], is a Engineer, civil (consulting) in [place]. Her daughter, [name], lives in [place].[name], who frequently comes with the patient to medical visits, is married to the patient's son, who is also her power of attorney. He works for the [employer]. The patient has six grandchildren and three great-grandchildren. She attends [employer].     ADVANCED DIRECTIVES: in place  HEALTH MAINTENANCE: History  Substance Use Topics  . Smoking status: Former Smoker    Quit date: 05/01/1990  . Smokeless tobacco: Never Used  . Alcohol Use: No     Colonoscopy:  PAP:  Bone density:  Lipid panel:  Allergies  Allergen Reactions  . Solifenacin Succinate     REACTION: "sick" nausea    Current Outpatient Prescriptions  Medication Sig Dispense Refill  . acetaminophen (TYLENOL) 500 MG tablet Take 500 mg by mouth at bedtime.        . Ascorbic Acid (VITAMIN [name]) 100 MG tablet Take 100 mg by mouth daily.        . clobetasol ointment (TEMOVATE) 0.05 %       . enalapril (VASOTEC) 2.5 MG tablet Take 1 tablet (2.5 mg total) by mouth daily.  30 tablet  11  . fish oil-omega-3 fatty acids 1000 MG capsule Take 2 g by mouth daily.        . furosemide (LASIX) 20 MG tablet TAKE ONE TABLET BY MOUTH EVERY DAY  30 tablet  2  . metoprolol succinate (TOPROL-XL) 50 MG 24 hr tablet Take 1 tablet (50 mg total) by mouth daily.  90 tablet  3   . Multiple Vitamin (MULTIVITAMIN PO) Take by mouth.        . Multiple Vitamins-Minerals (ICAPS PO) Take by mouth daily.      . trastuzumab (HERCEPTIN) 440 MG injection Inject into the vein once.        . zolendronic acid (ZOMETA) 4 MG/5ML injection Inject 4 mg into the vein every 3 (three) months.         No current facility-administered medications for this visit.    OBJECTIVE: Elderly white woman in no acute distress Filed Vitals:   06/11/12 1332  BP: 167/82  Pulse: 78  Temp: 97.8 F (36.6 [name])  Resp: 20     Body mass index is 20.64 kg/(m^2).    ECOG FS: 1  Sclerae unicteric Oropharynx clear No cervical or supraclavicular adenopathy Lungs no rales or rhonchi Heart regular rate and rhythm Abdomen benign, soft, nontender,  positive bowel sounds MSK no focal spinal tenderness, no peripheral edema Skin is benign specifically the anterior left chest wall shows no evidence of disease recurrence Neuro: nonfocal, well oriented, positive affect Breasts: Right breast unremarkable, no masses, skin changes, or nipple inversion. Patient status post left mastectomy. No evidence of local  Recurrence. Benign left axilla  LAB RESULTS: Lab Results  Component Value Date   WBC 4.5 06/11/2012   NEUTROABS 3.0 06/11/2012   HGB 13.5 06/11/2012   HCT 41.4 06/11/2012   MCV 92.2 06/11/2012   PLT 150 06/11/2012      Chemistry      Component Value Date/Time   NA 142 05/01/2012 1113   NA 142 12/19/2011 1047   K 4.3 05/01/2012 1113   K 4.0 12/19/2011 1047   CL 104 05/01/2012 1113   CL 105 12/19/2011 1047   CO2 27 05/01/2012 1113   CO2 30 12/19/2011 1047   BUN 21.0 05/01/2012 1113   BUN 18 12/19/2011 1047   CREATININE 1.1 05/01/2012 1113   CREATININE 1.03 12/19/2011 1047      Component Value Date/Time   CALCIUM 10.4 05/01/2012 1113   CALCIUM 10.3 12/19/2011 1047   ALKPHOS 65 05/01/2012 1113   ALKPHOS 46 12/19/2011 1047   AST 35* 05/01/2012 1113   AST 32 12/19/2011 1047   ALT 22 05/01/2012 1113   ALT 17 12/19/2011 1047    BILITOT 0.61 05/01/2012 1113   BILITOT 0.5 12/19/2011 1047       Lab Results  Component Value Date   LABCA2 17 09/26/2011     STUDIES: Unremarkable Right mammogram at [facility] in October 2013. Echocardiogram 06/05/2012 shows an excellent ejection fraction    ASSESSMENT: 79 y.o.  [place] woman  (1) status post left lumpectomy and sentinel lymph node dissection June of 2004 for a T2 N1 (Stage II) invasive ductal carcinoma, triple negative, status post radiation therapy completed October of 2004  (2) local recurrence around the areola October 2007, leading to mastectomy;  (3) then a series of further skin recurrences first noted November of 2008. Initially these were resected, later with too many lesions to resect; there was also a large left axillary lymph node which was biopsied January of 2011 showing the tumor now to be HER-2 positive.  (4) Since January 2011 she has been on anti-HER-2 treatment, initially with trastuzumab plus lapatinib, with a lapatinib discontinued April of 2011 because of side effects.  (5) continues to receive trastuzumab every 3 weeks and zoledronic acid every 3 months. Most recent echo 06/05/2012 (6) Recurrence of vulvar Paget's disease, originally diagnosed and resected in 2004 and recurred in October 2013.  Followed by Dr. Huel Cote and Dr. De Blanch.  PLAN: Mrs. Angelucci is doing terrific as far as her breast cancer is concerned, and there is no evidence of disease activity at present. The plan is to continue every 3 week trastuzumab, and she will see [place] again in 3 months. She will have an echocardiogram before that visit. She knows to call for any problems that may develop before then.  [name]    06/11/2012

## 2012-06-11 NOTE — Telephone Encounter (Signed)
gv pt appt dtr in law appt schedule for March thru July.

## 2012-06-23 ENCOUNTER — Ambulatory Visit: Payer: Medicare Other | Admitting: Internal Medicine

## 2012-06-25 ENCOUNTER — Encounter (HOSPITAL_COMMUNITY): Payer: Self-pay

## 2012-06-25 ENCOUNTER — Encounter: Payer: Self-pay | Admitting: Internal Medicine

## 2012-06-25 ENCOUNTER — Ambulatory Visit (INDEPENDENT_AMBULATORY_CARE_PROVIDER_SITE_OTHER): Payer: Medicare Other | Admitting: Internal Medicine

## 2012-06-25 ENCOUNTER — Inpatient Hospital Stay (HOSPITAL_COMMUNITY)
Admission: AD | Admit: 2012-06-25 | Discharge: 2012-06-28 | DRG: 392 | Disposition: A | Discharge status: Home Health | Payer: Medicare Other | Source: Ambulatory Visit | Attending: Internal Medicine | Admitting: Internal Medicine

## 2012-06-25 VITALS — BP 112/68 | HR 53 | Resp 12 | Wt 109.0 lb

## 2012-06-25 DIAGNOSIS — K921 Melena: Secondary | ICD-10-CM

## 2012-06-25 DIAGNOSIS — C50919 Malignant neoplasm of unspecified site of unspecified female breast: Secondary | ICD-10-CM | POA: Diagnosis present

## 2012-06-25 DIAGNOSIS — Z8719 Personal history of other diseases of the digestive system: Secondary | ICD-10-CM

## 2012-06-25 DIAGNOSIS — Z79899 Other long term (current) drug therapy: Secondary | ICD-10-CM

## 2012-06-25 DIAGNOSIS — R143 Flatulence: Secondary | ICD-10-CM

## 2012-06-25 DIAGNOSIS — I2510 Atherosclerotic heart disease of native coronary artery without angina pectoris: Secondary | ICD-10-CM | POA: Diagnosis present

## 2012-06-25 DIAGNOSIS — R197 Diarrhea, unspecified: Secondary | ICD-10-CM

## 2012-06-25 DIAGNOSIS — K5730 Diverticulosis of large intestine without perforation or abscess without bleeding: Secondary | ICD-10-CM | POA: Diagnosis present

## 2012-06-25 DIAGNOSIS — A09 Infectious gastroenteritis and colitis, unspecified: Principal | ICD-10-CM | POA: Diagnosis present

## 2012-06-25 DIAGNOSIS — R1032 Left lower quadrant pain: Secondary | ICD-10-CM

## 2012-06-25 LAB — COMPREHENSIVE METABOLIC PANEL
Albumin: 3.5 g/dL (ref 3.5–5.2)
BUN: 9 mg/dL (ref 6–23)
Chloride: 105 mEq/L (ref 96–112)
Creatinine, Ser: 0.71 mg/dL (ref 0.50–1.10)
GFR calc Af Amer: 82 mL/min — ABNORMAL LOW (ref >90)
Total Bilirubin: 0.3 mg/dL (ref 0.3–1.2)
Total Protein: 6.3 g/dL (ref 6.0–8.3)

## 2012-06-25 LAB — CLOSTRIDIUM DIFFICILE BY PCR: Toxigenic C. Difficile by PCR: NEGATIVE

## 2012-06-25 LAB — ABO/RH: ABO/RH(D): B POS

## 2012-06-25 LAB — CBC
MCV: 91.1 fL (ref 78.0–100.0)
Platelets: 147 10*3/uL — ABNORMAL LOW (ref 150–400)
RBC: 4.49 MIL/uL (ref 3.87–5.11)
WBC: 2.7 10*3/uL — ABNORMAL LOW (ref 4.0–10.5)

## 2012-06-25 LAB — TYPE AND SCREEN
ABO/RH(D): B POS
Antibody Screen: NEGATIVE
Unit division: 0

## 2012-06-25 MED ORDER — ACETAMINOPHEN 650 MG RE SUPP: 650.0000 mg | Freq: Four times a day (QID) | RECTAL | Status: DC | PRN

## 2012-06-25 MED ORDER — ACETAMINOPHEN 500 MG PO TABS
500.0000 mg | ORAL_TABLET | Freq: Every day | ORAL | Status: DC
  Administered 2012-06-25 – 2012-06-27 (×3): 500 mg via ORAL
  Filled 2012-06-25 (×4): qty 1

## 2012-06-25 MED ORDER — ACETAMINOPHEN 325 MG PO TABS
650.0000 mg | ORAL_TABLET | Freq: Four times a day (QID) | ORAL | Status: DC | PRN
  Administered 2012-06-26: 650 mg via ORAL
  Filled 2012-06-25: qty 2

## 2012-06-25 MED ORDER — FUROSEMIDE 20 MG PO TABS: 20.0000 mg | ORAL_TABLET | Freq: Every day | ORAL | Status: DC

## 2012-06-25 MED ORDER — METOPROLOL SUCCINATE ER 50 MG PO TB24: 50.0000 mg | ORAL_TABLET | Freq: Every day | ORAL | Status: DC

## 2012-06-25 MED ORDER — PANTOPRAZOLE SODIUM 40 MG PO TBEC
40.0000 mg | DELAYED_RELEASE_TABLET | Freq: Two times a day (BID) | ORAL | Status: DC
  Administered 2012-06-25 – 2012-06-26 (×4): 40 mg via ORAL
  Filled 2012-06-25 (×5): qty 1

## 2012-06-25 MED ORDER — ENALAPRIL MALEATE 2.5 MG PO TABS
2.5000 mg | ORAL_TABLET | Freq: Every day | ORAL | Status: DC
  Administered 2012-06-26 – 2012-06-28 (×3): 2.5 mg via ORAL
  Filled 2012-06-25 (×3): qty 1

## 2012-06-25 MED ORDER — SODIUM CHLORIDE 0.45 % IV SOLN
50.0000 mL/h | INTRAVENOUS | Status: DC
  Administered 2012-06-25 – 2012-06-28 (×5): 50 mL/h via INTRAVENOUS

## 2012-06-25 NOTE — Progress Notes (Signed)
This nurse called infection control and ask if we can d/c enteric contact precaution, c-diff stool was negative, patient has no c/o nausea, denies pain. Order is to d/c contact precaution.Hulda Marin RN

## 2012-06-25 NOTE — Care Management (Signed)
CARE MANAGEMENT NOTE 06/25/2012  Patient:  [name], [name]   Account Number:  [id number]  Date Initiated:  06/25/2012  Documentation initiated by:  Montae Stager  Subjective/Objective Assessment:   79 yo femlae admitted with upper GI bleed.PTA pt lived home with family assisting in home care.     Action/Plan:   Home when stable   Anticipated DC Date:     Anticipated DC Plan:           Choice offered to / List presented to:  NA           Status of service:  In process, will continue to follow Medicare Important Message given?   (If response is "NO", the following Medicare IM given date fields will be blank) Date Medicare IM given:   Date Additional Medicare IM given:    Discharge Disposition:    Per UR Regulation:  Reviewed for med. necessity/level of care/duration of stay  If discussed at Long Length of Stay Meetings, dates discussed:    Comments:  06/25/12 Leonie Green [phone]

## 2012-06-25 NOTE — H&P (Signed)
[name] is an 79 y.o. female.   Chief Complaint: Black tarry stools and upper abdominal pain x 3 days HPI: [name], a delightful 79 y/o woman, reports that starting Sunday she began to have upper abdominal pain, lower abdominal cramping and frequent soft stools that were black and tarry and odiferous. She has not had any chest pain, shortness of breath but she has felt weak, getting worse every day. Due to melena and pain she is admitted with probably UGI bleed.   Past Medical History  Diagnosis Date  . Cancer     breast  . Cataract   . CAD (coronary artery disease)     cath w/ trivial disease  . Diverticulosis of colon   . Osteoporosis     s/p compression fx T12  . Macular degeneration   . Breast cancer     Past Surgical History  Procedure Laterality Date  . Abdominal hysterectomy    . Breast lumpectomy    . Excision for paget's vulvar    . Vertbroplasty      T12  . Mastectomy      left    Family History  Problem Relation Age of Onset  . Cancer Sister    Social History:  reports that she quit smoking about 22 years ago. She has never used smokeless tobacco. She reports that she does not drink alcohol or use illicit drugs. HSG, no college. Married '44- '97 widowed. 2 dtrs - '46, '54, 1 son - '49; 6 grandchildren; 5 great-grands. 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 She lives alone. End of life care: Do not resuscitate; willing to have intensive care excluding mechanical ventilation.    Allergies:  Allergies  Allergen Reactions  . Solifenacin Succinate     REACTION: "sick" nausea    No current facility-administered medications on file prior to encounter.   Current Outpatient Prescriptions on File Prior to Encounter  Medication Sig Dispense Refill  . acetaminophen (TYLENOL) 500 MG tablet Take 500 mg by mouth at bedtime.        . Ascorbic Acid (VITAMIN C) 100 MG tablet Take 100 mg by mouth daily.        . clobetasol ointment  (TEMOVATE) 0.05 %       . enalapril (VASOTEC) 2.5 MG tablet Take 1 tablet (2.5 mg total) by mouth daily.  30 tablet  11  . fish oil-omega-3 fatty acids 1000 MG capsule Take 2 g by mouth daily.        . furosemide (LASIX) 20 MG tablet TAKE ONE TABLET BY MOUTH EVERY DAY  30 tablet  2  . metoprolol succinate (TOPROL-XL) 50 MG 24 hr tablet Take 1 tablet (50 mg total) by mouth daily.  90 tablet  3  . Multiple Vitamin (MULTIVITAMIN PO) Take by mouth.        . Multiple Vitamins-Minerals (ICAPS PO) Take by mouth daily.      . trastuzumab (HERCEPTIN) 440 MG injection Inject into the vein once.        . zolendronic acid (ZOMETA) 4 MG/5ML injection Inject 4 mg into the vein every 3 (three) months.           No results found for this or any previous visit (from the past 48 hour(s)). No results found.  ROS   Physical Exam  No Temp   1121/68  HR 53 R 12 O2 sat 94% Gen'l- elderly white woman in no acute distress HEENT-  C&S clear, pupils s/p IOL, oropharynx w/o lesions Neck - supple, no thyromegaly Nodes - negative Chest mild kyphoscoliosis, no CVAT, absent left breast. Right breast not examined. Pulm - normal respirations Cor 2+ radial, RRR, w/o murmur Abd - BS x 4, no guarding or rebound, tender to deep palpation epigastrium Rectal deferrer (will heme test stools) Ext - no deformity Neuro - very bright and alert. Derm clear  Assessment/Plan 1 GI - elderly woman with several days of epigastric pain and melena very likely UGI bleede  Plan Med/surg admit  LAB - CBC, anemia panel, Cmet, type and screen  Clear liquid diet  Treatment to be based on labs  2. Oncology - she has had recurrent breast cancer x 3. She is currently stable.    [name] 06/25/2012, 9:44 AM

## 2012-06-25 NOTE — Progress Notes (Signed)
Patient admitted to rm 1312, alert and orientedx4,HOH,patient patient comfortably in bed.Will continue to monitor patient.- Hulda Marin RN

## 2012-06-26 ENCOUNTER — Telehealth: Payer: Self-pay | Admitting: *Deleted

## 2012-06-26 MED ORDER — ENSURE COMPLETE PO LIQD
237.0000 mL | Freq: Two times a day (BID) | ORAL | Status: DC
  Administered 2012-06-26 – 2012-06-28 (×3): 237 mL via ORAL

## 2012-06-26 NOTE — Telephone Encounter (Signed)
DIETICIAN, HEATHER ,  CALLED ON PATIENT AFTER VISITING WITH PATIENT THIS AM.. PATIENT STATES SHE IS TIRED AND HAS MUSCLE WEAKNESS. DIETICIAN IS REQUESTING A  PHYSICAL THERAPY CONSULT AND HOME HEALTH FOR THIS PATIENT WHEN SHE IS DISCHARGED FROM HOSPITAL  PLEASE ADVISE HEATHER ON THIS. CELL #[phone]

## 2012-06-26 NOTE — Care Management Note (Signed)
CM spoke with patient concerning discharge planning. MD order for HHRN/PT/HHA. Pt states not sure if she wants HH services at this time. CM provided choice list for [facility]. No choice made at this time. Cm explained to patient she can follow up with PCP for [facility] service set up if unable to make choice prior to discharge.   Roxy Manns Tamaiya Bump,RN,BSN [phone]

## 2012-06-26 NOTE — Progress Notes (Signed)
INITIAL NUTRITION ASSESSMENT  DOCUMENTATION CODES Per approved criteria  -Not Applicable   INTERVENTION: - Ensure Complete BID - Recommend PT evaluation, home health RN per pt request and concerns for significantly decreased muscle strength and weakness - left message with Dr. Alvera Novel RN - Will continue to monitor   NUTRITION DIAGNOSIS: Inadequate oral intake related to poor appetite as evidenced by <50% meal intake.   Goal: Pt to consume >75% of meals/supplements  Monitor:  Weights, labs, intake  Reason for Assessment: Nutrition risk   79 y.o. female  Admitting Dx: Black tarry stools and upper abdominal pain for 3 days  ASSESSMENT: Pt reports typically she eats well, 3 meals/day plus snacks and stable weight, however reports she has lost her appetite since Sunday. Pt lives alone and cooks for herself and states she has adequate amounts of food at home. Pt denies any problems chewing or swallowing. Pt not on any nutritional supplements at home. Pt denies any black tarry stools today but complains of feeing exhausted which is unusual for her. Pt states she normally is very active and drives but just feels considerably weaker in the past week. Pt interested in possible home health nurse once/week to help her out at home.   Height: Ht Readings from Last 1 Encounters:  06/25/12 5\' 1"  (1.549 m)    Weight: Wt Readings from Last 1 Encounters:  06/25/12 108 lb 3.9 oz (49.1 kg)    Ideal Body Weight: 105 lb  % Ideal Body Weight: 103  Wt Readings from Last 10 Encounters:  06/25/12 108 lb 3.9 oz (49.1 kg)  06/25/12 109 lb (49.442 kg)  06/11/12 110 lb 14.4 oz (50.304 kg)  06/05/12 108 lb 1.6 oz (49.034 kg)  04/08/12 108 lb 9.6 oz (49.261 kg)  03/20/12 109 lb 12.8 oz (49.805 kg)  03/04/12 109 lb (49.442 kg)  02/29/12 111 lb 3.2 oz (50.44 kg)  01/16/12 108 lb 14.4 oz (49.397 kg)  12/06/11 107 lb (48.535 kg)    Usual Body Weight: 108 lb  % Usual Body Weight: 100  BMI:   Body mass index is 20.46 kg/(m^2).  Estimated Nutritional Needs: Kcal: 1500-1700 Protein: 50-60g Fluid: 1.5-1.7L/day  Skin: Stage 1 sacral pressure ulcer  Diet Order: General  EDUCATION NEEDS: -No education needs identified at this time   Intake/Output Summary (Last 24 hours) at 06/26/12 1107 Last data filed at 06/26/12 0850  Gross per 24 hour  Intake   1674 ml  Output   1005 ml  Net    669 ml    Last BM: 2/26, diarrhea  Labs:   Recent Labs Lab 06/25/12 1127  NA 138  K 3.2*  CL 105  CO2 25  BUN 9  CREATININE 0.71  CALCIUM 8.0*  GLUCOSE 92    CBG (last 3)  No results found for this basename: GLUCAP,  in the last 72 hours  Scheduled Meds: . acetaminophen  500 mg Oral QHS  . enalapril  2.5 mg Oral Daily  . pantoprazole  40 mg Oral BID    Continuous Infusions: . sodium chloride 50 mL/hr (06/26/12 0724)    Past Medical History  Diagnosis Date  . Cancer     breast  . Cataract   . CAD (coronary artery disease)     cath w/ trivial disease  . Diverticulosis of colon   . Osteoporosis     s/p compression fx T12  . Macular degeneration   . Breast cancer     Past Surgical History  Procedure Laterality Date  . Abdominal hysterectomy    . Breast lumpectomy    . Excision for paget's vulvar    . Vertbroplasty      T12  . Mastectomy      left     Levon Hedger MS, RD, LDN [phone] Pager [phone] After Hours Pager

## 2012-06-26 NOTE — Telephone Encounter (Signed)
Please read phone note below and advise. 

## 2012-06-26 NOTE — Progress Notes (Signed)
AM note - no more diarrhea. She feels tired.  Plan advance diet  For D/C this PM if she can eat, if diarrhea remains gone.

## 2012-06-26 NOTE — Progress Notes (Signed)
Subjective: She had a pretty good day: able to eat, no BM, no abdominal pain  Objective: Lab: Lab Results  Component Value Date   WBC 2.7* 06/25/2012   HGB 11.7* 06/26/2012   HCT 36.0 06/26/2012   MCV 91.1 06/25/2012   PLT 147* 06/25/2012   BMET    Component Value Date/Time   NA 138 06/25/2012 1127   NA 142 06/11/2012 1322   K 3.2* 06/25/2012 1127   K 3.7 06/11/2012 1322   CL 105 06/25/2012 1127   CL 108* 06/11/2012 1322   CO2 25 06/25/2012 1127   CO2 28 06/11/2012 1322   GLUCOSE 92 06/25/2012 1127   GLUCOSE 93 06/11/2012 1322   BUN 9 06/25/2012 1127   BUN 18.1 06/11/2012 1322   CREATININE 0.71 06/25/2012 1127   CREATININE 0.9 06/11/2012 1322   CALCIUM 8.0* 06/25/2012 1127   CALCIUM 10.3 06/11/2012 1322   GFRNONAA 71* 06/25/2012 1127   GFRAA 82* 06/25/2012 1127     Imaging:  Scheduled Meds: . acetaminophen  500 mg Oral QHS  . enalapril  2.5 mg Oral Daily  . feeding supplement  237 mL Oral BID BM  . pantoprazole  40 mg Oral BID   Continuous Infusions: . sodium chloride 50 mL/hr (06/26/12 0724)   PRN Meds:.acetaminophen, acetaminophen   Physical Exam: Filed Vitals:   06/26/12 1343  BP: 146/50  Pulse: 62  Temp: 97.7 F (36.5 C)  Resp: 17   Gen'l spry nonogenarian Cor - RRR Pulm normal Abd - BS+, no guarding or rebound. Tender to deep palpation epigastrium      Assessment/Plan: 1. GI - with reports of melena, with response to PPI and with a drop in Hgb may have had UGI bleed. She does appear stable.  Plan Continue PPI  AM blood count.   Illene Regulus [place] [facility] (o) [phone]; (c) [phone] Call-grp - Pats[facility] Sears IM  Tele: [phone]  06/26/2012, 6:53 PM

## 2012-06-27 ENCOUNTER — Encounter (HOSPITAL_COMMUNITY): Payer: Self-pay | Admitting: *Deleted

## 2012-06-27 ENCOUNTER — Encounter (HOSPITAL_COMMUNITY)
Admission: AD | Disposition: A | Discharge status: Home Health | Payer: Self-pay | Source: Ambulatory Visit | Attending: Internal Medicine

## 2012-06-27 DIAGNOSIS — R197 Diarrhea, unspecified: Secondary | ICD-10-CM

## 2012-06-27 DIAGNOSIS — R1032 Left lower quadrant pain: Secondary | ICD-10-CM

## 2012-06-27 HISTORY — PX: FLEXIBLE SIGMOIDOSCOPY: SHX5431

## 2012-06-27 LAB — COMPREHENSIVE METABOLIC PANEL
ALT: 18 U/L (ref 0–35)
AST: 34 U/L (ref 0–37)
Alkaline Phosphatase: 43 U/L (ref 39–117)
Calcium: 8.9 mg/dL (ref 8.4–10.5)
Potassium: 3.3 mEq/L — ABNORMAL LOW (ref 3.5–5.1)
Sodium: 139 mEq/L (ref 135–145)
Total Protein: 5.8 g/dL — ABNORMAL LOW (ref 6.0–8.3)

## 2012-06-27 LAB — HEMOGLOBIN AND HEMATOCRIT, BLOOD: HCT: 37.6 % (ref 36.0–46.0)

## 2012-06-27 SURGERY — SIGMOIDOSCOPY, FLEXIBLE
Anesthesia: Moderate Sedation

## 2012-06-27 MED ORDER — MIDAZOLAM HCL 10 MG/2ML IJ SOLN
INTRAMUSCULAR | Status: AC
  Filled 2012-06-27: qty 2

## 2012-06-27 MED ORDER — FENTANYL CITRATE 0.05 MG/ML IJ SOLN
INTRAMUSCULAR | Status: AC
  Filled 2012-06-27: qty 2

## 2012-06-27 MED ORDER — VITAMIN D3 25 MCG (1000 UNIT) PO TABS
1000.0000 [IU] | ORAL_TABLET | Freq: Every day | ORAL | Status: DC
  Administered 2012-06-28: 1000 [IU] via ORAL
  Filled 2012-06-27 (×3): qty 1

## 2012-06-27 MED ORDER — SACCHAROMYCES BOULARDII 250 MG PO CAPS
250.0000 mg | ORAL_CAPSULE | Freq: Two times a day (BID) | ORAL | Status: DC
  Administered 2012-06-27 – 2012-06-28 (×3): 250 mg via ORAL
  Filled 2012-06-27 (×4): qty 1

## 2012-06-27 MED ORDER — RANITIDINE HCL 150 MG/10ML PO SYRP
150.0000 mg | ORAL_SOLUTION | Freq: Two times a day (BID) | ORAL | Status: DC
  Administered 2012-06-28: 150 mg via ORAL
  Filled 2012-06-27 (×2): qty 10

## 2012-06-27 MED ORDER — MIDAZOLAM HCL 10 MG/2ML IJ SOLN
INTRAMUSCULAR | Status: DC | PRN
  Administered 2012-06-27 (×2): 1 mg via INTRAVENOUS

## 2012-06-27 MED ORDER — METRONIDAZOLE 250 MG PO TABS
250.0000 mg | ORAL_TABLET | Freq: Three times a day (TID) | ORAL | Status: DC
  Administered 2012-06-27 – 2012-06-28 (×4): 250 mg via ORAL
  Filled 2012-06-27 (×9): qty 1

## 2012-06-27 MED ORDER — PANTOPRAZOLE SODIUM 40 MG PO TBEC
40.0000 mg | DELAYED_RELEASE_TABLET | Freq: Every morning | ORAL | Status: DC
  Administered 2012-06-27: 40 mg via ORAL

## 2012-06-27 MED ORDER — FENTANYL CITRATE 0.05 MG/ML IJ SOLN
INTRAMUSCULAR | Status: DC | PRN
  Administered 2012-06-27: 12.5 ug via INTRAVENOUS

## 2012-06-27 MED ORDER — SODIUM CHLORIDE 0.9 % IV SOLN: INTRAVENOUS | Status: DC

## 2012-06-27 NOTE — Consult Note (Signed)
Referring Provider: No ref. provider found Primary Care Physician:  Illene Regulus, MD Primary Gastroenterologist:  Dr. Leone Payor  Reason for Consultation:  Diarrhea  HPI: [name] is a 79 y.o. female who is a patient of Dr. Debby Bud.  She has a PMH of HTN, osteoporosis, and breast CA, but is otherwise doing very well for her age.  She was admitted by Dr. Debby Bud on 2/26 due to ongoing diarrhea and dark stools.  She reports that she was feeling fine on Sunday, however, Sunday night she developed what she called "profuse diarrhea".  At that time it was very dark in color and accompanied by some epigastric discomfort.  This continued intermittently for a couple of days before she called Dr. Debby Bud.  She was admitted for observation.  Labs were relatively unremarkable except for slightly low potassium.  Cdiff was negative.  Stool culture is pending.  Her diet was advanced yesterday and plan was to discharge her home, however, she started with diarrhea again this AM.  About 3 or 4 BMs that were brown in color, but she said are profuse like previously.  No blood.  She is heme negative.  No abdominal pain, just lower abdominal cramping that accompanies the diarrhea.  No nausea or vomiting.  Appetite is decreased, but was fine prior to this illness.  No recent weight loss.  Patient denies any new medications at home.  No recent sick contacts.  She is now on pantoprazole, but this is the only new medication that has been started since her admission.    Last colonoscopy over ten years ago by Dr. Leone Payor.   Past Medical History  Diagnosis Date  . Cancer     breast  . Cataract   . CAD (coronary artery disease)     cath w/ trivial disease  . Diverticulosis of colon   . Osteoporosis     s/p compression fx T12  . Macular degeneration     Past Surgical History  Procedure Laterality Date  . Abdominal hysterectomy    . Breast lumpectomy    . Excision for paget's vulvar    . Vertbroplasty      T12   . Mastectomy      left    Prior to Admission medications   Medication Sig Start Date End Date Taking? Authorizing Provider  Ascorbic Acid (VITAMIN C) 100 MG tablet Take 100 mg by mouth daily.     Yes Historical Provider, MD  cholecalciferol (VITAMIN D) 1000 UNITS tablet Take 1,000 Units by mouth daily.   Yes Historical Provider, MD  enalapril (VASOTEC) 2.5 MG tablet Take 1 tablet (2.5 mg total) by mouth daily. 03/04/12  Yes Jacques Navy, MD  fish oil-omega-3 fatty acids 1000 MG capsule Take 2 g by mouth daily.     Yes Historical Provider, MD  Multiple Vitamin (MULTIVITAMIN PO) Take by mouth.     Yes Historical Provider, MD  Multiple Vitamins-Minerals (ICAPS PO) Take by mouth daily.   Yes Historical Provider, MD  trastuzumab (HERCEPTIN) 440 MG injection Inject into the vein once.     Yes Historical Provider, MD  acetaminophen (TYLENOL) 500 MG tablet Take 500 mg by mouth at bedtime.     Historical Provider, MD  clobetasol ointment (TEMOVATE) 0.05 %  02/29/12   Historical Provider, MD  zolendronic acid (ZOMETA) 4 MG/5ML injection Inject 4 mg into the vein every 3 (three) months.      Historical Provider, MD    Current Facility-Administered Medications  Medication Dose Route Frequency Provider Last Rate Last Dose  . 0.45 % sodium chloride infusion  50 mL/hr Intravenous Continuous Jacques Navy, MD 50 mL/hr at 06/27/12 0030 50 mL/hr at 06/27/12 0030  . acetaminophen (TYLENOL) tablet 650 mg  650 mg Oral Q6H PRN Jacques Navy, MD   650 mg at 06/26/12 1715   Or  . acetaminophen (TYLENOL) suppository 650 mg  650 mg Rectal Q6H PRN Jacques Navy, MD      . acetaminophen (TYLENOL) tablet 500 mg  500 mg Oral QHS Jacques Navy, MD   500 mg at 06/26/12 2137  . enalapril (VASOTEC) tablet 2.5 mg  2.5 mg Oral Daily Jacques Navy, MD   2.5 mg at 06/26/12 1142  . feeding supplement (ENSURE COMPLETE) liquid 237 mL  237 mL Oral BID BM Lavena Bullion, RD   237 mL at 06/26/12 1500  .  pantoprazole (PROTONIX) EC tablet 40 mg  40 mg Oral q morning - 10a Jacques Navy, MD        Allergies as of 06/25/2012 - Review Complete 06/25/2012  Allergen Reaction Noted  . Solifenacin succinate      Family History  Problem Relation Age of Onset  . Cancer Sister     History   Social History  . Marital Status: Widowed    Spouse Name: N/A    Number of Children: N/A  . Years of Education: N/A   Occupational History  . Not on file.   Social History Main Topics  . Smoking status: Former Smoker    Quit date: 05/01/1990  . Smokeless tobacco: Never Used  . Alcohol Use: No  . Drug Use: No  . Sexually Active: No   Other Topics Concern  . Not on file   Social History Narrative   HSG, no college. Married '44- '97 widowed. 2 dtrs - '46, '54, 1 son - '49; 6 grandchildren; 5 great-grands. 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 She lives alone.    End of life care: Do not resuscitate; willing to have intensive care excluding mechanical ventilation.     Review of Systems: Ten point ROS is O/W negative except as mentioned in HPI.  Physical Exam: Vital signs in last 24 hours: Temp:  [97.7 F (36.5 C)-97.9 F (36.6 C)] 97.8 F (36.6 C) (02/28 0623) Pulse Rate:  [62-65] 65 (02/28 0623) Resp:  [16-17] 16 (02/28 0623) BP: (120-151)/(50-66) 142/66 mmHg (02/28 0623) SpO2:  [96 %-97 %] 97 % (02/28 0623) Weight:  [110 lb 14.3 oz (50.3 kg)] 110 lb 14.3 oz (50.3 kg) (02/28 0623) Last BM Date: 06/25/12 General:   Alert, Well-developed, well-nourished, pleasant and cooperative in NAD Head:  Normocephalic and atraumatic. Eyes:  Sclera clear, no icterus.  Conjunctiva pink. Ears:  Normal auditory acuity. Mouth:  No deformity or lesions.   Lungs:  Clear throughout to auscultation.  No wheezes, crackles, or rhonchi.  Heart:  Regular rate and rhythm; no murmurs, clicks, rubs,  or gallops. Abdomen:  Soft, nontender, BS hyperactive, nonpalp mass or hsm.    Rectal:  Deferred  Msk:  Symmetrical without gross deformities. Pulses:  Normal pulses noted. Extremities:  Without clubbing or edema. Neurologic:  Alert and  oriented x4;  grossly normal neurologically. Skin:  Intact without significant lesions or rashes. Psych:  Alert and cooperative. Normal mood and affect.  Intake/Output from previous day: 02/27 0701 - 02/28 0700 In: 840 [P.O.:840] Out: 1600 [Urine:1600]  Lab Results:  Recent Labs  06/25/12 1127 06/26/12 0403 06/27/12 0414  WBC 2.7*  --   --   HGB 13.4 11.7* 12.2  HCT 40.9 36.0 37.6  PLT 147*  --   --    BMET  Recent Labs  06/25/12 1127 06/27/12 0414  NA 138 139  K 3.2* 3.3*  CL 105 109  CO2 25 21  GLUCOSE 92 113*  BUN 9 7  CREATININE 0.71 0.67  CALCIUM 8.0* 8.9   LFT  Recent Labs  06/27/12 0414  PROT 5.8*  ALBUMIN 2.8*  AST 34  ALT 18  ALKPHOS 43  BILITOT 0.2*   IMPRESSION:  -Diarrhea:  Likely acute infectious gastroenteritis. -HTN -Osteoporosis -History of breast CA  PLAN: -Will check flex sig today. -Further recs pending those results.  ZEHR, JESSICA D.  06/27/2012, 9:49 AM  Pager number [phone] I have reviewed the above note, examined the patient and agree with plan of treatment.Acute diarrheal illness likely infectious, but also consider lymphocytic colitis. She has hyperactive B.S.'s and abd. distention with tympany. Would  use empirically Flagyl 250 mg po tid. I offered a flexible sigmoidoscopy with limited prep ans she iand her son are thinking about it. She is heme negative, normal Hgb on admission.constipation Probiotics, bowl rest.Would hold Pantoprazole since it can cause diarrhea.  W[facility] Pager # [phone]

## 2012-06-27 NOTE — Op Note (Addendum)
[facility] [address] [place], [address]   FLEX SIGMOIDOSCOPY PROCEDURE REPORT  PATIENT: [name], [name]  MR#: [id number] BIRTHDATE: 07/16/1913 , 89  yrs. old GENDER: Female ENDOSCOPIST: Hart Carwin, MD REFERRED [id number] Esther Hardy, M.D. PROCEDURE DATE:  06/27/2012 PROCEDURE:   Sigmoidoscopy with biopsy INDICATIONS:unexplained diarrhea and acute diarrhea, heme negative, stool studies negative. MEDICATIONS: These medications were titrated to patient response per physician's verbal order, Fentanyl 12.5 mcg IV, and Versed 2 mg IV   DESCRIPTION OF PROCEDURE:    Physical exam was performed.  Informed consent was obtained from the patient after explaining the benefits, risks, and alternatives to procedure.  The patient was connected to monitor and placed in left lateral position. Continuous oxygen was provided by nasal cannula and IV medicine administered through an indwelling cannula.  After administration of sedation and rectal exam, the patients rectum was intubated and the     colonoscope was advanced under direct visualization to the descending colon at 40 cm  The scope was removed slowly by carefully examining the color, texture, anatomy, and integrity mucosa on the way out.Random biopsies were taken to r/o microscopic colitis. There was solid stool in the colon.  Stool specomen was obtained for Lactoferin. Rectal sphincter tone was decreased  The patient was recovered in endoscopy and discharged home in satisfactory condition.       COLON FINDINGS: There was moderate diverticulosis noted in the sigmoid colon with associated tortuosity, muscular hypertrophy and colonic narrowing.  PREP QUALITY: fair, solid stool in the sigmoid colon   COMPLICATIONS: None  ENDOSCOPIC IMPRESSION: There was moderate diverticulosis noted in the sigmoid colon no evidence of acute colitis, random biopsies taken to r/o microscopic colitis sigmoid polyp removed  with cold biopsies from 20 cm decreased rectal sphincter tone I suspect an infestious diarrhea, the fact that she has a formed stool in the colon indicates an improvement in her condition,  RECOMMENDATIONS:  await biopsy results low residue diet Flagyl 250 mg po tid x 5 days Probiotic 1 po qd stool for Lactoferrin obtained  during the procedure      _______________________________ eSigned:  Hart Carwin, MD 06/27/2012 3:22 PM     PATIENT NAME:  [name], [name] MR#: [id number]

## 2012-06-27 NOTE — Interval H&P Note (Signed)
History and Physical Interval Note:  06/27/2012 2:38 PM  [name]  has presented today for surgery, with the diagnosis of diarrhea  The various methods of treatment have been discussed with the patient and family. After consideration of risks, benefits and other options for treatment, the patient has consented to  Procedure(s): FLEXIBLE SIGMOIDOSCOPY (N/A) as a surgical intervention .  The patient's history has been reviewed, patient examined, no change in status, stable for surgery.  I have reviewed the patient's chart and labs.  Questions were answered to the patient's satisfaction.     Lina Sar

## 2012-06-27 NOTE — Progress Notes (Signed)
Subjective: [name] reports the recurrence of diarrhea starting at 0215 hrs. With 3-5 BMs to follow, described as liquid stools. She does not report any blood in the stool and RN confirms that there was no blood or mucus. Stool was brown with some formed segments. She denies any pain but has had cramping discomfort.   Objective: Lab: Lab Results  Component Value Date   WBC 2.7* 06/25/2012   HGB 12.2 06/27/2012   HCT 37.6 06/27/2012   MCV 91.1 06/25/2012   PLT 147* 06/25/2012   BMET    Component Value Date/Time   NA 138 06/25/2012 1127   NA 142 06/11/2012 1322   K 3.2* 06/25/2012 1127   K 3.7 06/11/2012 1322   CL 105 06/25/2012 1127   CL 108* 06/11/2012 1322   CO2 25 06/25/2012 1127   CO2 28 06/11/2012 1322   GLUCOSE 92 06/25/2012 1127   GLUCOSE 93 06/11/2012 1322   BUN 9 06/25/2012 1127   BUN 18.1 06/11/2012 1322   CREATININE 0.71 06/25/2012 1127   CREATININE 0.9 06/11/2012 1322   CALCIUM 8.0* 06/25/2012 1127   CALCIUM 10.3 06/11/2012 1322   GFRNONAA 71* 06/25/2012 1127   GFRAA 82* 06/25/2012 1127     Imaging:  Scheduled Meds: . acetaminophen  500 mg Oral QHS  . enalapril  2.5 mg Oral Daily  . feeding supplement  237 mL Oral BID BM  . pantoprazole  40 mg Oral BID   Continuous Infusions: . sodium chloride 50 mL/hr (06/27/12 0030)   PRN Meds:.acetaminophen, acetaminophen   Physical Exam: Filed Vitals:   06/27/12 0623  BP: 142/66  Pulse: 65  Temp: 97.8 F (36.6 C)  Resp: 16   Gen'l - elderly whie woman in no acute distress but unhappy about the diarrhea HEENT- C&S w/o icterus Cor- RRR Pulm - normal respirations Abd - BS+-overactive, no guarding or rebound, no real tenderness to palpation. Neuro - HOH but otherwise normal      Assessment/Plan: 1. GI - Hgb is stable. She is on protonix bid - possible cause of diarrhea. Electrolytes 2/26 of except slightly low K.C.Diff negative. No stool culture results. No evidence of GI bleeding.  Plan Lab: stool culture pending;  CMet ordered  Continue IV fluid  Reduce Protonix to qAM  GI consult   Change status to in-patient.   Illene Regulus [place] [facility] [phone]) [phone]; (c) [phone] Call-grp - Pats[facility] Sears IM  Tele: [phone]  06/27/2012, 6:59 AM

## 2012-06-28 MED ORDER — RANITIDINE HCL 150 MG PO TABS: 150.0000 mg | ORAL_TABLET | Freq: Two times a day (BID) | ORAL | Status: DC

## 2012-06-28 MED ORDER — METRONIDAZOLE 250 MG PO TABS: 250.0000 mg | ORAL_TABLET | Freq: Three times a day (TID) | ORAL | Status: DC

## 2012-06-28 MED ORDER — SACCHAROMYCES BOULARDII 250 MG PO CAPS: 250.0000 mg | ORAL_CAPSULE | Freq: Two times a day (BID) | ORAL | Status: DC

## 2012-06-28 NOTE — Progress Notes (Signed)
Subjective: Dr. Regino Schultze help is appreciated: consult note and flex sig report reviewed.   Objective: Lab: Lab Results  Component Value Date   WBC 2.7* 06/25/2012   HGB 12.2 06/27/2012   HCT 37.6 06/27/2012   MCV 91.1 06/25/2012   PLT 147* 06/25/2012   BMET    Component Value Date/Time   NA 139 06/27/2012 0414   NA 142 06/11/2012 1322   K 3.3* 06/27/2012 0414   K 3.7 06/11/2012 1322   CL 109 06/27/2012 0414   CL 108* 06/11/2012 1322   CO2 21 06/27/2012 0414   CO2 28 06/11/2012 1322   GLUCOSE 113* 06/27/2012 0414   GLUCOSE 93 06/11/2012 1322   BUN 7 06/27/2012 0414   BUN 18.1 06/11/2012 1322   CREATININE 0.67 06/27/2012 0414   CREATININE 0.9 06/11/2012 1322   CALCIUM 8.9 06/27/2012 0414   CALCIUM 10.3 06/11/2012 1322   GFRNONAA 72* 06/27/2012 0414   GFRAA 83* 06/27/2012 0414     Imaging:  Scheduled Meds: . acetaminophen  500 mg Oral QHS  . cholecalciferol  1,000 Units Oral Daily  . enalapril  2.5 mg Oral Daily  . feeding supplement  237 mL Oral BID BM  . metroNIDAZOLE  250 mg Oral TID  . ranitidine  150 mg Oral BID  . saccharomyces boulardii  250 mg Oral BID   Continuous Infusions: . sodium chloride 50 mL/hr (06/28/12 0707)   PRN Meds:.acetaminophen, acetaminophen   Physical Exam: Filed Vitals:   06/28/12 0642  BP: 110/48  Pulse: 62  Temp: 98.1 F (36.7 C)  Resp: 16   See d/c summary     Assessment/Plan: Infectious diarrhea - resolving. Pt stable. For d/c home. D/c # [id number]   Illene Regulus [place] [facility] (o) [phone]; (c) [phone] Call-grp - Patsi Sears IM  Tele: [phone]  06/28/2012, 8:53 AM

## 2012-06-28 NOTE — Care Management Note (Signed)
   CARE MANAGEMENT NOTE 06/28/2012  Patient:  [name], [name]   Account Number:  [id number]  Date Initiated:  06/25/2012  Documentation initiated by:  DAVIS,TYMEEKA  Subjective/Objective Assessment:   79 yo femlae admitted with upper GI bleed.PTA pt lived home with family assisting in home care.     Action/Plan:   Home when stable  06/28/2012 Plan for d/c to home today, pt states that she and son have decided to wait until after d/c to determine if [facility] needed. CM informed pt that is this is determined then son will need to call MD office to arrange.   Anticipated DC Date:  06/28/2012   Anticipated DC Plan:  HOME/SELF CARE         Choice offered to / List presented to:  NA           Status of service:  Completed, signed off Medicare Important Message given?   (If response is "NO", the following Medicare IM given date fields will be blank) Date Medicare IM given:   Date Additional Medicare IM given:    Discharge Disposition:    Per UR Regulation:  Reviewed for med. necessity/level of care/duration of stay  If discussed at Long Length of Stay Meetings, dates discussed:    Comments:  06/27/12 0947 Leonie Green [phone] CM spoke with patient concerning discharge planning. MD order for HHRN/PT/HHA. Pt states not sure if she wants HH services at this time. CM provided choice list for [facility]. No choice made at this time. Cm explained to patient she can follow up with PCP for [facility] service set up if unable to make choice prior to discharge.   06/25/12 Leonie Green [phone]

## 2012-06-28 NOTE — Progress Notes (Signed)
Doing well, post flex sigmoidoscopy, she had a formed stool today, tolerating low residue diet, Home on Flagyl 250 tid. Will be happy to see in the office ( she is Dr [name] pt) prn. Viral gastroenteritis with ?bacterial overgrowth

## 2012-06-28 NOTE — Progress Notes (Signed)
  Subjective:    Patient ID: [name], female    DOB: [birth year], 79 y.o.   MRN: [id number]  HPI Patiet brought to the office for purpose of admitting to hospital for report of melena and diarrhea. See H&P   Review of Systems     Objective:   Physical Exam        Assessment & Plan:

## 2012-06-28 NOTE — Progress Notes (Signed)
06/28/12 1100 Reviewed discharge instructions with patient. Patient verbalized understanding. Prescriptions were sent to [facility] patient aware and will go by to pick them up. Copy of discharge instructions given to patient. Patient wants to discuss home health options with son before making arrangements.

## 2012-06-28 NOTE — Discharge Summary (Signed)
NAMECASSANDRIA, [name]             ACCOUNT NO.:  [id number]  MEDICAL RECORD NO.:  [id number]  LOCATION:  1312                         FACILITY:  [facility]  PHYSICIAN:  Rosalyn Gess. Teckla Christiansen, MD  DATE OF BIRTH:  1914/03/26  DATE OF ADMISSION:  06/25/2012 DATE OF DISCHARGE:  06/28/2012                              DISCHARGE SUMMARY   ADMITTING DIAGNOSIS:  Refractory diarrhea/melena.  DISCHARGE DIAGNOSIS:  Gastroenteritis.  CONSULTANTS:  Hedwig Morton. Juanda Chance, M.D. for GI.  PROCEDURES:  Flexible sigmoidoscopy performed on June 27, 2012, which revealed moderate diverticulosis in the sigmoid colon.  No evidence of acute colitis.  Mildly decreased sphincter tone was noted. Biopsies were performed and are pending at the time of discharge dictation.  Final impression was infectious diarrhea with formed stool in the colon, indicating improvement in condition.  HISTORY OF PRESENT ILLNESS:  [name] is a very independent alert and chipper 79 year old woman with a history of recurrent breast cancer. She reports that on the previous Sunday she began having upper abdominal pain followed by lower abdominal cramping and then frequent soft stools. She continued to have problem with increased frequency of loose stools that were sudden of onset.  She described them as being black and tarry and odiferous.  She denied any chest pain, shortness of breath, but did feel weak.  Due to her report of what sounded like melanic stools, she was directly admitted with concern for an upper GI bleed.  Please see the H and P for past medical history, family history, social history, and admission exam.  HOSPITAL COURSE:  GI:  The patient was admitted to a Med/Surg bed. Initial laboratory revealed a hemoglobin that was in normal range at 13.4 g.  The patient did have several more stools, but by report, they were brown in color, with no sign of blood, no sign of mucus, no evidence of melena.  Stool was sent for C. diff  which returned as negative.  Stool cultures were sent, successive stools with cultures pending at the time of discharge dictation.  The patient did drop her hemoglobin from 13.4 to 11.7 g.  The patient's treatment included Protonix 40 mg b.i.d. for possible upper GI bleed.  With the return of a hemoglobin of 12.2 g on June 27, 2012, and recurrent diarrhea.  Protonix was cut back to once a day and then discontinued by the Gastroenterology consultant.  The patient was seen by the GI Service and was taken for flexible sigmoidoscopy with results as above  The patient, on the day of discharge, was markedly improved.  She had been able tolerate a diet without difficulty and had no recurrent diarrheal stools.  With the patient having a full evaluation with treatment with Flagyl being started with return of a normal appetite with normal hemodynamics, the patient was felt to be stable and ready for discharge home.  DISCHARGE PHYSICAL EXAMINATION:  VITAL SIGNS:  Temperature was 98.1, blood pressure 110/48, pulse was 62, respirations 16, oxygen saturation 95% on room air. GENERAL APPEARANCE:  This is a very spry 79 year old woman, sitting on the side of the bed, reading her novel.  She is awake and alert, and comfortable. HEENT:  Conjunctivae and  sclerae were clear. CHEST:  The patient is moving air well with no rales, wheezes, or rhonchi. CARDIOVASCULAR:  The patient had a quiet precordium with a regular rate and rhythm. ABDOMEN:  Positive bowel sounds are noted.  Abdomen is soft.  No guarding or rebound is noted. NEUROLOGIC:  The patient is awake, alert, oriented, and cognitively intact.  FINAL LABORATORY DATA:  Chemistry from June 27, 2012, with a sodium of 139, potassium 3.3, chloride 109, CO2 21, BUN of 7, creatinine 0.67, glucose was 113.  Albumin was slightly low at 2.8.  Liver functions were otherwise unremarkable.  Total protein was slightly low at 5.8, total bilirubin  was slightly low at 0.2.  Final hemoglobin was 12.2 g.  DISPOSITION:  The patient was discharged home to independent living. Her family does check on her frequently and is attentive.  She will resume all of her home medications, but we will have her complete additional 4 days of Flagyl as per GI recommendations, and the patient will be on Zantac 150 b.i.d. to ensure any gastric irritation has a chance to heal.  The patient will be seen in followup in 5 days.  The patient's condition at the time of discharge dictation is stable.     Rosalyn Gess Nary Sneed, MD     MEN/MEDQ  D:  06/28/2012  T:  06/28/2012  Job:  [id number]  cc:   Hedwig Morton. Juanda Chance, MD [address] [place] [address]

## 2012-06-30 ENCOUNTER — Encounter (HOSPITAL_COMMUNITY): Payer: Self-pay | Admitting: Internal Medicine

## 2012-06-30 ENCOUNTER — Telehealth: Payer: Self-pay | Admitting: *Deleted

## 2012-06-30 ENCOUNTER — Ambulatory Visit: Payer: Medicare Other | Admitting: Gynecology

## 2012-07-01 ENCOUNTER — Telehealth: Payer: Self-pay | Admitting: *Deleted

## 2012-07-01 ENCOUNTER — Telehealth: Payer: Self-pay | Admitting: Internal Medicine

## 2012-07-01 ENCOUNTER — Telehealth: Payer: Self-pay | Admitting: General Practice

## 2012-07-01 ENCOUNTER — Encounter: Payer: Self-pay | Admitting: Internal Medicine

## 2012-07-01 LAB — STOOL CULTURE

## 2012-07-01 NOTE — Telephone Encounter (Signed)
Patient was transferred to me to schedule her hospital follow up which per patient needed to be on Thursday, offered patient morning appt for Thursday explaining you will be off that afternoon and patient got very upset demanding to be seen in the afternoon on Thursday or Friday, please advise when you would like for her to be scheduled

## 2012-07-01 NOTE — Telephone Encounter (Signed)
Good news for [name] - my Thursday PM meeting was cancelled due to prediction of inclement weather so she can be seen Thursday afternoon.  Please open my Thursday PM schedule. Thanks

## 2012-07-01 NOTE — Telephone Encounter (Signed)
Transitional care call: Patient dc'd on 3/1.  Diagnosis:  Diverticulosis.   Spoke with patient.  Patient states that she is doing "so so".  Pt denies abdominal pain or loose stools.  Pt also denies shortness of breath or chest pain but does feel weak.  Pt does not have questions regarding hospital dc instructions or medications.  RN reviewed all medications with pt and pt states that she has all medications in the home.  Patient lives alone but has family that checks in frequently.  Follow up appointment made with Dr. Debby Bud.

## 2012-07-01 NOTE — Telephone Encounter (Signed)
Pt called to inquire about treatment for 07/02/2012- per discussion pt stated recent inpatient stay for severe diarrhea with d/c on Saturday. She is still on oral antibiotics and feeling very tired.  Per review with AB/PA appointments for this Wednesday can be cancelled.  Pt informed of above.

## 2012-07-02 ENCOUNTER — Other Ambulatory Visit: Payer: Medicare Other | Admitting: Lab

## 2012-07-02 ENCOUNTER — Ambulatory Visit: Payer: Medicare Other

## 2012-07-02 NOTE — Telephone Encounter (Signed)
Spoke with patient she is scheduled 07/03/12

## 2012-07-03 ENCOUNTER — Encounter: Payer: Self-pay | Admitting: Internal Medicine

## 2012-07-03 ENCOUNTER — Ambulatory Visit (INDEPENDENT_AMBULATORY_CARE_PROVIDER_SITE_OTHER): Payer: Medicare Other | Admitting: Internal Medicine

## 2012-07-03 VITALS — BP 120/88 | HR 68 | Temp 95.1°F | Resp 12 | Wt 109.0 lb

## 2012-07-03 DIAGNOSIS — A09 Infectious gastroenteritis and colitis, unspecified: Secondary | ICD-10-CM

## 2012-07-03 NOTE — Patient Instructions (Addendum)
1. Viral gastroenteritis - seems to have resolved. You may resume a normal diet. The Ranitidine is to take as needed for acid indigestion.  2. Urinary frequency - may be over actvie bladder. To make a diagnosis please take Myrbetriq once a day for several days. If the urinary frequency and urgency is better this will make the diagnosis of OAB. If there is no improvement and you want to pursue this problem it will mean a referral to a urologist.  3. You otherwise seem to be doing well. My best wishes for a speedy recovery for your son.

## 2012-07-05 NOTE — Progress Notes (Signed)
Subjective:    Patient ID: [name], female    DOB: 1913-08-14, 79 y.o.   MRN: [id number]  HPI Mrs. Alberico presents after recent hospitalization for infectious diarrhea. Her evaluation included flex sig which was unremarkable. She was discharged home on flagyl and low residue diet.  Since discharge she has been doing well. There has been no recurrent diarrhea and she has been tolerating her diet, in fact she would like to advance to a regular diet.   Unfortunately he son was hospitalized with a SBO and PE, so she is understandably worried.  Past Medical History  Diagnosis Date  . Cancer     breast  . Cataract   . CAD (coronary artery disease)     cath w/ trivial disease  . Diverticulosis of colon   . Osteoporosis     s/p compression fx T12  . Macular degeneration   . Breast cancer    Past Surgical History  Procedure Laterality Date  . Abdominal hysterectomy    . Breast lumpectomy    . Excision for paget's vulvar    . Vertbroplasty      T12  . Mastectomy      left  . Flexible sigmoidoscopy N/A 06/27/2012    Procedure: FLEXIBLE SIGMOIDOSCOPY;  Surgeon: Hart Carwin, MD;  Location: [facility];  Service: Endoscopy;  Laterality: N/A;   Family History  Problem Relation Age of Onset  . Cancer Sister    History   Social History  . Marital Status: Widowed    Spouse Name: N/A    Number of Children: N/A  . Years of Education: N/A   Occupational History  . Not on file.   Social History Main Topics  . Smoking status: Former Smoker    Quit date: 05/01/1990  . Smokeless tobacco: Never Used  . Alcohol Use: No  . Drug Use: No  . Sexually Active: No   Other Topics Concern  . Not on file   Social History Narrative   HSG, no college. Married '44- '97 widowed. 2 dtrs - '46, '54, 1 son - '49; 6 grandchildren; 5 great-grands. 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 She lives alone.    End of life care: Do not resuscitate; willing to have  intensive care excluding mechanical ventilation.     Current Outpatient Prescriptions on File Prior to Visit  Medication Sig Dispense Refill  . acetaminophen (TYLENOL) 500 MG tablet Take 500 mg by mouth at bedtime.       . Ascorbic Acid (VITAMIN C) 100 MG tablet Take 100 mg by mouth daily.        . cholecalciferol (VITAMIN D) 1000 UNITS tablet Take 1,000 Units by mouth daily.      . clobetasol ointment (TEMOVATE) 0.05 %       . enalapril (VASOTEC) 2.5 MG tablet Take 1 tablet (2.5 mg total) by mouth daily.  30 tablet  11  . fish oil-omega-3 fatty acids 1000 MG capsule Take 2 g by mouth daily.        . metroNIDAZOLE (FLAGYL) 250 MG tablet Take 1 tablet (250 mg total) by mouth 3 (three) times daily.  12 tablet  0  . Multiple Vitamin (MULTIVITAMIN PO) Take by mouth.        . Multiple Vitamins-Minerals (ICAPS PO) Take by mouth daily.      . ranitidine (ZANTAC) 150 MG tablet Take 1 tablet (150 mg total) by mouth 2 (two) times daily.  60 tablet  1  . saccharomyces boulardii (FLORASTOR) 250 MG capsule Take 1 capsule (250 mg total) by mouth 2 (two) times daily.  60 capsule  0  . trastuzumab (HERCEPTIN) 440 MG injection Inject into the vein once.        . zolendronic acid (ZOMETA) 4 MG/5ML injection Inject 4 mg into the vein every 3 (three) months.         No current facility-administered medications on file prior to visit.      Review of Systems System review is negative for any constitutional, cardiac, pulmonary, GI or neuro symptoms or complaints other than as described in the HPI.     Objective:   Physical Exam Filed Vitals:   07/03/12 1311  BP: 120/88  Pulse: 68  Temp: 95.1 F (35.1 C)  Resp: 12   Wt Readings from Last 3 Encounters:  07/03/12 109 lb (49.442 kg)  06/27/12 110 lb 14.3 oz (50.3 kg)  06/27/12 110 lb 14.3 oz (50.3 kg)   Gen'l- an elderly but perky 79 y/o in no distress Cor 2+ radial pulse Pulm - normal respirations Abd - BS+, no tenderness, no guarding. Neuro -  A&O x 3. Affect is a little sad and worried.       Assessment & Plan:  Gastroenteritis - making a good recovery.  Plan Resume a regular diet  Complete course of flagyl

## 2012-07-22 ENCOUNTER — Other Ambulatory Visit: Payer: Self-pay | Admitting: *Deleted

## 2012-07-22 DIAGNOSIS — C50919 Malignant neoplasm of unspecified site of unspecified female breast: Secondary | ICD-10-CM

## 2012-07-23 ENCOUNTER — Other Ambulatory Visit: Payer: Self-pay | Admitting: Oncology

## 2012-07-23 ENCOUNTER — Ambulatory Visit (HOSPITAL_BASED_OUTPATIENT_CLINIC_OR_DEPARTMENT_OTHER): Payer: Medicare Other

## 2012-07-23 ENCOUNTER — Other Ambulatory Visit (HOSPITAL_BASED_OUTPATIENT_CLINIC_OR_DEPARTMENT_OTHER): Payer: Medicare Other | Admitting: Lab

## 2012-07-23 VITALS — BP 157/85 | HR 64 | Temp 97.5°F | Resp 16

## 2012-07-23 DIAGNOSIS — C50519 Malignant neoplasm of lower-outer quadrant of unspecified female breast: Secondary | ICD-10-CM

## 2012-07-23 DIAGNOSIS — Z5112 Encounter for antineoplastic immunotherapy: Secondary | ICD-10-CM

## 2012-07-23 DIAGNOSIS — C50919 Malignant neoplasm of unspecified site of unspecified female breast: Secondary | ICD-10-CM

## 2012-07-23 LAB — COMPREHENSIVE METABOLIC PANEL (CC13)
Alkaline Phosphatase: 64 U/L (ref 40–150)
BUN: 13.9 mg/dL (ref 7.0–26.0)
Creatinine: 0.9 mg/dL (ref 0.6–1.1)
Glucose: 100 mg/dl — ABNORMAL HIGH (ref 70–99)
Sodium: 142 mEq/L (ref 136–145)
Total Bilirubin: 0.35 mg/dL (ref 0.20–1.20)

## 2012-07-23 LAB — CBC WITH DIFFERENTIAL/PLATELET
Basophils Absolute: 0 10*3/uL (ref 0.0–0.1)
EOS%: 5.1 % (ref 0.0–7.0)
HGB: 13.7 g/dL (ref 11.6–15.9)
LYMPH%: 18 % (ref 14.0–49.7)
MCH: 29.6 pg (ref 25.1–34.0)
MCV: 92 fL (ref 79.5–101.0)
MONO%: 11.9 % (ref 0.0–14.0)
Platelets: 126 10*3/uL — ABNORMAL LOW (ref 145–400)
RDW: 14.4 % (ref 11.2–14.5)

## 2012-07-23 MED ORDER — ACETAMINOPHEN 325 MG PO TABS
650.0000 mg | ORAL_TABLET | Freq: Once | ORAL | Status: AC
  Administered 2012-07-23: 650 mg via ORAL

## 2012-07-23 MED ORDER — TRASTUZUMAB CHEMO INJECTION 440 MG
6.0000 mg/kg | Freq: Once | INTRAVENOUS | Status: AC
  Administered 2012-07-23: 294 mg via INTRAVENOUS
  Filled 2012-07-23: qty 14

## 2012-07-23 MED ORDER — SODIUM CHLORIDE 0.9 % IV SOLN
Freq: Once | INTRAVENOUS | Status: AC
  Administered 2012-07-23: 12:00:00 via INTRAVENOUS

## 2012-07-23 NOTE — Patient Instructions (Addendum)
[facility] Discharge Instructions for Patients Receiving Chemotherapy  Today you received the following chemotherapy agents Herceptin.  To help prevent nausea and vomiting after your treatment, we encourage you to take your nausea medication as prescribed.   If you develop nausea and vomiting that is not controlled by your nausea medication, call the clinic. If it is after clinic hours your family physician or the after hours number for the clinic or go to the Emergency Department.   BELOW ARE SYMPTOMS THAT SHOULD BE REPORTED IMMEDIATELY:  *FEVER GREATER THAN 100.5 F  *CHILLS WITH OR WITHOUT FEVER  NAUSEA AND VOMITING THAT IS NOT CONTROLLED WITH YOUR NAUSEA MEDICATION  *UNUSUAL SHORTNESS OF BREATH  *UNUSUAL BRUISING OR BLEEDING  TENDERNESS IN MOUTH AND THROAT WITH OR WITHOUT PRESENCE OF ULCERS  *URINARY PROBLEMS  *BOWEL PROBLEMS  UNUSUAL RASH Items with * indicate a potential emergency and should be followed up as soon as possible.  Feel free to call the clinic you have any questions or concerns. The clinic phone number is [phone].   I have been informed and understand all the instructions given to me. I know to contact the clinic, my physician, or go to the Emergency Department if any problems should occur. I do not have any questions at this time, but understand that I may call the clinic during office hours   should I have any questions or need assistance in obtaining follow up care.    __________________________________________  _____________  __________ Signature of Patient or Authorized Representative            Date                   Time    __________________________________________ Nurse's Signature    

## 2012-07-27 DIAGNOSIS — A09 Infectious gastroenteritis and colitis, unspecified: Secondary | ICD-10-CM

## 2012-08-12 ENCOUNTER — Other Ambulatory Visit: Payer: Self-pay | Admitting: Physician Assistant

## 2012-08-12 DIAGNOSIS — C50919 Malignant neoplasm of unspecified site of unspecified female breast: Secondary | ICD-10-CM

## 2012-08-13 ENCOUNTER — Other Ambulatory Visit (HOSPITAL_BASED_OUTPATIENT_CLINIC_OR_DEPARTMENT_OTHER): Payer: Medicare Other | Admitting: Lab

## 2012-08-13 ENCOUNTER — Ambulatory Visit (HOSPITAL_BASED_OUTPATIENT_CLINIC_OR_DEPARTMENT_OTHER): Payer: Medicare Other

## 2012-08-13 VITALS — BP 158/79 | HR 54 | Temp 97.4°F

## 2012-08-13 DIAGNOSIS — Z5112 Encounter for antineoplastic immunotherapy: Secondary | ICD-10-CM

## 2012-08-13 DIAGNOSIS — C773 Secondary and unspecified malignant neoplasm of axilla and upper limb lymph nodes: Secondary | ICD-10-CM

## 2012-08-13 DIAGNOSIS — C50919 Malignant neoplasm of unspecified site of unspecified female breast: Secondary | ICD-10-CM

## 2012-08-13 DIAGNOSIS — C44599 Other specified malignant neoplasm of skin of other part of trunk: Secondary | ICD-10-CM

## 2012-08-13 DIAGNOSIS — M810 Age-related osteoporosis without current pathological fracture: Secondary | ICD-10-CM

## 2012-08-13 LAB — CBC WITH DIFFERENTIAL/PLATELET
Basophils Absolute: 0 10*3/uL (ref 0.0–0.1)
Eosinophils Absolute: 0.2 10*3/uL (ref 0.0–0.5)
HCT: 42.7 % (ref 34.8–46.6)
HGB: 14 g/dL (ref 11.6–15.9)
LYMPH%: 19.4 % (ref 14.0–49.7)
MCV: 91.6 fL (ref 79.5–101.0)
MONO#: 0.5 10*3/uL (ref 0.1–0.9)
MONO%: 11.1 % (ref 0.0–14.0)
NEUT#: 3.2 10*3/uL (ref 1.5–6.5)
Platelets: 159 10*3/uL (ref 145–400)
WBC: 4.9 10*3/uL (ref 3.9–10.3)

## 2012-08-13 LAB — COMPREHENSIVE METABOLIC PANEL (CC13)
ALT: 16 U/L (ref 0–55)
CO2: 25 mEq/L (ref 22–29)
Chloride: 108 mEq/L — ABNORMAL HIGH (ref 98–107)
Creatinine: 0.9 mg/dL (ref 0.6–1.1)
Glucose: 89 mg/dl (ref 70–99)
Total Bilirubin: 0.5 mg/dL (ref 0.20–1.20)
Total Protein: 7.5 g/dL (ref 6.4–8.3)

## 2012-08-13 MED ORDER — ZOLEDRONIC ACID 4 MG/5ML IV CONC
3.0000 mg | Freq: Once | INTRAVENOUS | Status: AC
  Administered 2012-08-13: 3 mg via INTRAVENOUS
  Filled 2012-08-13: qty 3.75

## 2012-08-13 MED ORDER — SODIUM CHLORIDE 0.9 % IV SOLN
Freq: Once | INTRAVENOUS | Status: AC
  Administered 2012-08-13: 12:00:00 via INTRAVENOUS

## 2012-08-13 MED ORDER — ACETAMINOPHEN 325 MG PO TABS
650.0000 mg | ORAL_TABLET | Freq: Once | ORAL | Status: AC
  Administered 2012-08-13: 650 mg via ORAL

## 2012-08-13 MED ORDER — TRASTUZUMAB CHEMO INJECTION 440 MG
6.0000 mg/kg | Freq: Once | INTRAVENOUS | Status: AC
  Administered 2012-08-13: 294 mg via INTRAVENOUS
  Filled 2012-08-13: qty 14

## 2012-08-13 NOTE — Patient Instructions (Addendum)
[facility] Discharge Instructions for Patients Receiving Chemotherapy  Today you received the following chemotherapy agents Herceptin and Zometa  To help prevent nausea and vomiting after your treatment, we encourage you to take your nausea medication as prescribed.   If you develop nausea and vomiting that is not controlled by your nausea medication, call the clinic. If it is after clinic hours your family physician or the after hours number for the clinic or go to the Emergency Department.   BELOW ARE SYMPTOMS THAT SHOULD BE REPORTED IMMEDIATELY:  *FEVER GREATER THAN 100.5 F  *CHILLS WITH OR WITHOUT FEVER  NAUSEA AND VOMITING THAT IS NOT CONTROLLED WITH YOUR NAUSEA MEDICATION  *UNUSUAL SHORTNESS OF BREATH  *UNUSUAL BRUISING OR BLEEDING  TENDERNESS IN MOUTH AND THROAT WITH OR WITHOUT PRESENCE OF ULCERS  *URINARY PROBLEMS  *BOWEL PROBLEMS  UNUSUAL RASH Items with * indicate a potential emergency and should be followed up as soon as possible.   Feel free to call the clinic you have any questions or concerns. The clinic phone number is [phone].   I have been informed and understand all the instructions given to me. I know to contact the clinic, my physician, or go to the Emergency Department if any problems should occur. I do not have any questions at this time, but understand that I may call the clinic during office hours   should I have any questions or need assistance in obtaining follow up care.    __________________________________________  _____________  __________ Signature of Patient or Authorized Representative            Date                   Time    __________________________________________ Nurse's Signature

## 2012-08-22 NOTE — Telephone Encounter (Signed)
No entry 

## 2012-09-02 ENCOUNTER — Other Ambulatory Visit: Payer: Self-pay | Admitting: Physician Assistant

## 2012-09-02 DIAGNOSIS — C50919 Malignant neoplasm of unspecified site of unspecified female breast: Secondary | ICD-10-CM

## 2012-09-03 ENCOUNTER — Encounter: Payer: Self-pay | Admitting: Physician Assistant

## 2012-09-03 ENCOUNTER — Telehealth: Payer: Self-pay | Admitting: *Deleted

## 2012-09-03 ENCOUNTER — Ambulatory Visit (HOSPITAL_BASED_OUTPATIENT_CLINIC_OR_DEPARTMENT_OTHER): Payer: Medicare Other | Admitting: Physician Assistant

## 2012-09-03 ENCOUNTER — Ambulatory Visit (HOSPITAL_BASED_OUTPATIENT_CLINIC_OR_DEPARTMENT_OTHER): Payer: Medicare Other

## 2012-09-03 ENCOUNTER — Other Ambulatory Visit (HOSPITAL_BASED_OUTPATIENT_CLINIC_OR_DEPARTMENT_OTHER): Payer: Medicare Other | Admitting: Lab

## 2012-09-03 VITALS — BP 150/75 | HR 62 | Temp 97.9°F | Resp 98 | Ht 61.0 in | Wt 107.3 lb

## 2012-09-03 DIAGNOSIS — C50919 Malignant neoplasm of unspecified site of unspecified female breast: Secondary | ICD-10-CM

## 2012-09-03 DIAGNOSIS — Z5112 Encounter for antineoplastic immunotherapy: Secondary | ICD-10-CM

## 2012-09-03 DIAGNOSIS — C50519 Malignant neoplasm of lower-outer quadrant of unspecified female breast: Secondary | ICD-10-CM

## 2012-09-03 DIAGNOSIS — Z171 Estrogen receptor negative status [ER-]: Secondary | ICD-10-CM

## 2012-09-03 LAB — COMPREHENSIVE METABOLIC PANEL (CC13)
Alkaline Phosphatase: 57 U/L (ref 40–150)
BUN: 14.4 mg/dL (ref 7.0–26.0)
Glucose: 84 mg/dl (ref 70–99)
Sodium: 140 mEq/L (ref 136–145)
Total Bilirubin: 0.59 mg/dL (ref 0.20–1.20)

## 2012-09-03 LAB — CBC WITH DIFFERENTIAL/PLATELET
Basophils Absolute: 0 10*3/uL (ref 0.0–0.1)
EOS%: 3.6 % (ref 0.0–7.0)
HGB: 13.4 g/dL (ref 11.6–15.9)
MCH: 29.8 pg (ref 25.1–34.0)
MCV: 91.1 fL (ref 79.5–101.0)
MONO%: 9.6 % (ref 0.0–14.0)
NEUT%: 62.8 % (ref 38.4–76.8)
RDW: 14.7 % — ABNORMAL HIGH (ref 11.2–14.5)

## 2012-09-03 MED ORDER — ACETAMINOPHEN 325 MG PO TABS
650.0000 mg | ORAL_TABLET | Freq: Once | ORAL | Status: AC
  Administered 2012-09-03: 650 mg via ORAL

## 2012-09-03 MED ORDER — SODIUM CHLORIDE 0.9 % IV SOLN
Freq: Once | INTRAVENOUS | Status: AC
  Administered 2012-09-03: 13:00:00 via INTRAVENOUS

## 2012-09-03 MED ORDER — TRASTUZUMAB CHEMO INJECTION 440 MG
6.0000 mg/kg | Freq: Once | INTRAVENOUS | Status: AC
  Administered 2012-09-03: 294 mg via INTRAVENOUS
  Filled 2012-09-03: qty 14

## 2012-09-03 NOTE — Progress Notes (Signed)
ID: [name]   DOB: [birth year]  MR#: [id number]  JXB#:[id number]  HISTORY OF PRESENT ILLNESS: [name]'s gynecologist, Dr. Conley Simmonds, palpated a mass in the patient's left breast in 2004. The patient has a daughter, who is a Engineer, civil (consulting), formerly I believe an oncology nurse, currently a pulmonary nurse in [place], so the patient went there for her care, and while I do not have all the workup, I do have the pathology report from October 01, 2002, which was her left lumpectomy and sentinel lymph node sampling under Dr. Rexene Edison. This showed [phone]) a 2.5 cm infiltrating ductal carcinoma with lobular features (the cells were E-cadherin positive), involving one of three sentinel lymph nodes. The anterior margin was close at less than 1 mm. The patient saw Dr. Chipper Herb here, and the option of completion mastectomy versus radiotherapy alone was discussed. He felt, and I would probably have agreed, that despite the very close margin, which was nevertheless technically negative, proceeding to radiation without "clearing the margin" further would be adequate. Accordingly, the patient did receive a total of 5040 cGy with an additional 1000 cGy boost to the left breast between September 11 and February 24, 2003.  The patient then did well, but in 2007 developed some redness and bumpiness around the left areola. She brought this to Dr. Wilfred Lacy attention, and apparently an initial biopsy was negative, as was a mammogram, however, with further development of the change, the patient had a biopsy under anesthesia, and this apparently was positive (I do not have those records). With this information, she proceeded to left mastectomy on February 26, 2006. The pathology there ([id number]) showed dermal involvement by adenocarcinoma, with pagetoid spread and lymphatic space invasion. The closest margin being 1.2 cm from the inferior margin. Everything else being amply negative. There were no discrete masses or  lesions identified in the left breast. The patient subsequently underwent left mastectomy.  There were a series of further skin recurrences, first in November of 2008. Initially, these were resected, but later there were too many lesions to resect, in addition to a large left axillary lymph node. This was biopsied in January 2011, showing the tumor now to be HER-2/neu positive. Patient has been on anti-HER-2/neu treatments since January 2011, initially with trastuzumab plus lapatinib. Lapatinib was discontinued in April 2011 secondary to side effects. Continues now to receive trastuzumab on a q. 3 week basis. Also receives a zoledronic acid every 3 months.    INTERVAL HISTORY: [name] returns of with her daughter-in-law [name] for followup of her left breast cancer. She continues to receive trastuzumab 3 weeks and is due for her next dose today. She also received zoledronic acid every 3 months, last and 08/13/2012.  Interval history is remarkable for patient having developed an infectious gastritis which required hospitalization in late February and early March. She tells me she "finally got over it around [place]" and is feeling much better. She is eating intriguing well, and denies any current issues with nausea, vomiting, or diarrhea. She's had no signs of abnormal bleeding.  REVIEW OF SYSTEMS: [name] has had no recent fevers or chills.  She denies any skin changes or rashes.  She has had no new cough, shortness of breath, chest pain, or palpitations.  Infact she still walks almost 2 miles daily.  She denies any unusual headaches, dizziness, gait imbalance,or change in vision. She has chronic joint pain, chiefly lower back but really "all the joints" and she attributes that to arthritis.  It is unchanged.  She also denies any jaw pain, oral problems, or dental procedures.  A detailed review of systems today was otherwise unremarkable.   PAST MEDICAL HISTORY: Past Medical History  Diagnosis  Date  . Cancer     breast  . Cataract   . CAD (coronary artery disease)     cath w/ trivial disease  . Diverticulosis of colon   . Osteoporosis     s/p compression fx T12  . Macular degeneration   . Breast cancer     PAST SURGICAL HISTORY: Past Surgical History  Procedure Laterality Date  . Abdominal hysterectomy    . Breast lumpectomy    . Excision for paget's vulvar    . Vertbroplasty      T12  . Mastectomy      left  . Flexible sigmoidoscopy N/A 06/27/2012    Procedure: FLEXIBLE SIGMOIDOSCOPY;  Surgeon: Hart Carwin, MD;  Location: [facility];  Service: Endoscopy;  Laterality: N/A;    FAMILY HISTORY The patient's father died in an automobile accident at 29, the patient's mother from a stroke at 28. The patient had one brother who died from causes unknown to her at the age of 90, and a sister who had "lots of problems" and died at the age of 54, but there is no history of breast or ovarian cancer in the family.   GYNECOLOGIC HISTORY: She is GX P3. She took hormone replacement therapy briefly after menopause.   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 She has been a widow since 1997; lives independently, drives, and does all of her activities of daily living. Her daughter, [name], is a Engineer, civil (consulting) in [place]. Her daughter, [name], lives in [place].[name], who frequently comes with the patient to medical visits, is married to the patient's son, who is also her power of attorney. He works for the [employer]. The patient has six grandchildren and three great-grandchildren. She attends [employer].     ADVANCED DIRECTIVES: in place  HEALTH MAINTENANCE: History  Substance Use Topics  . Smoking status: Former Smoker    Quit date: 05/01/1990  . Smokeless tobacco: Never Used  . Alcohol Use: No     Colonoscopy:  PAP:  Bone density:  Lipid panel:  Allergies  Allergen  Reactions  . Solifenacin Succinate     REACTION: "sick" nausea    Current Outpatient Prescriptions  Medication Sig Dispense Refill  . acetaminophen (TYLENOL) 500 MG tablet Take 500 mg by mouth at bedtime.       . Ascorbic Acid (VITAMIN C) 100 MG tablet Take 100 mg by mouth daily.        . cholecalciferol (VITAMIN D) 1000 UNITS tablet Take 1,000 Units by mouth daily.      . clobetasol ointment (TEMOVATE) 0.05 %       . enalapril (VASOTEC) 2.5 MG tablet Take 1 tablet (2.5 mg total) by mouth daily.  30 tablet  11  . fish oil-omega-3 fatty acids 1000 MG capsule Take 2 g by mouth daily.        . metroNIDAZOLE (FLAGYL) 250 MG tablet Take 1 tablet (250 mg total) by mouth 3 (three) times daily.  12 tablet  0  . Multiple Vitamin (MULTIVITAMIN PO) Take by mouth.        . Multiple Vitamins-Minerals (ICAPS PO) Take by mouth daily.      . ranitidine (  ZANTAC) 150 MG tablet Take 1 tablet (150 mg total) by mouth 2 (two) times daily.  60 tablet  1  . saccharomyces boulardii (FLORASTOR) 250 MG capsule Take 1 capsule (250 mg total) by mouth 2 (two) times daily.  60 capsule  0  . trastuzumab (HERCEPTIN) 440 MG injection Inject into the vein once.        . zolendronic acid (ZOMETA) 4 MG/5ML injection Inject 4 mg into the vein every 3 (three) months.         No current facility-administered medications for this visit.    OBJECTIVE: Elderly white woman in no acute distress Filed Vitals:   09/03/12 1151  BP: 150/75  Pulse: 62  Temp: 97.9 F (36.6 C)  Resp: 98     Body mass index is 20.28 kg/(m^2).    ECOG FS: 1  Sclerae unicteric Oropharynx clear No cervical or supraclavicular adenopathy Lungs clear to auscultation, no wheezes, no rales or rhonchi Heart regular rate and rhythm Abdomen thin,soft, nontender, positive bowel sounds MSK no focal spinal tenderness, no peripheral edema Skin shows no evidence of disease recurrence on the left chest wall Neuro: nonfocal, well oriented, positive  affect Breasts: Right breast unremarkable, no masses, skin changes, or nipple inversion. Patient status post left mastectomy. No evidence of local recurrence. Axillae are benign bilaterally with no palpable adenopathy.   LAB RESULTS: Lab Results  Component Value Date   WBC 4.2 09/03/2012   NEUTROABS 2.6 09/03/2012   HGB 13.4 09/03/2012   HCT 41.0 09/03/2012   MCV 91.1 09/03/2012   PLT 147 09/03/2012      Chemistry      Component Value Date/Time   NA 142 08/13/2012 1140   NA 139 06/27/2012 0414   K 4.0 08/13/2012 1140   K 3.3* 06/27/2012 0414   CL 108* 08/13/2012 1140   CL 109 06/27/2012 0414   CO2 25 08/13/2012 1140   CO2 21 06/27/2012 0414   BUN 17.5 08/13/2012 1140   BUN 7 06/27/2012 0414   CREATININE 0.9 08/13/2012 1140   CREATININE 0.67 06/27/2012 0414      Component Value Date/Time   CALCIUM 9.9 08/13/2012 1140   CALCIUM 8.9 06/27/2012 0414   ALKPHOS 60 08/13/2012 1140   ALKPHOS 43 06/27/2012 0414   AST 31 08/13/2012 1140   AST 34 06/27/2012 0414   ALT 16 08/13/2012 1140   ALT 18 06/27/2012 0414   BILITOT 0.50 08/13/2012 1140   BILITOT 0.2* 06/27/2012 0414       Lab Results  Component Value Date   LABCA2 17 09/26/2011     STUDIES: Unremarkable Right mammogram at [facility] in October 2013.  Echocardiogram 06/05/2012 shows an excellent ejection fraction    ASSESSMENT: 79 y.o.  [place] woman  (1) status post left lumpectomy and sentinel lymph node dissection June of 2004 for a T2 N1 (Stage II) invasive ductal carcinoma, triple negative, status post radiation therapy completed October of 2004  (2) local recurrence around the areola October 2007, leading to mastectomy;  (3) then a series of further skin recurrences first noted November of 2008. Initially these were resected, later with too many lesions to resect; there was also a large left axillary lymph node which was biopsied January of 2011 showing the tumor now to be HER-2 positive.  (4) Since January 2011 she has been on anti-HER-2  treatment, initially with trastuzumab plus lapatinib, with a lapatinib discontinued April of 2011 because of side effects.  (5) continues to receive trastuzumab  every 3 weeks and zoledronic acid every 3 months. Most recent echo 06/05/2012 (6) Recurrence of vulvar Paget's disease, originally diagnosed and resected in 2004 and recurred in October 2013.  Followed by Dr. Huel Cote and Dr. De Blanch.  PLAN: Mrs. Hockman is doing very well with regards to her breast cancer, and there is no clinical evidence of disease activity at present. The plan is to continue every 3 week trastuzumab, which she will receive today.  She is bing scheduled for a repeat echo later this month, hopefully before her next scheduled dose on 09/24/2012.    She will continue to receive zoledronic acid every 3 months, due again in July, and she will see Dr. Darnelle Catalan at that time for follow up. She knows to call for any problems that may develop before then.  [name]    09/03/2012

## 2012-09-03 NOTE — Telephone Encounter (Signed)
appts made and printed. Pt is aware that tx will follow after seeing GCM on 11/05/12. i emailed MB to add on a tx for 7/9...td

## 2012-09-03 NOTE — Patient Instructions (Addendum)

## 2012-09-08 ENCOUNTER — Ambulatory Visit (HOSPITAL_BASED_OUTPATIENT_CLINIC_OR_DEPARTMENT_OTHER)
Admission: RE | Admit: 2012-09-08 | Discharge: 2012-09-08 | Disposition: A | Discharge status: Home / Self Care | Payer: Medicare Other | Source: Ambulatory Visit | Attending: Internal Medicine | Admitting: Internal Medicine

## 2012-09-08 ENCOUNTER — Ambulatory Visit (HOSPITAL_COMMUNITY)
Admission: RE | Admit: 2012-09-08 | Discharge: 2012-09-08 | Disposition: A | Discharge status: Home / Self Care | Payer: Medicare Other | Source: Ambulatory Visit | Attending: Internal Medicine | Admitting: Internal Medicine

## 2012-09-08 ENCOUNTER — Encounter (HOSPITAL_COMMUNITY): Payer: Self-pay

## 2012-09-08 VITALS — BP 150/82 | HR 64 | Ht 60.5 in | Wt 107.0 lb

## 2012-09-08 DIAGNOSIS — I2510 Atherosclerotic heart disease of native coronary artery without angina pectoris: Secondary | ICD-10-CM | POA: Insufficient documentation

## 2012-09-08 DIAGNOSIS — Z09 Encounter for follow-up examination after completed treatment for conditions other than malignant neoplasm: Secondary | ICD-10-CM

## 2012-09-08 DIAGNOSIS — C50919 Malignant neoplasm of unspecified site of unspecified female breast: Secondary | ICD-10-CM | POA: Insufficient documentation

## 2012-09-08 DIAGNOSIS — M810 Age-related osteoporosis without current pathological fracture: Secondary | ICD-10-CM | POA: Insufficient documentation

## 2012-09-08 DIAGNOSIS — I10 Essential (primary) hypertension: Secondary | ICD-10-CM | POA: Insufficient documentation

## 2012-09-08 DIAGNOSIS — Z79899 Other long term (current) drug therapy: Secondary | ICD-10-CM | POA: Insufficient documentation

## 2012-09-08 DIAGNOSIS — I079 Rheumatic tricuspid valve disease, unspecified: Secondary | ICD-10-CM | POA: Insufficient documentation

## 2012-09-08 DIAGNOSIS — I080 Rheumatic disorders of both mitral and aortic valves: Secondary | ICD-10-CM | POA: Insufficient documentation

## 2012-09-08 DIAGNOSIS — Z87891 Personal history of nicotine dependence: Secondary | ICD-10-CM | POA: Insufficient documentation

## 2012-09-08 NOTE — Progress Notes (Signed)
Patient ID: [name], female   DOB: 1913-09-24, 79 y.o.   MRN: [id number] Oncologist: Dr Darnelle Catalan HPI: [name] is a delightful 79 y/o woman with history of Had cath in 2000 with no significant CAD. Found to have breast CA in 2004. Initial lumpectomy in 2004. There was evidence of recurrence. And then underwent mastectomy in 2006 at [facility]. Then treated with XRT. Had recurrence in L axillary node in 2010. Apparently the initial tumor was negative for ER/PR/Her 2-neu receptors. However lymph nod HER 2-neu + though to be mutation.   Started on Tykerb and Herceptin. Treated with Tykerb x 6 months.  Continues on Herceptin q 3 weeks (3 years in at this time)  Echos  08/2010: EF 55-60% lat s' hard to see about 8 cm/s  03/2011: EF 55-60% lateral s' velocity 9.2 cm/s 07/2011: EF 55-60% lateral s' 9.3-9.5 cm/s (difficult window) 10/2011:  EF 55-60% lateral s' 8.9, 9.2 cm/s 03/20/12 EF 55-60% lateral S' 9.2 09/08/12 EF lateral S' 55-60% 11.0 cm/s  She returns for Here for routine follow up today with her son. In late February and March developed infectious gastritis which required hospitalization. Now feeling much better. She is eating  well, and denies any current issues with nausea, vomiting, or diarrhea. She's had no signs of abnormal bleeding. Denies SOB/PND/Orthopnea. She continues on Herceptin every 3 weeks.       Past Medical History  Diagnosis Date  . Cancer     breast  . Cataract   . CAD (coronary artery disease)     cath w/ trivial disease  . Diverticulosis of colon   . Osteoporosis     s/p compression fx T12  . Macular degeneration   . Breast cancer    Current Outpatient Prescriptions on File Prior to Encounter  Medication Sig Dispense Refill  . acetaminophen (TYLENOL) 500 MG tablet Take 500 mg by mouth at bedtime.       . Ascorbic Acid (VITAMIN C) 100 MG tablet Take 100 mg by mouth daily.        . cholecalciferol (VITAMIN D) 1000 UNITS tablet Take 1,000 Units by mouth daily.       . clobetasol ointment (TEMOVATE) 0.05 %       . enalapril (VASOTEC) 2.5 MG tablet Take 1 tablet (2.5 mg total) by mouth daily.  30 tablet  11  . fish oil-omega-3 fatty acids 1000 MG capsule Take 2 g by mouth daily.        . metroNIDAZOLE (FLAGYL) 250 MG tablet Take 1 tablet (250 mg total) by mouth 3 (three) times daily.  12 tablet  0  . Multiple Vitamin (MULTIVITAMIN PO) Take by mouth.        . Multiple Vitamins-Minerals (ICAPS PO) Take by mouth daily.      . ranitidine (ZANTAC) 150 MG tablet Take 1 tablet (150 mg total) by mouth 2 (two) times daily.  60 tablet  1  . saccharomyces boulardii (FLORASTOR) 250 MG capsule Take 1 capsule (250 mg total) by mouth 2 (two) times daily.  60 capsule  0  . trastuzumab (HERCEPTIN) 440 MG injection Inject into the vein once.        . zolendronic acid (ZOMETA) 4 MG/5ML injection Inject 4 mg into the vein every 3 (three) months.         No current facility-administered medications on file prior to encounter.    Review of Systems: System review is negative for any constitutional, cardiac, pulmonary, GI or neuro  symptoms or complaints other than as described in the HPI.     Objective:    There were no vitals filed for this visit. Physical Exam: General: Elderly Well appearing. No resp difficulty Son present HEENT: normal Neck: supple. JVD flat. Carotids 2+ bilat; no bruits. No lymphadenopathy or thryomegaly appreciated. Cor: PMI nondisplaced. Regular rate & rhythm. Soft diastolic murmur at RSB Lungs: clear Abdomen: soft, nontender, nondistended. No hepatosplenomegaly. No bruits or masses. Good bowel sounds. Extremities: no cyanosis, clubbing, rash, edema Neuro: alert & orientedx3, cranial nerves grossly intact. moves all 4 extremities w/o difficulty. Affect pleasant     Assessment & Plan:

## 2012-09-08 NOTE — Assessment & Plan Note (Signed)
I reviewed echos personally. EF and Doppler parameters stable. No HF on exam. Continue Herceptin.   

## 2012-09-08 NOTE — Progress Notes (Signed)
*  PRELIMINARY RESULTS* Echocardiogram 2D Echocardiogram has been performed.  [name] 09/08/2012, 4:21 PM

## 2012-09-08 NOTE — Patient Instructions (Addendum)
Follow up in 3 months with an ECHO 

## 2012-09-24 ENCOUNTER — Ambulatory Visit: Payer: Medicare Other | Attending: Gynecology | Admitting: Gynecology

## 2012-09-24 ENCOUNTER — Other Ambulatory Visit (HOSPITAL_BASED_OUTPATIENT_CLINIC_OR_DEPARTMENT_OTHER): Payer: Medicare Other | Admitting: Lab

## 2012-09-24 ENCOUNTER — Encounter: Payer: Self-pay | Admitting: Gynecology

## 2012-09-24 ENCOUNTER — Ambulatory Visit (HOSPITAL_BASED_OUTPATIENT_CLINIC_OR_DEPARTMENT_OTHER): Payer: Medicare Other

## 2012-09-24 VITALS — BP 158/80 | HR 64 | Temp 97.6°F | Resp 16 | Ht 61.46 in | Wt 107.0 lb

## 2012-09-24 DIAGNOSIS — C50519 Malignant neoplasm of lower-outer quadrant of unspecified female breast: Secondary | ICD-10-CM

## 2012-09-24 DIAGNOSIS — M889 Osteitis deformans of unspecified bone: Secondary | ICD-10-CM

## 2012-09-24 DIAGNOSIS — C50919 Malignant neoplasm of unspecified site of unspecified female breast: Secondary | ICD-10-CM

## 2012-09-24 DIAGNOSIS — Z853 Personal history of malignant neoplasm of breast: Secondary | ICD-10-CM | POA: Insufficient documentation

## 2012-09-24 DIAGNOSIS — C519 Malignant neoplasm of vulva, unspecified: Secondary | ICD-10-CM | POA: Insufficient documentation

## 2012-09-24 DIAGNOSIS — Z79899 Other long term (current) drug therapy: Secondary | ICD-10-CM | POA: Insufficient documentation

## 2012-09-24 DIAGNOSIS — Z5112 Encounter for antineoplastic immunotherapy: Secondary | ICD-10-CM

## 2012-09-24 LAB — COMPREHENSIVE METABOLIC PANEL (CC13)
CO2: 25 mEq/L (ref 22–29)
Calcium: 9.7 mg/dL (ref 8.4–10.4)
Creatinine: 0.8 mg/dL (ref 0.6–1.1)
Glucose: 94 mg/dl (ref 70–99)
Sodium: 142 mEq/L (ref 136–145)
Total Bilirubin: 0.52 mg/dL (ref 0.20–1.20)
Total Protein: 7 g/dL (ref 6.4–8.3)

## 2012-09-24 LAB — CBC WITH DIFFERENTIAL/PLATELET
Basophils Absolute: 0 10*3/uL (ref 0.0–0.1)
Eosinophils Absolute: 0.1 10*3/uL (ref 0.0–0.5)
HCT: 42.2 % (ref 34.8–46.6)
HGB: 13.6 g/dL (ref 11.6–15.9)
LYMPH%: 22.9 % (ref 14.0–49.7)
MCV: 91.3 fL (ref 79.5–101.0)
MONO%: 9.7 % (ref 0.0–14.0)
NEUT#: 2.6 10*3/uL (ref 1.5–6.5)
Platelets: 151 10*3/uL (ref 145–400)

## 2012-09-24 MED ORDER — TRASTUZUMAB CHEMO INJECTION 440 MG
6.0000 mg/kg | Freq: Once | INTRAVENOUS | Status: AC
  Administered 2012-09-24: 294 mg via INTRAVENOUS
  Filled 2012-09-24: qty 14

## 2012-09-24 MED ORDER — DIPHENHYDRAMINE HCL 25 MG PO CAPS: 25.0000 mg | ORAL_CAPSULE | Freq: Once | ORAL | Status: DC

## 2012-09-24 MED ORDER — SODIUM CHLORIDE 0.9 % IV SOLN
Freq: Once | INTRAVENOUS | Status: AC
  Administered 2012-09-24: 13:00:00 via INTRAVENOUS

## 2012-09-24 MED ORDER — ACETAMINOPHEN 325 MG PO TABS
650.0000 mg | ORAL_TABLET | Freq: Once | ORAL | Status: AC
  Administered 2012-09-24: 650 mg via ORAL

## 2012-09-24 NOTE — Patient Instructions (Signed)
Continue using clobetasol but try to reduce it to applying once a day and then maybe every other day.  Return to see me in one year or as needed.

## 2012-09-24 NOTE — Progress Notes (Signed)
Consult Note: Gyn-Onc   [name] 79 y.o. female  Chief Complaint  Patient presents with  . Paget's    Follow-up    Assessment : Paget's disease of the vulva which is unchanged on clinical examination. The patient is very happy with clobetasol which is relieving all of her symptoms.  Plan: Continue clobetasol but attempt to reduce the frequency of use to when necessary. Return to see me in one year or as needed.  Interval History: Since her initial visit November 2013 the patient is been using clobetasol twice a day. She reports her symptoms are nearly completely resolved. She has no new vulvar lesions.  HPI:79 year old white female initially seen in consultation request of Dr. Huel Cote regarding management of recurrent vulvar Paget's disease. The patient's history dates back to at least 2004 where she had symptomatic vulvar Paget's disease. She's had 2 surgical resections of lesions.  Recently she has noted more irritation of the left vulva. She saw Dr. Senaida Ores on October 21 , 2013 who obtained a biopsy of the left vulvar lesion showing recurrent Paget's disease. The patient's symptoms at the present time seem relatively mild other she describes this as "irritation". She denies any bleeding or any other vulvar pain.  At our initial visit November 2013 we instituted a medical management regimen using clobetasol which resulted in good relief of her symptoms.   Review of Systems:10 point review of systems is negative except as noted in interval history.   Vitals: Blood pressure 158/80, pulse 64, temperature 97.6 F (36.4 C), temperature source Oral, resp. rate 16, height 5' 1.46" (1.561 m), weight 107 lb (48.535 kg).  Physical Exam: General : The patient is a healthy woman in no acute distress.  HEENT: normocephalic, extraoccular movements normal; neck is supple without thyromegally  Lynphnodes: Supraclavicular and inguinal nodes not enlarged  Abdomen: Soft,  non-tender, no ascites, no organomegally, no masses, no hernias  Pelvic:  EGBUS: Normal female . There is a 4 x 4 centimeter area of erythematous skin on the left anterior vulva which is unchanged from prior visit. Vagina: Normal, no lesions atrophic Urethra and Bladder: Normal, non-tender  Cervix: Surgically absent  Uterus: Surgically absent  Bi-manual examination: Non-tender; no adenxal masses or nodularity  Rectal: normal sphincter tone, no masses, no blood  Lower extremities: No edema or varicosities. Normal range of motion      Allergies  Allergen Reactions  . Solifenacin Succinate     REACTION: "sick" nausea    Past Medical History  Diagnosis Date  . Cancer     breast  . Cataract   . CAD (coronary artery disease)     cath w/ trivial disease  . Diverticulosis of colon   . Osteoporosis     s/p compression fx T12  . Macular degeneration   . Breast cancer     Past Surgical History  Procedure Laterality Date  . Abdominal hysterectomy    . Breast lumpectomy    . Excision for paget's vulvar    . Vertbroplasty      T12  . Mastectomy      left  . Flexible sigmoidoscopy N/A 06/27/2012    Procedure: FLEXIBLE SIGMOIDOSCOPY;  Surgeon: Hart Carwin, MD;  Location: [facility];  Service: Endoscopy;  Laterality: N/A;    Current Outpatient Prescriptions  Medication Sig Dispense Refill  . acetaminophen (TYLENOL) 500 MG tablet Take 500 mg by mouth at bedtime.       . Ascorbic Acid (VITAMIN C) 100  MG tablet Take 100 mg by mouth daily.        . cholecalciferol (VITAMIN D) 1000 UNITS tablet Take 1,000 Units by mouth daily.      . clobetasol ointment (TEMOVATE) 0.05 %       . enalapril (VASOTEC) 2.5 MG tablet Take 1 tablet (2.5 mg total) by mouth daily.  30 tablet  11  . fish oil-omega-3 fatty acids 1000 MG capsule Take 2 g by mouth daily.        . Multiple Vitamin (MULTIVITAMIN PO) Take by mouth.        . Multiple Vitamins-Minerals (ICAPS PO) Take by mouth daily.      .  trastuzumab (HERCEPTIN) 440 MG injection Inject into the vein once.        . zolendronic acid (ZOMETA) 4 MG/5ML injection Inject 4 mg into the vein every 3 (three) months.         No current facility-administered medications for this visit.    History   Social History  . Marital Status: Widowed    Spouse Name: N/A    Number of Children: N/A  . Years of Education: N/A   Occupational History  . Not on file.   Social History Main Topics  . Smoking status: Former Smoker    Quit date: 05/01/1990  . Smokeless tobacco: Never Used  . Alcohol Use: No  . Drug Use: No  . Sexually Active: No   Other Topics Concern  . Not on file   Social History Narrative   HS[employer], no college. Married '44- '97 widowed. 2 dtrs - '46, '54, 1 son - '49; 6 grandchildren; 5 great-grands. 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 She lives alone.    End of life care: Do not resuscitate; willing to have intensive care excluding mechanical ventilation.     Family History  Problem Relation Age of Onset  . Cancer Sister       Jeannette Corpus, MD 09/24/2012, 11:17 AM

## 2012-09-24 NOTE — Patient Instructions (Signed)

## 2012-09-26 NOTE — Progress Notes (Signed)
OPENED IN ERROR

## 2012-10-02 ENCOUNTER — Encounter (HOSPITAL_COMMUNITY): Payer: Medicare Other

## 2012-10-02 ENCOUNTER — Other Ambulatory Visit (HOSPITAL_COMMUNITY): Payer: Medicare Other

## 2012-10-15 ENCOUNTER — Ambulatory Visit (HOSPITAL_BASED_OUTPATIENT_CLINIC_OR_DEPARTMENT_OTHER): Payer: Medicare Other

## 2012-10-15 ENCOUNTER — Other Ambulatory Visit (HOSPITAL_BASED_OUTPATIENT_CLINIC_OR_DEPARTMENT_OTHER): Payer: Medicare Other | Admitting: Lab

## 2012-10-15 VITALS — BP 153/77 | HR 72 | Temp 98.7°F | Resp 20

## 2012-10-15 DIAGNOSIS — C50519 Malignant neoplasm of lower-outer quadrant of unspecified female breast: Secondary | ICD-10-CM

## 2012-10-15 DIAGNOSIS — Z5112 Encounter for antineoplastic immunotherapy: Secondary | ICD-10-CM

## 2012-10-15 DIAGNOSIS — C50919 Malignant neoplasm of unspecified site of unspecified female breast: Secondary | ICD-10-CM

## 2012-10-15 LAB — COMPREHENSIVE METABOLIC PANEL (CC13)
ALT: 17 U/L (ref 0–55)
Albumin: 4.1 g/dL (ref 3.5–5.0)
Alkaline Phosphatase: 55 U/L (ref 40–150)
Potassium: 4.1 mEq/L (ref 3.5–5.1)
Sodium: 141 mEq/L (ref 136–145)
Total Bilirubin: 0.67 mg/dL (ref 0.20–1.20)
Total Protein: 7.6 g/dL (ref 6.4–8.3)

## 2012-10-15 LAB — CBC WITH DIFFERENTIAL/PLATELET
EOS%: 3.5 % (ref 0.0–7.0)
Eosinophils Absolute: 0.2 10*3/uL (ref 0.0–0.5)
HGB: 14.1 g/dL (ref 11.6–15.9)
MCH: 30 pg (ref 25.1–34.0)
MCV: 90.6 fL (ref 79.5–101.0)
MONO%: 10.4 % (ref 0.0–14.0)
NEUT#: 2.8 10*3/uL (ref 1.5–6.5)
RBC: 4.7 10*6/uL (ref 3.70–5.45)
RDW: 14.4 % (ref 11.2–14.5)
lymph#: 1.1 10*3/uL (ref 0.9–3.3)
nRBC: 0 % (ref 0–0)

## 2012-10-15 MED ORDER — TRASTUZUMAB CHEMO INJECTION 440 MG
6.0000 mg/kg | Freq: Once | INTRAVENOUS | Status: AC
  Administered 2012-10-15: 294 mg via INTRAVENOUS
  Filled 2012-10-15: qty 14

## 2012-10-15 MED ORDER — HEPARIN SOD (PORK) LOCK FLUSH 100 UNIT/ML IV SOLN
500.0000 [IU] | Freq: Once | INTRAVENOUS | Status: DC | PRN
  Filled 2012-10-15: qty 5

## 2012-10-15 MED ORDER — ACETAMINOPHEN 325 MG PO TABS
650.0000 mg | ORAL_TABLET | Freq: Once | ORAL | Status: AC
  Administered 2012-10-15: 650 mg via ORAL

## 2012-10-15 MED ORDER — SODIUM CHLORIDE 0.9 % IJ SOLN
10.0000 mL | INTRAMUSCULAR | Status: DC | PRN
  Filled 2012-10-15: qty 10

## 2012-10-15 MED ORDER — SODIUM CHLORIDE 0.9 % IV SOLN
Freq: Once | INTRAVENOUS | Status: AC
  Administered 2012-10-15: 12:00:00 via INTRAVENOUS

## 2012-10-15 NOTE — Patient Instructions (Addendum)
[facility] Discharge Instructions for Patients Receiving Chemotherapy  Today you received the following chemotherapy agents Herceptin.  To help prevent nausea and vomiting after your treatment, we encourage you to take your nausea medication as prescribed.   If you develop nausea and vomiting that is not controlled by your nausea medication, call the clinic.   BELOW ARE SYMPTOMS THAT SHOULD BE REPORTED IMMEDIATELY:  *FEVER GREATER THAN 100.5 F  *CHILLS WITH OR WITHOUT FEVER  NAUSEA AND VOMITING THAT IS NOT CONTROLLED WITH YOUR NAUSEA MEDICATION  *UNUSUAL SHORTNESS OF BREATH  *UNUSUAL BRUISING OR BLEEDING  TENDERNESS IN MOUTH AND THROAT WITH OR WITHOUT PRESENCE OF ULCERS  *URINARY PROBLEMS  *BOWEL PROBLEMS  UNUSUAL RASH Items with * indicate a potential emergency and should be followed up as soon as possible.  Feel free to call the clinic you have any questions or concerns. The clinic phone number is [phone].    

## 2012-11-05 ENCOUNTER — Ambulatory Visit (HOSPITAL_BASED_OUTPATIENT_CLINIC_OR_DEPARTMENT_OTHER): Payer: Medicare Other

## 2012-11-05 ENCOUNTER — Other Ambulatory Visit (HOSPITAL_BASED_OUTPATIENT_CLINIC_OR_DEPARTMENT_OTHER): Payer: Medicare Other | Admitting: Lab

## 2012-11-05 ENCOUNTER — Encounter: Payer: Self-pay | Admitting: Family

## 2012-11-05 ENCOUNTER — Telehealth: Payer: Self-pay | Admitting: *Deleted

## 2012-11-05 ENCOUNTER — Ambulatory Visit (HOSPITAL_BASED_OUTPATIENT_CLINIC_OR_DEPARTMENT_OTHER): Payer: Medicare Other | Admitting: Family

## 2012-11-05 ENCOUNTER — Ambulatory Visit: Payer: Medicare Other | Admitting: Oncology

## 2012-11-05 VITALS — BP 161/81 | HR 61 | Temp 97.6°F | Resp 20 | Ht 61.46 in | Wt 104.8 lb

## 2012-11-05 DIAGNOSIS — C50919 Malignant neoplasm of unspecified site of unspecified female breast: Secondary | ICD-10-CM

## 2012-11-05 DIAGNOSIS — C50912 Malignant neoplasm of unspecified site of left female breast: Secondary | ICD-10-CM

## 2012-11-05 DIAGNOSIS — Z5112 Encounter for antineoplastic immunotherapy: Secondary | ICD-10-CM

## 2012-11-05 DIAGNOSIS — M810 Age-related osteoporosis without current pathological fracture: Secondary | ICD-10-CM

## 2012-11-05 DIAGNOSIS — C773 Secondary and unspecified malignant neoplasm of axilla and upper limb lymph nodes: Secondary | ICD-10-CM

## 2012-11-05 DIAGNOSIS — C44599 Other specified malignant neoplasm of skin of other part of trunk: Secondary | ICD-10-CM

## 2012-11-05 DIAGNOSIS — R252 Cramp and spasm: Secondary | ICD-10-CM

## 2012-11-05 LAB — COMPREHENSIVE METABOLIC PANEL (CC13)
AST: 40 U/L — ABNORMAL HIGH (ref 5–34)
Alkaline Phosphatase: 60 U/L (ref 40–150)
Glucose: 86 mg/dl (ref 70–140)
Sodium: 142 mEq/L (ref 136–145)
Total Bilirubin: 0.65 mg/dL (ref 0.20–1.20)
Total Protein: 8.2 g/dL (ref 6.4–8.3)

## 2012-11-05 LAB — CBC WITH DIFFERENTIAL/PLATELET
BASO%: 0.4 % (ref 0.0–2.0)
Basophils Absolute: 0 10*3/uL (ref 0.0–0.1)
EOS%: 4 % (ref 0.0–7.0)
HGB: 14.7 g/dL (ref 11.6–15.9)
MCH: 30.2 pg (ref 25.1–34.0)
MCHC: 32.7 g/dL (ref 31.5–36.0)
MCV: 92.6 fL (ref 79.5–101.0)
MONO%: 10.2 % (ref 0.0–14.0)
RBC: 4.86 10*6/uL (ref 3.70–5.45)
RDW: 14.5 % (ref 11.2–14.5)
lymph#: 1.1 10*3/uL (ref 0.9–3.3)

## 2012-11-05 MED ORDER — ACETAMINOPHEN 325 MG PO TABS
650.0000 mg | ORAL_TABLET | Freq: Once | ORAL | Status: AC
  Administered 2012-11-05: 650 mg via ORAL

## 2012-11-05 MED ORDER — ZOLEDRONIC ACID 4 MG/5ML IV CONC
3.0000 mg | Freq: Once | INTRAVENOUS | Status: AC
  Administered 2012-11-05: 3 mg via INTRAVENOUS
  Filled 2012-11-05: qty 3.75

## 2012-11-05 MED ORDER — TRASTUZUMAB CHEMO INJECTION 440 MG
6.0000 mg/kg | Freq: Once | INTRAVENOUS | Status: AC
  Administered 2012-11-05: 294 mg via INTRAVENOUS
  Filled 2012-11-05: qty 14

## 2012-11-05 MED ORDER — SODIUM CHLORIDE 0.9 % IV SOLN
Freq: Once | INTRAVENOUS | Status: AC
  Administered 2012-11-05: 13:00:00 via INTRAVENOUS

## 2012-11-05 NOTE — Patient Instructions (Signed)
Please contact us at [phone] if you have any questions or concerns.  Please continue to do well and enjoy life!!!  Get plenty of rest, drink plenty of water, exercise daily (walking), eat a balanced diet.  Consider contacting:   [facility] [address] [place], [place] [address] Office: [phone] FAX: [phone]-fax TTY: [phone]

## 2012-11-05 NOTE — Telephone Encounter (Signed)
appts made and printed...td 

## 2012-11-05 NOTE — Telephone Encounter (Signed)
Per staff phone call and POF I have schedueld appts.  JMW  

## 2012-11-05 NOTE — Progress Notes (Signed)
[facility]  [phone] [phone]  OFFICE PROGRESS NOTE   ID: RASHONDA WARRIOR   DOB: 1945-08-27  MR#: [id number]  ION#:[id number]  Cc:  Illene Regulus, MD   HISTORY OF PRESENT ILLNESS: Mrs. Wandel's gynecologist, Dr. Conley Simmonds, palpated a mass in the patient's left breast in 2004. The patient has a daughter, who is a Engineer, civil (consulting), formerly I believe an oncology nurse, currently a pulmonary nurse in [place], so the patient went there for her care, and while I do not have all the workup, I do have the pathology report from October 01, 2002, which was her left lumpectomy and sentinel lymph node sampling under Dr. Rexene Edison. This showed [phone]) a 2.5 cm infiltrating ductal carcinoma with lobular features (the cells were E-cadherin positive), involving one of three sentinel lymph nodes. The anterior margin was close at less than 1 mm. The patient saw Dr. Chipper Herb here, and the option of completion mastectomy versus radiotherapy alone was discussed. He felt, and I would probably have agreed, that despite the very close margin, which was nevertheless technically negative, proceeding to radiation without "clearing the margin" further would be adequate. Accordingly, the patient did receive a total of 5040 cGy with an additional 1000 cGy boost to the left breast between September 11 and February 24, 2003.   The patient then did well, but in 2007 developed some redness and bumpiness around the left areola. She brought this to Dr. Wilfred Lacy attention, and apparently an initial biopsy was negative, as was a mammogram, however, with further development of the change, the patient had a biopsy under anesthesia, and this apparently was positive (I do not have those records). With this information, she proceeded to left mastectomy on February 26, 2006. The pathology there ([id number]) showed dermal involvement by adenocarcinoma, with pagetoid spread and lymphatic space invasion.  The closest margin being 1.2 cm from the inferior margin. Everything else being amply negative. There were no discrete masses or lesions identified in the left breast. The patient subsequently underwent left mastectomy.   There were a series of further skin recurrences, first in November of 2008. Initially, these were resected, but later there were too many lesions to resect, in addition to a large left axillary lymph node. This was biopsied in January 2011, showing the tumor now to be HER-2/neu positive. Patient has been on anti-HER-2/neu treatments since January 2011, initially with trastuzumab plus lapatinib. Lapatinib was discontinued in April 2011 secondary to side effects. Continues now to receive trastuzumab on a q. 3 week basis. Also receives a zoledronic acid every 3 months.    INTERVAL HISTORY: The patient returns of with her daughter-in-law [name] for followup of her breast cancer.  Since her last office visit on 77/03/2013 her interval history is unremarkable. She has been seen by Dr. Charlotte Sanes, Opthalmologist for her macular degeneration.  She keeps active by attending church and walking for exercise.   REVIEW OF SYSTEMS: She denies any unusual headaches, dizziness, gait imbalance, nausea, or vomiting. She has chronic joint pain, chiefly lower back but really "all the joints", this not being more intense or constant than before. she also reports numbness/tingling in her feet only at night that has been ongoing for over a year.  She is tolerating Herceptin with no side effects it she is aware of.  A detailed review of systems today was otherwise unremarkable.   PAST MEDICAL HISTORY: Past Medical History  Diagnosis Date  . Cancer     breast  .  Cataract   . CAD (coronary artery disease)     cath w/ trivial disease  . Diverticulosis of colon   . Osteoporosis     s/p compression fx T12  . Macular degeneration   . Breast cancer     PAST SURGICAL HISTORY: Past Surgical History   Procedure Laterality Date  . Abdominal hysterectomy    . Breast lumpectomy    . Excision for paget's vulvar    . Vertbroplasty      T12  . Mastectomy      left  . Flexible sigmoidoscopy N/A 06/27/2012    Procedure: FLEXIBLE SIGMOIDOSCOPY;  Surgeon: Hart Carwin, MD;  Location: [facility];  Service: Endoscopy;  Laterality: N/A;    FAMILY HISTORY The patient's father died in an automobile accident at 71, the patient's mother from a stroke at 77. The patient had one brother who died from causes unknown to her at the age of 77, and a sister who had "lots of problems" and died at the age of 77, but there is no history of breast or ovarian cancer in the family.   GYNECOLOGIC HISTORY: She is GX P3. She took hormone replacement therapy briefly after menopause.   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 She has been a widow since 1997; lives independently, drives, and does all of her activities of daily living. Her daughter, [name], is a Engineer, civil (consulting) in [place]. Her daughter, [name], lives in [place].[name], who frequently comes with the patient to medical visits, is married to the patient's son, who is also her power of attorney. He works for the [employer]. The patient has six grandchildren and three great-grandchildren. She attends [employer].    ADVANCED DIRECTIVES: In place  HEALTH MAINTENANCE: History  Substance Use Topics  . Smoking status: Former Smoker    Quit date: 77/05/1990  . Smokeless tobacco: Never Used  . Alcohol Use: No     Colonoscopy: 02/06/2002  PAP: Not on file  Bone density: Not on file  Lipid panel: Not on file  Allergies  Allergen Reactions  . Solifenacin Succinate     REACTION: "sick" nausea    Current Outpatient Prescriptions  Medication Sig Dispense Refill  . acetaminophen (TYLENOL) 500 MG tablet Take 500 mg by mouth daily as needed.       . Ascorbic  Acid (VITAMIN C) 100 MG tablet Take 100 mg by mouth daily.        . cholecalciferol (VITAMIN D) 1000 UNITS tablet Take 1,000 Units by mouth daily.      . clobetasol ointment (TEMOVATE) 0.05 % Apply 1 application topically as needed.       . enalapril (VASOTEC) 2.5 MG tablet Take 1 tablet (2.5 mg total) by mouth daily.  30 tablet  11  . fish oil-omega-3 fatty acids 1000 MG capsule Take 2 g by mouth daily.        . Multiple Vitamin (MULTIVITAMIN PO) Take 1 tablet by mouth daily.       . Multiple Vitamins-Minerals (ICAPS PO) Take 1 tablet by mouth 2 (two) times daily.       . trastuzumab (HERCEPTIN) 440 MG injection Inject into the vein once.        . zolendronic acid (ZOMETA) 4 MG/5ML injection Inject 4 mg into the vein every 3 (three) months.         No current facility-administered medications for  this visit.    OBJECTIVE: Elderly white woman in no acute distress Filed Vitals:   11/05/12 1052  BP: 161/81  Pulse: 61  Temp: 97.6 F (36.4 C)  Resp: 20     Body mass index is 19.51 kg/(m^2).    ECOG FS: 1   General appearance: Alert, cooperative, thin frame, no apparent distress Head: Normocephalic, without obvious abnormality, atraumatic Eyes: Macular degeneration bilaterally, arcus senilis, PERRLA, EOMI Nose: Nares, septum and mucosa are normal, no drainage or sinus tenderness Neck: No adenopathy, supple, symmetrical, trachea midline, no tenderness Resp: Clear to auscultation bilaterally, diminished bibasilar breath sounds Cardio: Regular rate and rhythm, S1, S2 normal, no murmur, click, rub or gallop Breasts:  Deferred (patient declined breast exam) GI: Soft, not distended, non-tender, normoactive bowel sounds, no organomegaly Extremities: Extremities normal, atraumatic, no cyanosis or edema, bilateral lower extremity varicose veins Lymph nodes: Cervical and supraclavicular lymph nodes are normal Neurologic: Grossly normal   LAB RESULTS: Lab Results  Component Value Date   WBC  5.0 11/05/2012   NEUTROABS 3.2 11/05/2012   HGB 14.7 11/05/2012   HCT 45.0 11/05/2012   MCV 92.6 11/05/2012   PLT 129* 11/05/2012      Chemistry      Component Value Date/Time   NA 141 10/15/2012 1136   NA 139 06/27/2012 0414   K 4.1 10/15/2012 1136   K 3.3* 06/27/2012 0414   CL 106 10/15/2012 1136   CL 109 06/27/2012 0414   CO2 25 10/15/2012 1136   CO2 21 06/27/2012 0414   BUN 20.7 10/15/2012 1136   BUN 7 06/27/2012 0414   CREATININE 1.0 10/15/2012 1136   CREATININE 0.67 06/27/2012 0414      Component Value Date/Time   CALCIUM 10.9* 10/15/2012 1136   CALCIUM 8.9 06/27/2012 0414   ALKPHOS 55 10/15/2012 1136   ALKPHOS 43 06/27/2012 0414   AST 34 10/15/2012 1136   AST 34 06/27/2012 0414   ALT 17 10/15/2012 1136   ALT 18 06/27/2012 0414   BILITOT 0.67 10/15/2012 1136   BILITOT 0.2* 06/27/2012 0414       Lab Results  Component Value Date   LABCA2 17 09/26/2011     STUDIES: 1.  Unremarkable Right mammogram at [facility] in 01/2012.  2.  Echocardiogram 09/08/2012 shows an excellent ejection fraction.    ASSESSMENT: Ms. Blackham is a 79 y.o.  [place], [place] woman:  (1) Status post left lumpectomy and sentinel lymph node dissection in 09/2002 for a T2 N1 (Stage II) invasive ductal carcinoma, triple negative, status post radiation therapy completed in 01/2003.  (2) Local recurrence around the areola 01/2006, leading to mastectomy.  (3) A series of further skin recurrences first noted in 03/2007. Initially these were resected, later with too many lesions to resect; there was also a large left axillary lymph node which was biopsied in 04/2009 showing the tumor now to be HER-2 positive.   (4) Since 04/2009 she has been on anti-HER-2 treatment, initially with trastuzumab plus lapatinib, with a lapatinib discontinued 07/2009 because of side effects.   (5) Recurrence of vulvar Paget's disease, originally diagnosed and resected in 2004 and recurred in 02/2012.  Followed by Dr. Huel Cote and Dr.  De Blanch.  (6) Continues to receive trastuzumab every 3 weeks and zoledronic acid every 3 months. Most recent echo 09/08/2012.  (7) Nocturnal leg cramping  PLAN: Mrs. Prevo is doing well from a breast cancer point of view, and there is no evidence of disease activity. The  plan is to continue every 3 week trastuzumab infusions, and every three-month zoledronic acid infusions.  We plan to see her again in 3 months (01/2013). She will be scheduled to receive an echocardiogram in 11/2012 with Dr. Gala Romney.  A vitamin B complex and magnesium 400 mg every other day was suggested for her the patient's nocturnal leg cramping.  All questions answered.  Ms. Dicola and her daughter-in-law [name] were encouraged to contact us in the interim with any questions, concerns, or problems.  Larina Bras, NP-C 11/05/2012 12:21 PM

## 2012-11-19 ENCOUNTER — Telehealth: Payer: Self-pay

## 2012-11-19 NOTE — Telephone Encounter (Signed)
Phone call from patient requesting to be squeezed in today or tomorrow due to back pain she's been having. Please advise.

## 2012-11-19 NOTE — Telephone Encounter (Signed)
Ok to add on Thursday

## 2012-11-20 ENCOUNTER — Encounter: Payer: Self-pay | Admitting: Internal Medicine

## 2012-11-20 ENCOUNTER — Ambulatory Visit (INDEPENDENT_AMBULATORY_CARE_PROVIDER_SITE_OTHER)
Admission: RE | Admit: 2012-11-20 | Discharge: 2012-11-20 | Disposition: A | Discharge status: Home / Self Care | Payer: Medicare Other | Source: Ambulatory Visit | Attending: Internal Medicine | Admitting: Internal Medicine

## 2012-11-20 ENCOUNTER — Ambulatory Visit (INDEPENDENT_AMBULATORY_CARE_PROVIDER_SITE_OTHER): Payer: Medicare Other | Admitting: Internal Medicine

## 2012-11-20 VITALS — BP 158/90 | HR 58 | Temp 97.5°F | Wt 105.4 lb

## 2012-11-20 DIAGNOSIS — M549 Dorsalgia, unspecified: Secondary | ICD-10-CM

## 2012-11-20 MED ORDER — TRAMADOL HCL 50 MG PO TABS: 50.0000 mg | ORAL_TABLET | Freq: Three times a day (TID) | ORAL | Status: DC | PRN

## 2012-11-20 NOTE — Telephone Encounter (Signed)
Pt has an appt today 11/20/12 at 11/per pt request.

## 2012-11-20 NOTE — Patient Instructions (Addendum)
Sudden on-set of back pain with radiation suggests a spontaneous compression fracture of the lower thoracic or upper lumbar vertebra.  Plan X-rays today.  If compression fracture you may need MRI and possibly kyphoplasty - cement injection  For pain - ok to use tylenol 1,000 mg three times a day. If something stronger is needed fill the Rx for tramadol 50 mg every 6 hours as needed.

## 2012-11-20 NOTE — Telephone Encounter (Signed)
Per Dr Debby Bud please schedule patient to be seen today for back pain, thanks.

## 2012-11-20 NOTE — Progress Notes (Signed)
Subjective:    Patient ID: [name], female    DOB: 22-May-1913, 79 y.o.   MRN: [id number]  HPI [name] has had mild ache in her mid-back for weeks. Yesterday she had sudden on-set of pain in the right mid-back with radiation to the right anterior chest. It hurts to breath or talk or move. She has not rash, no cough, no fever.   Past Medical History  Diagnosis Date  . Cancer     breast  . Cataract   . CAD (coronary artery disease)     cath w/ trivial disease  . Diverticulosis of colon   . Osteoporosis     s/p compression fx T12  . Macular degeneration   . Breast cancer    Past Surgical History  Procedure Laterality Date  . Abdominal hysterectomy    . Breast lumpectomy    . Excision for paget's vulvar    . Vertbroplasty      T12  . Mastectomy      left  . Flexible sigmoidoscopy N/A 06/27/2012    Procedure: FLEXIBLE SIGMOIDOSCOPY;  Surgeon: Hart Carwin, MD;  Location: [facility];  Service: Endoscopy;  Laterality: N/A;   Family History  Problem Relation Age of Onset  . Cancer Sister   . Hypertension Mother    History   Social History  . Marital Status: Widowed    Spouse Name: N/A    Number of Children: N/A  . Years of Education: N/A   Occupational History  . Not on file.   Social History Main Topics  . Smoking status: Former Smoker    Quit date: 05/01/1990  . Smokeless tobacco: Never Used  . Alcohol Use: No  . Drug Use: No  . Sexually Active: No   Other Topics Concern  . Not on file   Social History Narrative   HSG, no college. Married '44- '97 widowed. 2 dtrs - '46, '54, 1 son - '49; 6 grandchildren; 5 great-grands. 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 She lives alone.    End of life care: Do not resuscitate; willing to have intensive care excluding mechanical ventilation.     Current Outpatient Prescriptions on File Prior to Visit  Medication Sig Dispense Refill  . acetaminophen (TYLENOL) 500 MG tablet Take  500 mg by mouth daily as needed.       . Ascorbic Acid (VITAMIN C) 100 MG tablet Take 100 mg by mouth daily.        . cholecalciferol (VITAMIN D) 1000 UNITS tablet Take 1,000 Units by mouth daily.      . clobetasol ointment (TEMOVATE) 0.05 % Apply 1 application topically as needed.       . enalapril (VASOTEC) 2.5 MG tablet Take 1 tablet (2.5 mg total) by mouth daily.  30 tablet  11  . fish oil-omega-3 fatty acids 1000 MG capsule Take 2 g by mouth daily.        . Multiple Vitamin (MULTIVITAMIN PO) Take 1 tablet by mouth daily.       . Multiple Vitamins-Minerals (ICAPS PO) Take 1 tablet by mouth 2 (two) times daily.       . trastuzumab (HERCEPTIN) 440 MG injection Inject into the vein once.        . zolendronic acid (ZOMETA) 4 MG/5ML injection Inject 4 mg into the vein every 3 (three) months.         No current facility-administered medications on file prior to visit.  Review of Systems System review is negative for any constitutional, cardiac, pulmonary, GI or neuro symptoms or complaints other than as described in the HPI.     Objective:   Physical Exam Filed Vitals:   11/20/12 1137  BP: 158/90  Pulse: 58  Temp: 97.5 F (36.4 C)   Wt Readings from Last 3 Encounters:  11/20/12 105 lb 6.4 oz (47.809 kg)  11/05/12 104 lb 12.8 oz (47.537 kg)  09/24/12 107 lb (48.535 kg)   Gen'l- elderly white woman in no acute distress but very uncomfortable with movement or deep inspiration HEENT_ C&S clear, [place]/AT Neck - supple Chest - kyphosis noted. Very tender to palpation at the thoracic spine, very tender to palpation at the right chest wall posterior axillary line. No palpable deformity Pulm - normal shallow respirations Cor - RRR Neuro - A&O x 3  CXR: IMPRESSION:  Slight cardiac silhouette enlargement. Scoliosis. Osteopenic  appearance of bones. Slight hyperinflation configuration. No  pulmonary edema, pneumonia, or pleural effusion. Chronic bony  findings detailed  above.   Thoracic spine films: IMPRESSION:  Stable scoliosis. Osteopenic appearance of bones. Chronic changes  of degenerative disc disease and degenerative spondylosis. Chronic  lower thoracic vertebral body compression fracture has undergone  previous vertebroplasty procedure appears unchanged. Slight  decreased height of a lower mid thoracic vertebral body appears  unchanged. No definite new compression fracture is evident.       Assessment & Plan:  Severe back pain - no evidence of compression fracture or acute injury, no evidence of rib fracture. Suspect chest wall strain.  Plan lidoderm patch ordered.

## 2012-11-21 ENCOUNTER — Telehealth: Payer: Self-pay

## 2012-11-21 ENCOUNTER — Telehealth: Payer: Self-pay | Admitting: *Deleted

## 2012-11-21 MED ORDER — LIDOCAINE 5 % EX PTCH: MEDICATED_PATCH | CUTANEOUS | Status: DC

## 2012-11-21 NOTE — Telephone Encounter (Signed)
Per Dr Debby Bud Lidoderm Patch is to be called to patients pharmacy ([facility] on [address]) to apply to affected area on 12 hours off 12 hours. This was done.

## 2012-11-21 NOTE — Telephone Encounter (Signed)
Phone call from patient requesting her xray results from yesterday. Please advise. Thank you.

## 2012-11-21 NOTE — Telephone Encounter (Signed)
Pt called requesting status of PA for Lidoderm patch.

## 2012-11-24 ENCOUNTER — Telehealth: Payer: Self-pay

## 2012-11-24 ENCOUNTER — Telehealth: Payer: Self-pay | Admitting: *Deleted

## 2012-11-24 NOTE — Telephone Encounter (Signed)
Prior authorization is needed for Lidoderm 5% patch. Authorization form has been submitted.

## 2012-11-24 NOTE — Telephone Encounter (Signed)
Awaiting PA form to be faxed from insurance company.  Called [phone] as per pharmacy for information.

## 2012-11-24 NOTE — Telephone Encounter (Signed)
Pt called again requesting status of PA for Lidoderm patch

## 2012-11-24 NOTE — Telephone Encounter (Signed)
i don't see a PA form at my work station. We need to call to get this approved the patient is in a lot of pain!!! I am happy to speak with a medical person if you get them on the line.

## 2012-11-25 ENCOUNTER — Other Ambulatory Visit (HOSPITAL_COMMUNITY): Payer: Self-pay | Admitting: Oncology

## 2012-11-25 NOTE — Telephone Encounter (Signed)
Received a fax regarding the prior authorization that patient can not be located or ineligible in the system. I submitted patient insurance card and the original PA form. Waiting on response.

## 2012-11-26 ENCOUNTER — Ambulatory Visit (HOSPITAL_BASED_OUTPATIENT_CLINIC_OR_DEPARTMENT_OTHER): Payer: Medicare Other

## 2012-11-26 ENCOUNTER — Other Ambulatory Visit (HOSPITAL_BASED_OUTPATIENT_CLINIC_OR_DEPARTMENT_OTHER): Payer: Medicare Other | Admitting: Lab

## 2012-11-26 VITALS — BP 188/92 | HR 66 | Temp 97.7°F | Resp 18

## 2012-11-26 DIAGNOSIS — C50519 Malignant neoplasm of lower-outer quadrant of unspecified female breast: Secondary | ICD-10-CM

## 2012-11-26 DIAGNOSIS — Z5112 Encounter for antineoplastic immunotherapy: Secondary | ICD-10-CM

## 2012-11-26 DIAGNOSIS — C50919 Malignant neoplasm of unspecified site of unspecified female breast: Secondary | ICD-10-CM

## 2012-11-26 LAB — CBC WITH DIFFERENTIAL/PLATELET
BASO%: 0.6 % (ref 0.0–2.0)
EOS%: 3.3 % (ref 0.0–7.0)
HCT: 43 % (ref 34.8–46.6)
LYMPH%: 21.3 % (ref 14.0–49.7)
MCH: 30.3 pg (ref 25.1–34.0)
MCHC: 32.8 g/dL (ref 31.5–36.0)
MONO%: 9.6 % (ref 0.0–14.0)
NEUT%: 65.2 % (ref 38.4–76.8)
Platelets: 129 10*3/uL — ABNORMAL LOW (ref 145–400)
RBC: 4.66 10*6/uL (ref 3.70–5.45)
nRBC: 0 % (ref 0–0)

## 2012-11-26 LAB — COMPREHENSIVE METABOLIC PANEL (CC13)
AST: 29 U/L (ref 5–34)
Alkaline Phosphatase: 55 U/L (ref 40–150)
BUN: 17.8 mg/dL (ref 7.0–26.0)
Calcium: 10.2 mg/dL (ref 8.4–10.4)
Creatinine: 0.9 mg/dL (ref 0.6–1.1)
Total Bilirubin: 0.57 mg/dL (ref 0.20–1.20)

## 2012-11-26 MED ORDER — ACETAMINOPHEN 325 MG PO TABS
650.0000 mg | ORAL_TABLET | Freq: Once | ORAL | Status: AC
  Administered 2012-11-26: 650 mg via ORAL

## 2012-11-26 MED ORDER — TRASTUZUMAB CHEMO INJECTION 440 MG
6.0000 mg/kg | Freq: Once | INTRAVENOUS | Status: AC
  Administered 2012-11-26: 294 mg via INTRAVENOUS
  Filled 2012-11-26: qty 14

## 2012-11-26 MED ORDER — SODIUM CHLORIDE 0.9 % IV SOLN
Freq: Once | INTRAVENOUS | Status: AC
  Administered 2012-11-26: 11:00:00 via INTRAVENOUS

## 2012-11-26 NOTE — Patient Instructions (Addendum)
[facility] Discharge Instructions for Patients Receiving Chemotherapy  Today you received the following chemotherapy agents Herceptin  To help prevent nausea and vomiting after your treatment, we encourage you to take your nausea medication     If you develop nausea and vomiting that is not controlled by your nausea medication, call the clinic.   BELOW ARE SYMPTOMS THAT SHOULD BE REPORTED IMMEDIATELY:  *FEVER GREATER THAN 100.5 F  *CHILLS WITH OR WITHOUT FEVER  NAUSEA AND VOMITING THAT IS NOT CONTROLLED WITH YOUR NAUSEA MEDICATION  *UNUSUAL SHORTNESS OF BREATH  *UNUSUAL BRUISING OR BLEEDING  TENDERNESS IN MOUTH AND THROAT WITH OR WITHOUT PRESENCE OF ULCERS  *URINARY PROBLEMS  *BOWEL PROBLEMS  UNUSUAL RASH Items with * indicate a potential emergency and should be followed up as soon as possible.  Feel free to call the clinic you have any questions or concerns. The clinic phone number is [phone].    

## 2012-12-08 ENCOUNTER — Encounter (HOSPITAL_COMMUNITY): Payer: Self-pay

## 2012-12-08 ENCOUNTER — Ambulatory Visit (HOSPITAL_BASED_OUTPATIENT_CLINIC_OR_DEPARTMENT_OTHER)
Admission: RE | Admit: 2012-12-08 | Discharge: 2012-12-08 | Disposition: A | Discharge status: Home / Self Care | Payer: Medicare Other | Source: Ambulatory Visit | Attending: Internal Medicine | Admitting: Internal Medicine

## 2012-12-08 ENCOUNTER — Ambulatory Visit (HOSPITAL_COMMUNITY)
Admission: RE | Admit: 2012-12-08 | Discharge: 2012-12-08 | Disposition: A | Discharge status: Home / Self Care | Payer: Medicare Other | Source: Ambulatory Visit | Attending: Oncology | Admitting: Oncology

## 2012-12-08 VITALS — BP 144/90 | HR 71 | Wt 105.8 lb

## 2012-12-08 DIAGNOSIS — I079 Rheumatic tricuspid valve disease, unspecified: Secondary | ICD-10-CM | POA: Insufficient documentation

## 2012-12-08 DIAGNOSIS — Z09 Encounter for follow-up examination after completed treatment for conditions other than malignant neoplasm: Secondary | ICD-10-CM

## 2012-12-08 DIAGNOSIS — I10 Essential (primary) hypertension: Secondary | ICD-10-CM

## 2012-12-08 DIAGNOSIS — C50919 Malignant neoplasm of unspecified site of unspecified female breast: Secondary | ICD-10-CM

## 2012-12-08 DIAGNOSIS — I080 Rheumatic disorders of both mitral and aortic valves: Secondary | ICD-10-CM | POA: Insufficient documentation

## 2012-12-08 MED ORDER — LOSARTAN POTASSIUM 25 MG PO TABS: 25.0000 mg | ORAL_TABLET | Freq: Every day | ORAL | Status: DC

## 2012-12-08 NOTE — Addendum Note (Signed)
Encounter addended by: Noralee Space, RN on: 12/08/2012 12:15 PM<BR>     Documentation filed: Medications, Orders, Patient Instructions Section

## 2012-12-08 NOTE — Progress Notes (Signed)
Patient ID: [name], female   DOB: 1913-05-23, 79 y.o.   MRN: [id number] Oncologist: Dr Darnelle Catalan  HPI: [name] is a delightful 79 y/o woman with history of Had cath in 2000 with no significant CAD. Found to have breast CA in 2004. Initial lumpectomy in 2004. There was evidence of recurrence. And then underwent mastectomy in 2006 at [facility]. Then treated with XRT. Had recurrence in L axillary node in 2010. Apparently the initial tumor was negative for ER/PR/Her 2-neu receptors. However lymph nod HER 2-neu + though to be mutation.   Started on Tykerb and Herceptin. Treated with Tykerb x 6 months.  Continues on Herceptin q 3 weeks (3 years in at this time)  Echos  08/2010: EF 55-60% lat s' hard to see about 8 cm/s  03/2011: EF 55-60% lateral s' velocity 9.2 cm/s 07/2011: EF 55-60% lateral s' 9.3-9.5 cm/s (difficult window) 10/2011:  EF 55-60% lateral s' 8.9, 9.2 cm/s 03/20/12 EF 55-60% lateral S' 9.2 09/08/12 EF  55-60% lateral S'11.0 cm/s 12/08/12 EF 55-60% lateral s' 10.1  Follow up: Denies CP or SOB. She is uncomfortable due to back pain which has been going on for 2 weeks. Had xrays which were negative. When first started hard to walk but now getting better. SBP running mostly around 150 but can go to 190     Past Medical History  Diagnosis Date  . Cancer     breast  . Cataract   . CAD (coronary artery disease)     cath w/ trivial disease  . Diverticulosis of colon   . Osteoporosis     s/p compression fx T12  . Macular degeneration   . Breast cancer    Current Outpatient Prescriptions on File Prior to Encounter  Medication Sig Dispense Refill  . acetaminophen (TYLENOL) 500 MG tablet Take 500 mg by mouth daily as needed.       . Ascorbic Acid (VITAMIN C) 100 MG tablet Take 100 mg by mouth daily.        . cholecalciferol (VITAMIN D) 1000 UNITS tablet Take 1,000 Units by mouth daily.      . clobetasol ointment (TEMOVATE) 0.05 % Apply 1 application topically as needed.        . enalapril (VASOTEC) 2.5 MG tablet Take 1 tablet (2.5 mg total) by mouth daily.  30 tablet  11  . fish oil-omega-3 fatty acids 1000 MG capsule Take 2 g by mouth daily.        Marland Kitchen lidocaine (LIDODERM) 5 % Apply to affected area for 12 hours then remove for 12 hours. Repeat.  10 patch  0  . Multiple Vitamin (MULTIVITAMIN PO) Take 1 tablet by mouth daily.       . Multiple Vitamins-Minerals (ICAPS PO) Take 1 tablet by mouth 2 (two) times daily.       . traMADol (ULTRAM) 50 MG tablet Take 1 tablet (50 mg total) by mouth every 8 (eight) hours as needed for pain.  60 tablet  3  . trastuzumab (HERCEPTIN) 440 MG injection Inject into the vein once.        . zolendronic acid (ZOMETA) 4 MG/5ML injection Inject 4 mg into the vein every 3 (three) months.         No current facility-administered medications on file prior to encounter.    Review of Systems: System review is negative for any constitutional, cardiac, pulmonary, GI or neuro symptoms or complaints other than as described in the HPI.  Objective:    Filed Vitals:   12/08/12 1126  BP: 144/90  Pulse: 71  Weight: 105 lb 12.8 oz (47.991 kg)  SpO2: 96%   Physical Exam: General: Elderly Well appearing. No resp difficulty Son present HEENT: normal Neck: supple. JVD flat. Carotids 2+ bilat; no bruits. No lymphadenopathy or thryomegaly appreciated. Cor: PMI nondisplaced. Regular rate & rhythm. Soft diastolic murmur at RSB Lungs: clear Abdomen: soft, nontender, nondistended. No hepatosplenomegaly. No bruits or masses. Good bowel sounds. Extremities: no cyanosis, clubbing, rash, edema Neuro: alert & orientedx3, cranial nerves grossly intact. moves all 4 extremities w/o difficulty. Affect pleasant. Strength in legs and hip flexors/extenreds are normal     Assessment & Plan:   1) Breast Cancer  I reviewed echos personally. EF and Doppler parameters stable. No HF on exam. Continue Herceptin. F/u 3 months.   2) HTN  --BP elevated goal  130-170. Will switch enalapril to losartan 25 daily.   Daniel Bensimhon,MD 12:09 PM

## 2012-12-08 NOTE — Progress Notes (Signed)
Echocardiogram 2D Echocardiogram has been performed.  [name] 12/08/2012, 12:43 PM

## 2012-12-08 NOTE — Patient Instructions (Addendum)
Stop Enalapril  Start Losartan 25 mg daily  We will contact you in 3 months to schedule your next appointment and echocardiogram

## 2012-12-17 ENCOUNTER — Other Ambulatory Visit (HOSPITAL_BASED_OUTPATIENT_CLINIC_OR_DEPARTMENT_OTHER): Payer: Medicare Other | Admitting: Lab

## 2012-12-17 ENCOUNTER — Ambulatory Visit (HOSPITAL_BASED_OUTPATIENT_CLINIC_OR_DEPARTMENT_OTHER): Payer: Medicare Other

## 2012-12-17 VITALS — BP 153/82 | HR 55 | Temp 97.5°F | Resp 18

## 2012-12-17 DIAGNOSIS — C50519 Malignant neoplasm of lower-outer quadrant of unspecified female breast: Secondary | ICD-10-CM

## 2012-12-17 DIAGNOSIS — C50919 Malignant neoplasm of unspecified site of unspecified female breast: Secondary | ICD-10-CM

## 2012-12-17 DIAGNOSIS — Z5112 Encounter for antineoplastic immunotherapy: Secondary | ICD-10-CM

## 2012-12-17 LAB — COMPREHENSIVE METABOLIC PANEL (CC13)
Albumin: 3.8 g/dL (ref 3.5–5.0)
Alkaline Phosphatase: 54 U/L (ref 40–150)
BUN: 14.1 mg/dL (ref 7.0–26.0)
Glucose: 89 mg/dl (ref 70–140)
Potassium: 3.9 mEq/L (ref 3.5–5.1)
Total Bilirubin: 0.57 mg/dL (ref 0.20–1.20)

## 2012-12-17 LAB — CBC WITH DIFFERENTIAL/PLATELET
Basophils Absolute: 0 10*3/uL (ref 0.0–0.1)
EOS%: 4.5 % (ref 0.0–7.0)
HCT: 44 % (ref 34.8–46.6)
HGB: 14.3 g/dL (ref 11.6–15.9)
LYMPH%: 25.9 % (ref 14.0–49.7)
MCH: 30.1 pg (ref 25.1–34.0)
MCV: 92.6 fL (ref 79.5–101.0)
MONO%: 10.7 % (ref 0.0–14.0)
NEUT%: 58 % (ref 38.4–76.8)

## 2012-12-17 MED ORDER — TRASTUZUMAB CHEMO INJECTION 440 MG
6.0000 mg/kg | Freq: Once | INTRAVENOUS | Status: AC
  Administered 2012-12-17: 294 mg via INTRAVENOUS
  Filled 2012-12-17: qty 14

## 2012-12-17 MED ORDER — SODIUM CHLORIDE 0.9 % IV SOLN
Freq: Once | INTRAVENOUS | Status: AC
  Administered 2012-12-17: 11:00:00 via INTRAVENOUS

## 2012-12-17 MED ORDER — ACETAMINOPHEN 325 MG PO TABS
650.0000 mg | ORAL_TABLET | Freq: Once | ORAL | Status: AC
  Administered 2012-12-17: 650 mg via ORAL

## 2012-12-17 NOTE — Patient Instructions (Addendum)

## 2013-01-07 ENCOUNTER — Other Ambulatory Visit (HOSPITAL_BASED_OUTPATIENT_CLINIC_OR_DEPARTMENT_OTHER): Payer: Medicare Other | Admitting: Lab

## 2013-01-07 ENCOUNTER — Ambulatory Visit (HOSPITAL_BASED_OUTPATIENT_CLINIC_OR_DEPARTMENT_OTHER): Payer: Medicare Other

## 2013-01-07 DIAGNOSIS — C50919 Malignant neoplasm of unspecified site of unspecified female breast: Secondary | ICD-10-CM

## 2013-01-07 DIAGNOSIS — Z5112 Encounter for antineoplastic immunotherapy: Secondary | ICD-10-CM

## 2013-01-07 DIAGNOSIS — C50519 Malignant neoplasm of lower-outer quadrant of unspecified female breast: Secondary | ICD-10-CM

## 2013-01-07 LAB — CBC WITH DIFFERENTIAL/PLATELET
Basophils Absolute: 0 10*3/uL (ref 0.0–0.1)
EOS%: 4.8 % (ref 0.0–7.0)
Eosinophils Absolute: 0.2 10*3/uL (ref 0.0–0.5)
HCT: 43.2 % (ref 34.8–46.6)
HGB: 14 g/dL (ref 11.6–15.9)
MCH: 29.9 pg (ref 25.1–34.0)
MCV: 92.3 fL (ref 79.5–101.0)
MONO%: 10.9 % (ref 0.0–14.0)
NEUT#: 2.6 10*3/uL (ref 1.5–6.5)
NEUT%: 59.3 % (ref 38.4–76.8)
Platelets: 151 10*3/uL (ref 145–400)

## 2013-01-07 LAB — COMPREHENSIVE METABOLIC PANEL (CC13)
Albumin: 4 g/dL (ref 3.5–5.0)
Alkaline Phosphatase: 61 U/L (ref 40–150)
BUN: 11.9 mg/dL (ref 7.0–26.0)
CO2: 27 mEq/L (ref 22–29)
Glucose: 94 mg/dl (ref 70–140)
Total Bilirubin: 0.65 mg/dL (ref 0.20–1.20)

## 2013-01-07 MED ORDER — TRASTUZUMAB CHEMO INJECTION 440 MG
6.0000 mg/kg | Freq: Once | INTRAVENOUS | Status: AC
  Administered 2013-01-07: 294 mg via INTRAVENOUS
  Filled 2013-01-07: qty 14

## 2013-01-07 MED ORDER — ACETAMINOPHEN 325 MG PO TABS
ORAL_TABLET | ORAL | Status: AC
  Administered 2013-01-07: 650 mg
  Filled 2013-01-07: qty 2

## 2013-01-07 MED ORDER — ACETAMINOPHEN 325 MG PO TABS: 650.0000 mg | ORAL_TABLET | Freq: Once | ORAL | Status: AC

## 2013-01-07 MED ORDER — SODIUM CHLORIDE 0.9 % IV SOLN
Freq: Once | INTRAVENOUS | Status: AC
  Administered 2013-01-07: 11:00:00 via INTRAVENOUS

## 2013-01-07 NOTE — Patient Instructions (Signed)
[facility] Discharge Instructions for Patients Receiving Chemotherapy  Today you received the following chemotherapy agents Herceptin.  To help prevent nausea and vomiting after your treatment, we encourage you to take your nausea medication as prescribed.   If you develop nausea and vomiting that is not controlled by your nausea medication, call the clinic.   BELOW ARE SYMPTOMS THAT SHOULD BE REPORTED IMMEDIATELY:  *FEVER GREATER THAN 100.5 F  *CHILLS WITH OR WITHOUT FEVER  NAUSEA AND VOMITING THAT IS NOT CONTROLLED WITH YOUR NAUSEA MEDICATION  *UNUSUAL SHORTNESS OF BREATH  *UNUSUAL BRUISING OR BLEEDING  TENDERNESS IN MOUTH AND THROAT WITH OR WITHOUT PRESENCE OF ULCERS  *URINARY PROBLEMS  *BOWEL PROBLEMS  UNUSUAL RASH Items with * indicate a potential emergency and should be followed up as soon as possible.  Feel free to call the clinic you have any questions or concerns. The clinic phone number is [phone].    

## 2013-01-09 ENCOUNTER — Encounter: Payer: Self-pay | Admitting: Internal Medicine

## 2013-01-09 ENCOUNTER — Ambulatory Visit (INDEPENDENT_AMBULATORY_CARE_PROVIDER_SITE_OTHER): Payer: Medicare Other | Admitting: Internal Medicine

## 2013-01-09 VITALS — BP 138/76 | HR 86 | Temp 97.1°F | Wt 106.8 lb

## 2013-01-09 DIAGNOSIS — H9209 Otalgia, unspecified ear: Secondary | ICD-10-CM

## 2013-01-09 DIAGNOSIS — C50919 Malignant neoplasm of unspecified site of unspecified female breast: Secondary | ICD-10-CM

## 2013-01-09 DIAGNOSIS — H9202 Otalgia, left ear: Secondary | ICD-10-CM

## 2013-01-09 DIAGNOSIS — I10 Essential (primary) hypertension: Secondary | ICD-10-CM

## 2013-01-09 NOTE — Assessment & Plan Note (Signed)
Has been doing well with herceptin  Initial diagnoses 2003 and interval history reviewed - Continue working with oncology as ongoing, followup here as needed

## 2013-01-09 NOTE — Patient Instructions (Signed)
It was good to see you today. We have reviewed your prior records including labs and tests today Medications reviewed and updated, no changes recommended at this time. Your ears have been irrigated of wax today -let us know if continued hearing problems persist for referral to audiologist and hearing testing

## 2013-01-09 NOTE — Progress Notes (Signed)
Subjective:    Patient ID: [name], female    DOB: 1914/03/20, 79 y.o.   MRN: [id number]  Otalgia  There is pain in the left ear. This is a chronic problem. The current episode started more than 1 month ago. The problem occurs every few hours. The problem has been waxing and waning. There has been no fever. The pain is mild. Associated symptoms include drainage, hearing loss and rhinorrhea. Pertinent negatives include no abdominal pain, coughing, diarrhea, ear discharge, headaches, neck pain, rash or sore throat. She has tried nothing for the symptoms. Her past medical history is significant for hearing loss. There is no history of a chronic ear infection.    Past Medical History  Diagnosis Date  . Cancer     breast  . Cataract   . CAD (coronary artery disease)     cath w/ trivial disease  . Diverticulosis of colon   . Osteoporosis     s/p compression fx T12  . Macular degeneration   . Breast cancer     Review of Systems  HENT: Positive for hearing loss, ear pain and rhinorrhea. Negative for sore throat, neck pain and ear discharge.   Respiratory: Negative for cough.   Gastrointestinal: Negative for abdominal pain and diarrhea.  Skin: Negative for rash.  Neurological: Negative for headaches.       Objective:   Physical Exam BP 138/76  Pulse 86  Temp(Src) 97.1 F (36.2 C) (Oral)  Wt 106 lb 12.8 oz (48.444 kg)  BMI 19.88 kg/m2  SpO2 93% Wt Readings from Last 3 Encounters:  01/09/13 106 lb 12.8 oz (48.444 kg)  12/08/12 105 lb 12.8 oz (47.991 kg)  11/20/12 105 lb 6.4 oz (47.809 kg)   Constitutional: She appears well-developed and well-nourished. No distress.  HENT: Head: Normocephalic and atraumatic. No sinus pain to palpation. Ears: min wax R>L; after irrigation: B TMs ok, no erythema or effusion; Nose: Nose normal. Mouth/Throat: Oropharynx is clear and moist. No oropharyngeal exudate.  Eyes: Conjunctivae and EOM are normal. Pupils are equal, round, and reactive  to light. No scleral icterus.  Neck: Normal range of motion. Neck supple. No JVD present. No thyromegaly present.  Cardiovascular: Normal rate, regular rhythm and normal heart sounds.  No murmur heard. No BLE edema. Pulmonary/Chest: Effort normal and breath sounds normal. No respiratory distress. She has no wheezes.  Musculoskeletal: Kyphotic. Senile OA changes hands B. Normal range of motion, no joint effusions.  Neurological: She is alert and oriented to person, place, and time. No cranial nerve deficit. Coordination, balance, strength, speech and gait are normal.  Skin: Skin is warm and dry. No rash noted. No erythema.  Psychiatric: She has a normal mood and affect. Her behavior is normal. Judgment and thought content normal.   Lab Results  Component Value Date   WBC 4.4 01/07/2013   HGB 14.0 01/07/2013   HCT 43.2 01/07/2013   PLT 151 01/07/2013   GLUCOSE 94 01/07/2013   ALT 14 01/07/2013   AST 30 01/07/2013   NA 143 01/07/2013   K 4.1 01/07/2013   CL 106 10/15/2012   CREATININE 0.9 01/07/2013   BUN 11.9 01/07/2013   CO2 27 01/07/2013   TSH 1.082 01/25/2010       Assessment & Plan:   L otalgia -mild and chronic symptoms  no evidence for infection, shingles, cerumen impaction Or other abnormality on exam No evidence for other ENT problems but possible dental source of referred pain -patient has scheduled  this appointment next week  Irrigation performed today by CMA at patient request - reports initial improvement in symptoms following irrigation. 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 Also review of prior records

## 2013-01-09 NOTE — Assessment & Plan Note (Signed)
BP Readings from Last 3 Encounters:  01/09/13 138/76  12/17/12 153/82  12/08/12 144/90   The current medical regimen is effective;  continue present plan and medications.

## 2013-01-28 ENCOUNTER — Ambulatory Visit (HOSPITAL_BASED_OUTPATIENT_CLINIC_OR_DEPARTMENT_OTHER): Payer: Medicare Other | Admitting: Oncology

## 2013-01-28 ENCOUNTER — Other Ambulatory Visit (HOSPITAL_BASED_OUTPATIENT_CLINIC_OR_DEPARTMENT_OTHER): Payer: Medicare Other | Admitting: Lab

## 2013-01-28 ENCOUNTER — Telehealth: Payer: Self-pay | Admitting: Oncology

## 2013-01-28 ENCOUNTER — Ambulatory Visit (HOSPITAL_BASED_OUTPATIENT_CLINIC_OR_DEPARTMENT_OTHER): Payer: Medicare Other

## 2013-01-28 ENCOUNTER — Telehealth: Payer: Self-pay | Admitting: *Deleted

## 2013-01-28 VITALS — BP 162/78 | HR 54 | Temp 97.5°F | Resp 20 | Ht 61.46 in | Wt 106.2 lb

## 2013-01-28 DIAGNOSIS — C50519 Malignant neoplasm of lower-outer quadrant of unspecified female breast: Secondary | ICD-10-CM

## 2013-01-28 DIAGNOSIS — M810 Age-related osteoporosis without current pathological fracture: Secondary | ICD-10-CM

## 2013-01-28 DIAGNOSIS — C50919 Malignant neoplasm of unspecified site of unspecified female breast: Secondary | ICD-10-CM

## 2013-01-28 DIAGNOSIS — C44599 Other specified malignant neoplasm of skin of other part of trunk: Secondary | ICD-10-CM

## 2013-01-28 DIAGNOSIS — C50912 Malignant neoplasm of unspecified site of left female breast: Secondary | ICD-10-CM

## 2013-01-28 DIAGNOSIS — M899 Disorder of bone, unspecified: Secondary | ICD-10-CM

## 2013-01-28 DIAGNOSIS — Z5112 Encounter for antineoplastic immunotherapy: Secondary | ICD-10-CM

## 2013-01-28 DIAGNOSIS — C50812 Malignant neoplasm of overlapping sites of left female breast: Secondary | ICD-10-CM | POA: Insufficient documentation

## 2013-01-28 LAB — CBC WITH DIFFERENTIAL/PLATELET
Basophils Absolute: 0 10*3/uL (ref 0.0–0.1)
Eosinophils Absolute: 0.2 10*3/uL (ref 0.0–0.5)
HGB: 13.4 g/dL (ref 11.6–15.9)
MCV: 91.6 fL (ref 79.5–101.0)
MONO#: 0.4 10*3/uL (ref 0.1–0.9)
MONO%: 10.4 % (ref 0.0–14.0)
NEUT#: 2.6 10*3/uL (ref 1.5–6.5)
Platelets: 143 10*3/uL — ABNORMAL LOW (ref 145–400)
RBC: 4.42 10*6/uL (ref 3.70–5.45)
RDW: 14 % (ref 11.2–14.5)
WBC: 4.2 10*3/uL (ref 3.9–10.3)

## 2013-01-28 LAB — COMPREHENSIVE METABOLIC PANEL (CC13)
Albumin: 4.1 g/dL (ref 3.5–5.0)
Alkaline Phosphatase: 57 U/L (ref 40–150)
BUN: 15.9 mg/dL (ref 7.0–26.0)
CO2: 25 mEq/L (ref 22–29)
Calcium: 10.4 mg/dL (ref 8.4–10.4)
Glucose: 86 mg/dl (ref 70–140)
Potassium: 4.1 mEq/L (ref 3.5–5.1)
Sodium: 143 mEq/L (ref 136–145)
Total Protein: 7.5 g/dL (ref 6.4–8.3)

## 2013-01-28 MED ORDER — TRASTUZUMAB CHEMO INJECTION 440 MG
6.0000 mg/kg | Freq: Once | INTRAVENOUS | Status: AC
  Administered 2013-01-28: 294 mg via INTRAVENOUS
  Filled 2013-01-28: qty 14

## 2013-01-28 MED ORDER — ACETAMINOPHEN 325 MG PO TABS
ORAL_TABLET | ORAL | Status: AC
  Filled 2013-01-28: qty 2

## 2013-01-28 MED ORDER — ZOLEDRONIC ACID 4 MG/5ML IV CONC
3.0000 mg | Freq: Once | INTRAVENOUS | Status: AC
  Administered 2013-01-28: 3 mg via INTRAVENOUS
  Filled 2013-01-28: qty 3.75

## 2013-01-28 MED ORDER — ACETAMINOPHEN 325 MG PO TABS
650.0000 mg | ORAL_TABLET | Freq: Once | ORAL | Status: AC
  Administered 2013-01-28: 650 mg via ORAL

## 2013-01-28 MED ORDER — SODIUM CHLORIDE 0.9 % IV SOLN
Freq: Once | INTRAVENOUS | Status: AC
  Administered 2013-01-28: 11:00:00 via INTRAVENOUS

## 2013-01-28 NOTE — Telephone Encounter (Signed)
Per staff message and POF I have scheduled appts.  JMW  

## 2013-01-28 NOTE — Patient Instructions (Addendum)
[facility] Discharge Instructions for Patients Receiving Chemotherapy  Today you received the following, Herceptin and Zometa.   If you develop nausea and vomiting that is not controlled by your nausea medication, call the clinic.   BELOW ARE SYMPTOMS THAT SHOULD BE REPORTED IMMEDIATELY:  *FEVER GREATER THAN 100.5 F  *CHILLS WITH OR WITHOUT FEVER  NAUSEA AND VOMITING THAT IS NOT CONTROLLED WITH YOUR NAUSEA MEDICATION  *UNUSUAL SHORTNESS OF BREATH  *UNUSUAL BRUISING OR BLEEDING  TENDERNESS IN MOUTH AND THROAT WITH OR WITHOUT PRESENCE OF ULCERS  *URINARY PROBLEMS  *BOWEL PROBLEMS  UNUSUAL RASH Items with * indicate a potential emergency and should be followed up as soon as possible.  Feel free to call the clinic you have any questions or concerns. The clinic phone number is [phone].   Trastuzumab injection for infusion What is this medicine? TRASTUZUMAB (tras TOO zoo mab) is a monoclonal antibody. It targets a protein called HER2. This protein is found in some stomach and breast cancers. This medicine can stop cancer cell growth. This medicine may be used with other cancer treatments. This medicine may be used for other purposes; ask your health care provider or pharmacist if you have questions. What should I tell my health care provider before I take this medicine? They need to know if you have any of these conditions: -heart disease -heart failure -infection (especially a virus infection such as chickenpox, cold sores, or herpes) -lung or breathing disease, like asthma -recent or ongoing radiation therapy -an unusual or allergic reaction to trastuzumab, benzyl alcohol, or other medications, foods, dyes, or preservatives -pregnant or trying to get pregnant -breast-feeding How should I use this medicine? This drug is given as an infusion into a vein. It is administered in a hospital or clinic by a specially trained health care professional. Talk to  your pediatrician regarding the use of this medicine in children. This medicine is not approved for use in children. Overdosage: If you think you have taken too much of this medicine contact a poison control center or emergency room at once. NOTE: This medicine is only for you. Do not share this medicine with others. What if I miss a dose? It is important not to miss a dose. Call your doctor or health care professional if you are unable to keep an appointment. What may interact with this medicine? -cyclophosphamide -doxorubicin -warfarin This list may not describe all possible interactions. Give your health care provider a list of all the medicines, herbs, non-prescription drugs, or dietary supplements you use. Also tell them if you smoke, drink alcohol, or use illegal drugs. Some items may interact with your medicine. What should I watch for while using this medicine? Visit your doctor for checks on your progress. Report any side effects. Continue your course of treatment even though you feel ill unless your doctor tells you to stop. Call your doctor or health care professional for advice if you get a fever, chills or sore throat, or other symptoms of a cold or flu. Do not treat yourself. Try to avoid being around people who are sick. You may experience fever, chills and shaking during your first infusion. These effects are usually mild and can be treated with other medicines. Report any side effects during the infusion to your health care professional. Fever and chills usually do not happen with later infusions. What side effects may I notice from receiving this medicine? Side effects that you should report to your doctor or other health  care professional as soon as possible: -breathing difficulties -chest pain or palpitations -cough -dizziness or fainting -fever or chills, sore throat -skin rash, itching or hives -swelling of the legs or ankles -unusually weak or tired Side effects that  usually do not require medical attention (report to your doctor or other health care professional if they continue or are bothersome): -loss of appetite -headache -muscle aches -nausea This list may not describe all possible side effects. Call your doctor for medical advice about side effects. You may report side effects to FDA at 1-800-FDA-1088. Where should I keep my medicine? This drug is given in a hospital or clinic and will not be stored at home. NOTE: This sheet is a summary. It may not cover all possible information. If you have questions about this medicine, talk to your doctor, pharmacist, or health care provider.  2013, Elsevier/Gold Standard. (02/18/2009 1:43:15 PM)  Zoledronic Acid injection (Hypercalcemia, Oncology) What is this medicine? ZOLEDRONIC ACID (ZOE le dron ik AS id) lowers the amount of calcium loss from bone. It is used to treat too much calcium in your blood from cancer. It is also used to prevent complications of cancer that has spread to the bone. This medicine may be used for other purposes; ask your health care provider or pharmacist if you have questions. What should I tell my health care provider before I take this medicine? They need to know if you have any of these conditions: -aspirin-sensitive asthma -dental disease -kidney disease -an unusual or allergic reaction to zoledronic acid, other medicines, foods, dyes, or preservatives -pregnant or trying to get pregnant -breast-feeding How should I use this medicine? This medicine is for infusion into a vein. It is given by a health care professional in a hospital or clinic setting. Talk to your pediatrician regarding the use of this medicine in children. Special care may be needed. Overdosage: If you think you have taken too much of this medicine contact a poison control center or emergency room at once. NOTE: This medicine is only for you. Do not share this medicine with others. What if I miss a  dose? It is important not to miss your dose. Call your doctor or health care professional if you are unable to keep an appointment. What may interact with this medicine? -certain antibiotics given by injection -NSAIDs, medicines for pain and inflammation, like ibuprofen or naproxen -some diuretics like bumetanide, furosemide -teriparatide -thalidomide This list may not describe all possible interactions. Give your health care provider a list of all the medicines, herbs, non-prescription drugs, or dietary supplements you use. Also tell them if you smoke, drink alcohol, or use illegal drugs. Some items may interact with your medicine. What should I watch for while using this medicine? Visit your doctor or health care professional for regular checkups. It may be some time before you see the benefit from this medicine. Do not stop taking your medicine unless your doctor tells you to. Your doctor may order blood tests or other tests to see how you are doing. Women should inform their doctor if they wish to become pregnant or think they might be pregnant. There is a potential for serious side effects to an unborn child. Talk to your health care professional or pharmacist for more information. You should make sure that you get enough calcium and vitamin D while you are taking this medicine. Discuss the foods you eat and the vitamins you take with your health care professional. Some people who take this medicine  have severe bone, joint, and/or muscle pain. This medicine may also increase your risk for a broken thigh bone. Tell your doctor right away if you have pain in your upper leg or groin. Tell your doctor if you have any pain that does not go away or that gets worse. What side effects may I notice from receiving this medicine? Side effects that you should report to your doctor or health care professional as soon as possible: -allergic reactions like skin rash, itching or hives, swelling of the face,  lips, or tongue -anxiety, confusion, or depression -breathing problems -changes in vision -feeling faint or lightheaded, falls -jaw burning, cramping, pain -muscle cramps, stiffness, or weakness -trouble passing urine or change in the amount of urine Side effects that usually do not require medical attention (report to your doctor or health care professional if they continue or are bothersome): -bone, joint, or muscle pain -fever -hair loss -irritation at site where injected -loss of appetite -nausea, vomiting -stomach upset -tired This list may not describe all possible side effects. Call your doctor for medical advice about side effects. You may report side effects to FDA at 1-800-FDA-1088. Where should I keep my medicine? This drug is given in a hospital or clinic and will not be stored at home. NOTE: This sheet is a summary. It may not cover all possible information. If you have questions about this medicine, talk to your doctor, pharmacist, or health care provider.  2013, Elsevier/Gold Standard. (10/13/2010 9:06:58 AM)

## 2013-01-28 NOTE — Progress Notes (Signed)
[facility]  Telephone:([phone] Fax:(336) [phone]  OFFICE PROGRESS NOTE   ID: [name]   DOB: 20-Feb-1914  MR#: [id number]  GMW#:[id number]  Cc:  [name], [name]  HISTORY OF PRESENT ILLNESS: [name]'s gynecologist, Dr. Conley [name], palpated a mass in the patient's left breast in 2004. The patient has a daughter, who is a Engineer, civil (consulting), formerly I believe an oncology nurse, currently a pulmonary nurse in [place], so the patient went there for her care, and while I do not have all the workup, I do have the pathology report from October 01, 2002, which was her left lumpectomy and sentinel lymph node sampling under Dr. Rexene [name]. This showed [phone]) a 2.5 cm infiltrating ductal carcinoma with lobular features (the cells were E-cadherin positive), involving one of three sentinel lymph nodes. The anterior margin was close at less than 1 mm. The patient saw Dr. Chipper [name] here, and the option of completion mastectomy versus radiotherapy alone was discussed. He felt, and I would probably have agreed, that despite the very close margin, which was nevertheless technically negative, proceeding to radiation without "clearing the margin" further would be adequate. Accordingly, the patient did receive a total of 5040 cGy with an additional 1000 cGy boost to the left breast between September 11 and February 24, 2003.   The patient then did well, but in 2007 developed some redness and bumpiness around the left areola. She brought this to Dr. Wilfred [name] attention, and apparently an initial biopsy was negative, as was a mammogram, however, with further development of the change, the patient had a biopsy under anesthesia, and this apparently was positive (I do not have those records). With this information, she proceeded to left mastectomy on February 26, 2006. The pathology there ([id number]) showed dermal involvement by adenocarcinoma, with pagetoid spread and lymphatic  space invasion. The closest margin being 1.2 cm from the inferior margin. Everything else being amply negative. There were no discrete masses or lesions identified in the left breast. The patient subsequently underwent left mastectomy.   There were a series of further skin recurrences, first in November of 2008. Initially, these were resected, but later there were too many lesions to resect, in addition to a large left axillary lymph node. This was biopsied in January 2011, showing the tumor now to be HER-2/neu positive. Patient has been on anti-HER-2/neu treatments since January 2011, initially with trastuzumab plus lapatinib. Lapatinib was discontinued in April 2011 secondary to side effects. Continues now to receive trastuzumab on a q. 3 week basis. Also receives a zoledronic acid every 3 months.    INTERVAL HISTORY: The patient returns of with her daughter-in-law [name] for followup of her breast cancer.  The interval history is unremarkable. She walks 2 miles every day, keeps her own apartment, does her own cooking. However she is getting more deaf and more blind all the time, she says. She wonders what will happen when she is totally deaf and totally blind.  REVIEW OF SYSTEMS: She has rib pains in the left chest wall and the posterior right chest wall. These can come on with movement, but they can be there when she is just sitting as well. She has mild urinary stress incontinence. "All my problems are just my age" Otherwise she is tolerating her treatments with no side effects that she is aware of.  PAST MEDICAL HISTORY: Past Medical History  Diagnosis Date  . Cancer     breast  . Cataract   . CAD (  coronary artery disease)     cath w/ trivial disease  . Diverticulosis of colon   . Osteoporosis     s/p compression fx T12  . Macular degeneration   . Breast cancer     PAST SURGICAL HISTORY: Past Surgical History  Procedure Laterality Date  . Abdominal hysterectomy    . Breast  lumpectomy    . Excision for paget's vulvar    . Vertbroplasty      T12  . Mastectomy      left  . Flexible sigmoidoscopy N/A 06/27/2012    Procedure: FLEXIBLE SIGMOIDOSCOPY;  Surgeon: Hart Carwin, MD;  Location: [facility];  Service: Endoscopy;  Laterality: N/A;    FAMILY HISTORY The patient's father died in an automobile accident at 58, the patient's mother from a stroke at 64. The patient had one brother who died from causes unknown to her at the age of 69, and a sister who had "lots of problems" and died at the age of 73, but there is no history of breast or ovarian cancer in the family.   GYNECOLOGIC HISTORY: She is GX P3. She took hormone replacement therapy briefly after menopause.   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 She has been a widow since 1997; lives independently, drives, and does all of her activities of daily living. Her daughter, [name], is a Engineer, civil (consulting) in [place]. Her daughter, [name], lives in [place].[name], who frequently comes with the patient to medical visits, is married to the patient's son, who is also her power of attorney. He works for the [employer]. The patient has six grandchildren and three great-grandchildren. She attends [employer].    ADVANCED DIRECTIVES: In place  HEALTH MAINTENANCE: History  Substance Use Topics  . Smoking status: Former Smoker    Quit date: 05/01/1990  . Smokeless tobacco: Never Used  . Alcohol Use: No     Colonoscopy: 02/06/2002  PAP: Not on file  Bone density: Not on file  Lipid panel: Not on file  Allergies  Allergen Reactions  . Solifenacin Succinate     REACTION: "sick" nausea    Current Outpatient Prescriptions  Medication Sig Dispense Refill  . acetaminophen (TYLENOL) 500 MG tablet Take 500 mg by mouth daily as needed.       . Ascorbic Acid (VITAMIN C) 100 MG tablet Take 100 mg by mouth daily.        .  cholecalciferol (VITAMIN D) 1000 UNITS tablet Take 1,000 Units by mouth daily.      . clobetasol ointment (TEMOVATE) 0.05 % Apply 1 application topically as needed.       . fish oil-omega-3 fatty acids 1000 MG capsule Take 2 g by mouth daily.        Marland Kitchen lidocaine (LIDODERM) 5 % Apply to affected area for 12 hours then remove for 12 hours. Repeat.  10 patch  0  . losartan (COZAAR) 25 MG tablet Take 1 tablet (25 mg total) by mouth daily.  30 tablet  6  . Multiple Vitamin (MULTIVITAMIN PO) Take 1 tablet by mouth daily.       . Multiple Vitamins-Minerals (ICAPS PO) Take 1 tablet by mouth 2 (two) times daily.       . traMADol (ULTRAM) 50 MG tablet Take 1 tablet (50 mg total) by mouth every 8 (eight) hours as needed for pain.  60 tablet  3  .  trastuzumab (HERCEPTIN) 440 MG injection Inject into the vein once.        . zolendronic acid (ZOMETA) 4 MG/5ML injection Inject 4 mg into the vein every 3 (three) months.         No current facility-administered medications for this visit.    OBJECTIVE: Elderly white woman who appears stated age 31 Vitals:   01/28/13 1002  BP: 162/78  Pulse: 54  Temp: 97.5 F (36.4 C)  Resp: 20     Body mass index is 19.77 kg/(m^2).    ECOG FS: 1  Sclerae unicteric, pupils equal round and reactive to light,  Oropharynx clear, dentition is fair No cervical or supraclavicular adenopathy Lungs no rales or rhonchi Heart regular rate and rhythm Abd soft, nontender, positive bowel sounds MSK no focal spinal tenderness, no upper extremity lymphedema, Neuro: nonfocal, well oriented, appropriate affect the right breast is unremarkable. The left breast is status post mastectomy. There is no evidence of chest wall recurrence. The left axilla is benign Breasts:     LAB RESULTS: Lab Results  Component Value Date   WBC 4.2 01/28/2013   NEUTROABS 2.6 01/28/2013   HGB 13.4 01/28/2013   HCT 40.5 01/28/2013   MCV 91.6 01/28/2013   PLT 143* 01/28/2013      Chemistry       Component Value Date/Time   NA 143 01/07/2013 1008   NA 139 06/27/2012 0414   K 4.1 01/07/2013 1008   K 3.3* 06/27/2012 0414   CL 106 10/15/2012 1136   CL 109 06/27/2012 0414   CO2 27 01/07/2013 1008   CO2 21 06/27/2012 0414   BUN 11.9 01/07/2013 1008   BUN 7 06/27/2012 0414   CREATININE 0.9 01/07/2013 1008   CREATININE 0.67 06/27/2012 0414      Component Value Date/Time   CALCIUM 10.3 01/07/2013 1008   CALCIUM 8.9 06/27/2012 0414   ALKPHOS 61 01/07/2013 1008   ALKPHOS 43 06/27/2012 0414   AST 30 01/07/2013 1008   AST 34 06/27/2012 0414   ALT 14 01/07/2013 1008   ALT 18 06/27/2012 0414   BILITOT 0.65 01/07/2013 1008   BILITOT 0.2* 06/27/2012 0414       Lab Results  Component Value Date   LABCA2 17 09/26/2011     STUDIES: 1.  Unremarkable Right mammogram at [facility] in 01/2012.  2.  Echocardiogram 12/08/2012 shows an excellent ejection fraction.    ASSESSMENT: Ms. Pharo is a 79 y.o.  [place], [place] woman:  (1) Status post left lumpectomy and sentinel lymph node dissection in 09/2002 for a T2 N1 (Stage II) invasive ductal carcinoma, triple negative, status post radiation therapy completed in 01/2003.  (2) Local recurrence around the areola 01/2006, leading to mastectomy.  (3) A series of further skin recurrences first noted in 03/2007. Initially these were resected, later with too many lesions to resect; there was also a large left axillary lymph node which was biopsied in 04/2009 showing the tumor now to be HER-2 positive.   (4) Since 04/2009 she has been on anti-HER-2 treatment, initially with trastuzumab plus lapatinib, with a lapatinib discontinued 07/2009 because of side effects.   (5) Recurrence of vulvar Paget's disease, originally diagnosed and resected in 2004 and recurred in 02/2012.  Followed by Dr. Huel Cote and Dr. De Blanch.  (6) Continues to receive trastuzumab every 3 weeks and zoledronic acid every 3 months. Most recent echo 12/08/2012 showed  an ejection fraction in the 50-55% range.  (7) Nocturnal leg  cramping  PLAN: She is doing just fine as far as her breast cancer is concerned, and at this point I'm 0.0 brought in for Herceptin to every 4 weeks. I am not going to change the dose. With her bony aches and pains, although they are likely due to arthritis, I think we should obtain a bone scan, since we have not had one in 2 years. She is agreeable to this. We will call her with those results.  Otherwise she will see me again in January. The plan is to continue Herceptin indefinitely. She knows to call for any problems that may develop before her next visit here.  Lowella Dell, MD  01/28/2013 10:19 AM

## 2013-02-01 ENCOUNTER — Other Ambulatory Visit: Payer: Self-pay | Admitting: Oncology

## 2013-02-02 ENCOUNTER — Other Ambulatory Visit: Payer: Self-pay | Admitting: *Deleted

## 2013-02-19 ENCOUNTER — Encounter (HOSPITAL_COMMUNITY)
Admission: RE | Admit: 2013-02-19 | Discharge: 2013-02-19 | Disposition: A | Discharge status: Home / Self Care | Payer: Medicare Other | Source: Ambulatory Visit | Attending: Oncology | Admitting: Oncology

## 2013-02-19 ENCOUNTER — Encounter (HOSPITAL_COMMUNITY): Payer: Self-pay

## 2013-02-19 DIAGNOSIS — C50919 Malignant neoplasm of unspecified site of unspecified female breast: Secondary | ICD-10-CM | POA: Insufficient documentation

## 2013-02-19 DIAGNOSIS — C50912 Malignant neoplasm of unspecified site of left female breast: Secondary | ICD-10-CM

## 2013-02-19 DIAGNOSIS — R079 Chest pain, unspecified: Secondary | ICD-10-CM | POA: Insufficient documentation

## 2013-02-19 MED ORDER — TECHNETIUM TC 99M MEDRONATE IV KIT
26.0000 | PACK | Freq: Once | INTRAVENOUS | Status: AC | PRN
Start: 2013-02-19 — End: 2013-02-19
  Administered 2013-02-19: 26 via INTRAVENOUS

## 2013-02-25 ENCOUNTER — Encounter (INDEPENDENT_AMBULATORY_CARE_PROVIDER_SITE_OTHER): Payer: Self-pay

## 2013-02-25 ENCOUNTER — Ambulatory Visit (HOSPITAL_BASED_OUTPATIENT_CLINIC_OR_DEPARTMENT_OTHER): Payer: Medicare Other

## 2013-02-25 ENCOUNTER — Other Ambulatory Visit (HOSPITAL_BASED_OUTPATIENT_CLINIC_OR_DEPARTMENT_OTHER): Payer: Medicare Other | Admitting: Lab

## 2013-02-25 VITALS — BP 152/78 | HR 63 | Temp 97.0°F | Resp 18

## 2013-02-25 DIAGNOSIS — C44599 Other specified malignant neoplasm of skin of other part of trunk: Secondary | ICD-10-CM

## 2013-02-25 DIAGNOSIS — C44509 Unspecified malignant neoplasm of skin of other part of trunk: Secondary | ICD-10-CM

## 2013-02-25 DIAGNOSIS — C50919 Malignant neoplasm of unspecified site of unspecified female breast: Secondary | ICD-10-CM

## 2013-02-25 DIAGNOSIS — Z5112 Encounter for antineoplastic immunotherapy: Secondary | ICD-10-CM

## 2013-02-25 LAB — CBC WITH DIFFERENTIAL/PLATELET
BASO%: 0.9 % (ref 0.0–2.0)
Basophils Absolute: 0 10*3/uL (ref 0.0–0.1)
EOS%: 3.7 % (ref 0.0–7.0)
HCT: 41.5 % (ref 34.8–46.6)
HGB: 13.5 g/dL (ref 11.6–15.9)
LYMPH%: 21.4 % (ref 14.0–49.7)
MCHC: 32.5 g/dL (ref 31.5–36.0)
MONO#: 0.5 10*3/uL (ref 0.1–0.9)
MONO%: 12.4 % (ref 0.0–14.0)
Platelets: 142 10*3/uL — ABNORMAL LOW (ref 145–400)
RBC: 4.48 10*6/uL (ref 3.70–5.45)
WBC: 4.3 10*3/uL (ref 3.9–10.3)
nRBC: 0 % (ref 0–0)

## 2013-02-25 LAB — COMPREHENSIVE METABOLIC PANEL (CC13)
ALT: 13 U/L (ref 0–55)
AST: 31 U/L (ref 5–34)
Albumin: 3.4 g/dL — ABNORMAL LOW (ref 3.5–5.0)
Alkaline Phosphatase: 46 U/L (ref 40–150)
Anion Gap: 5 mEq/L (ref 3–11)
Creatinine: 0.8 mg/dL (ref 0.6–1.1)
Total Bilirubin: 0.53 mg/dL (ref 0.20–1.20)

## 2013-02-25 MED ORDER — SODIUM CHLORIDE 0.9 % IV SOLN
Freq: Once | INTRAVENOUS | Status: AC
  Administered 2013-02-25: 11:00:00 via INTRAVENOUS

## 2013-02-25 MED ORDER — ACETAMINOPHEN 325 MG PO TABS
ORAL_TABLET | ORAL | Status: AC
  Filled 2013-02-25: qty 2

## 2013-02-25 MED ORDER — ACETAMINOPHEN 325 MG PO TABS
650.0000 mg | ORAL_TABLET | Freq: Once | ORAL | Status: AC
  Administered 2013-02-25: 650 mg via ORAL

## 2013-02-25 MED ORDER — TRASTUZUMAB CHEMO INJECTION 440 MG
6.0000 mg/kg | Freq: Once | INTRAVENOUS | Status: AC
  Administered 2013-02-25: 294 mg via INTRAVENOUS
  Filled 2013-02-25: qty 14

## 2013-02-25 NOTE — Patient Instructions (Signed)
[facility] Discharge Instructions for Patients Receiving Chemotherapy  Today you received the following chemotherapy agents: Herceptin.   If you develop nausea and vomiting that is not controlled by your nausea medication, call the clinic.   BELOW ARE SYMPTOMS THAT SHOULD BE REPORTED IMMEDIATELY:  *FEVER GREATER THAN 100.5 F  *CHILLS WITH OR WITHOUT FEVER  NAUSEA AND VOMITING THAT IS NOT CONTROLLED WITH YOUR NAUSEA MEDICATION  *UNUSUAL SHORTNESS OF BREATH  *UNUSUAL BRUISING OR BLEEDING  TENDERNESS IN MOUTH AND THROAT WITH OR WITHOUT PRESENCE OF ULCERS  *URINARY PROBLEMS  *BOWEL PROBLEMS  UNUSUAL RASH Items with * indicate a potential emergency and should be followed up as soon as possible.  Feel free to call the clinic should you have any questions or concerns. The clinic phone number is [phone].

## 2013-03-03 ENCOUNTER — Telehealth: Payer: Self-pay | Admitting: *Deleted

## 2013-03-03 NOTE — Telephone Encounter (Signed)
Pt called to obtain results of bone scan perform 10/23.  Results discussed with pt denying any recent falls related to abnormal rib film readings. Note pt does have history of Paget's disease.  Aldeen states she has mild noted " arthritic pain and am beginning to feel my age ". She does state noted " sharp pain that takes my breath away when I move and lift in a perticular manner"  Pain is in area of history of surgery.  Pt is continuing with ability to maintain ADL's in independent living situation. She has given up driving due to macular degeneration and did inquire about services that could be of assistance for weekly transportation needs " like someone who could come in to drive me to my appointments and errands several hours a week ".  Pt is walking 2 miles almost daily.  This RN will contact SW for above resource inquiry. This note will be given to MD for review as well.

## 2013-03-16 ENCOUNTER — Telehealth: Payer: Self-pay | Admitting: *Deleted

## 2013-03-16 ENCOUNTER — Other Ambulatory Visit: Payer: Self-pay | Admitting: *Deleted

## 2013-03-16 ENCOUNTER — Other Ambulatory Visit: Payer: Self-pay | Admitting: Oncology

## 2013-03-16 MED ORDER — TRAMADOL HCL 50 MG PO TABS: 50.0000 mg | ORAL_TABLET | Freq: Four times a day (QID) | ORAL | Status: DC | PRN

## 2013-03-16 NOTE — Telephone Encounter (Signed)
After Dr Darnelle Catalan has reviewed scans on 02/19/13, called patient back regarding right side rib pain. Could be due to trauma/previous radiation. He does not want to make any changes at this time, but would be glad to provide patient something for discomfort. Will call in Ultram per Dr Darnelle Catalan. Patient also was inquiring if Social Worker was able to find any help for assistance in her independent living situation, she needs help to ADL's and driving. (per note from Mena Pauls, RN on 03/03/2013)

## 2013-03-19 ENCOUNTER — Encounter: Payer: Self-pay | Admitting: *Deleted

## 2013-03-19 NOTE — Progress Notes (Signed)
[facility] Clinical Social Work  Clinical Social Work was referred by Systems developer for assessment of psychosocial needs due to Pt requesting assistance with ADLS at home.  Clinical Social Worker contacted patient at home to offer support and assess for needs.  Pt reports to live alone in [place]. She has concerns as she failed her driving test last week and has issues with her hearing as well. Pt states she is being "proactive" and does not need help right now, but would like someone to take her shopping and help with the housework. CSW suggested we meet at her next appointment to explore further as it was difficult to discuss over the phone. CSW will meet with Pt at next appointment and assist her with a list of home agencies that might address these concerns. Pt very appreciative.   CSW to see Pt in December, as she feels she cancelled her November appointments.    Doreen Salvage, LCSW Clinical Social Worker [facility] Wednesday, Thursday and Friday Phone: [phone] Fax: [phone]

## 2013-03-23 ENCOUNTER — Other Ambulatory Visit: Payer: Medicare Other | Admitting: Lab

## 2013-03-25 ENCOUNTER — Other Ambulatory Visit: Payer: Medicare Other | Admitting: Lab

## 2013-03-25 ENCOUNTER — Ambulatory Visit: Payer: Medicare Other

## 2013-04-20 ENCOUNTER — Other Ambulatory Visit: Payer: Medicare Other | Admitting: Lab

## 2013-04-22 ENCOUNTER — Other Ambulatory Visit: Payer: Medicare Other

## 2013-04-22 ENCOUNTER — Ambulatory Visit: Payer: Medicare Other

## 2013-05-18 ENCOUNTER — Telehealth: Payer: Self-pay | Admitting: Oncology

## 2013-05-18 ENCOUNTER — Ambulatory Visit (HOSPITAL_BASED_OUTPATIENT_CLINIC_OR_DEPARTMENT_OTHER): Payer: Medicare Other

## 2013-05-18 ENCOUNTER — Other Ambulatory Visit: Payer: Self-pay | Admitting: *Deleted

## 2013-05-18 ENCOUNTER — Other Ambulatory Visit (HOSPITAL_BASED_OUTPATIENT_CLINIC_OR_DEPARTMENT_OTHER): Payer: Medicare Other

## 2013-05-18 ENCOUNTER — Other Ambulatory Visit: Payer: Self-pay | Admitting: Oncology

## 2013-05-18 ENCOUNTER — Ambulatory Visit (HOSPITAL_BASED_OUTPATIENT_CLINIC_OR_DEPARTMENT_OTHER): Payer: Medicare Other | Admitting: Oncology

## 2013-05-18 ENCOUNTER — Encounter (INDEPENDENT_AMBULATORY_CARE_PROVIDER_SITE_OTHER): Payer: Self-pay

## 2013-05-18 VITALS — BP 178/84 | HR 65 | Temp 97.3°F | Resp 18 | Ht 61.46 in | Wt 106.2 lb

## 2013-05-18 DIAGNOSIS — C44509 Unspecified malignant neoplasm of skin of other part of trunk: Secondary | ICD-10-CM

## 2013-05-18 DIAGNOSIS — C773 Secondary and unspecified malignant neoplasm of axilla and upper limb lymph nodes: Secondary | ICD-10-CM

## 2013-05-18 DIAGNOSIS — C779 Secondary and unspecified malignant neoplasm of lymph node, unspecified: Secondary | ICD-10-CM

## 2013-05-18 DIAGNOSIS — C44599 Other specified malignant neoplasm of skin of other part of trunk: Secondary | ICD-10-CM

## 2013-05-18 DIAGNOSIS — Z5112 Encounter for antineoplastic immunotherapy: Secondary | ICD-10-CM

## 2013-05-18 DIAGNOSIS — I2510 Atherosclerotic heart disease of native coronary artery without angina pectoris: Secondary | ICD-10-CM

## 2013-05-18 DIAGNOSIS — C50919 Malignant neoplasm of unspecified site of unspecified female breast: Secondary | ICD-10-CM

## 2013-05-18 DIAGNOSIS — M810 Age-related osteoporosis without current pathological fracture: Secondary | ICD-10-CM

## 2013-05-18 DIAGNOSIS — C50912 Malignant neoplasm of unspecified site of left female breast: Secondary | ICD-10-CM

## 2013-05-18 DIAGNOSIS — D649 Anemia, unspecified: Secondary | ICD-10-CM

## 2013-05-18 LAB — COMPREHENSIVE METABOLIC PANEL (CC13)
ALT: 18 U/L (ref 0–55)
AST: 34 U/L (ref 5–34)
Albumin: 4.3 g/dL (ref 3.5–5.0)
Alkaline Phosphatase: 57 U/L (ref 40–150)
Anion Gap: 13 mEq/L — ABNORMAL HIGH (ref 3–11)
BUN: 15.3 mg/dL (ref 7.0–26.0)
CALCIUM: 10.6 mg/dL — AB (ref 8.4–10.4)
CHLORIDE: 105 meq/L (ref 98–109)
CO2: 26 mEq/L (ref 22–29)
Creatinine: 0.9 mg/dL (ref 0.6–1.1)
Glucose: 89 mg/dl (ref 70–140)
Potassium: 4 mEq/L (ref 3.5–5.1)
SODIUM: 143 meq/L (ref 136–145)
Total Bilirubin: 0.62 mg/dL (ref 0.20–1.20)
Total Protein: 7.7 g/dL (ref 6.4–8.3)

## 2013-05-18 LAB — CBC WITH DIFFERENTIAL/PLATELET
BASO%: 1 % (ref 0.0–2.0)
Basophils Absolute: 0.1 10*3/uL (ref 0.0–0.1)
EOS%: 2.7 % (ref 0.0–7.0)
Eosinophils Absolute: 0.1 10*3/uL (ref 0.0–0.5)
HCT: 41.9 % (ref 34.8–46.6)
HGB: 13.6 g/dL (ref 11.6–15.9)
LYMPH%: 20.3 % (ref 14.0–49.7)
MCH: 30 pg (ref 25.1–34.0)
MCHC: 32.5 g/dL (ref 31.5–36.0)
MCV: 92.3 fL (ref 79.5–101.0)
MONO#: 0.6 10*3/uL (ref 0.1–0.9)
MONO%: 11.8 % (ref 0.0–14.0)
NEUT%: 64.2 % (ref 38.4–76.8)
NEUTROS ABS: 3.1 10*3/uL (ref 1.5–6.5)
NRBC: 0 % (ref 0–0)
Platelets: 142 10*3/uL — ABNORMAL LOW (ref 145–400)
RBC: 4.54 10*6/uL (ref 3.70–5.45)
RDW: 14.1 % (ref 11.2–14.5)
WBC: 4.8 10*3/uL (ref 3.9–10.3)
lymph#: 1 10*3/uL (ref 0.9–3.3)

## 2013-05-18 MED ORDER — ZOLEDRONIC ACID 4 MG/5ML IV CONC
3.0000 mg | Freq: Once | INTRAVENOUS | Status: AC
  Administered 2013-05-18: 3 mg via INTRAVENOUS
  Filled 2013-05-18: qty 3.75

## 2013-05-18 MED ORDER — TRASTUZUMAB CHEMO INJECTION 440 MG
6.0000 mg/kg | Freq: Once | INTRAVENOUS | Status: AC
  Administered 2013-05-18: 294 mg via INTRAVENOUS
  Filled 2013-05-18: qty 14

## 2013-05-18 MED ORDER — SODIUM CHLORIDE 0.9 % IV SOLN
Freq: Once | INTRAVENOUS | Status: AC
  Administered 2013-05-18: 12:00:00 via INTRAVENOUS

## 2013-05-18 MED ORDER — ACETAMINOPHEN 325 MG PO TABS
650.0000 mg | ORAL_TABLET | Freq: Once | ORAL | Status: AC
  Administered 2013-05-18: 650 mg via ORAL

## 2013-05-18 MED ORDER — ACETAMINOPHEN 325 MG PO TABS
ORAL_TABLET | ORAL | Status: AC
  Filled 2013-05-18: qty 2

## 2013-05-18 NOTE — Patient Instructions (Signed)
[facility] Discharge Instructions for Patients Receiving Chemotherapy  Today you received the following chemotherapy agents Herceptin.  To help prevent nausea and vomiting after your treatment, we encourage you to take your nausea medication as prescribed.   If you develop nausea and vomiting that is not controlled by your nausea medication, call the clinic.   BELOW ARE SYMPTOMS THAT SHOULD BE REPORTED IMMEDIATELY:  *FEVER GREATER THAN 100.5 F  *CHILLS WITH OR WITHOUT FEVER  NAUSEA AND VOMITING THAT IS NOT CONTROLLED WITH YOUR NAUSEA MEDICATION  *UNUSUAL SHORTNESS OF BREATH  *UNUSUAL BRUISING OR BLEEDING  TENDERNESS IN MOUTH AND THROAT WITH OR WITHOUT PRESENCE OF ULCERS  *URINARY PROBLEMS  *BOWEL PROBLEMS  UNUSUAL RASH Items with * indicate a potential emergency and should be followed up as soon as possible.  Feel free to call the clinic you have any questions or concerns. The clinic phone number is [phone].  Zoledronic Acid injection (Hypercalcemia, Oncology) What is this medicine? ZOLEDRONIC ACID (ZOE le dron ik AS id) lowers the amount of calcium loss from bone. It is used to treat too much calcium in your blood from cancer. It is also used to prevent complications of cancer that has spread to the bone. This medicine may be used for other purposes; ask your health care provider or pharmacist if you have questions. COMMON BRAND NAME(S): Zometa What should I tell my health care provider before I take this medicine? They need to know if you have any of these conditions: -aspirin-sensitive asthma -cancer, especially if you are receiving medicines used to treat cancer -dental disease or wear dentures -infection -kidney disease -receiving corticosteroids like dexamethasone or prednisone -an unusual or allergic reaction to zoledronic acid, other medicines, foods, dyes, or preservatives -pregnant or trying to get pregnant -breast-feeding How should I  use this medicine? This medicine is for infusion into a vein. It is given by a health care professional in a hospital or clinic setting. Talk to your pediatrician regarding the use of this medicine in children. Special care may be needed. Overdosage: If you think you have taken too much of this medicine contact a poison control center or emergency room at once. NOTE: This medicine is only for you. Do not share this medicine with others. What if I miss a dose? It is important not to miss your dose. Call your doctor or health care professional if you are unable to keep an appointment. What may interact with this medicine? -certain antibiotics given by injection -NSAIDs, medicines for pain and inflammation, like ibuprofen or naproxen -some diuretics like bumetanide, furosemide -teriparatide -thalidomide This list may not describe all possible interactions. Give your health care provider a list of all the medicines, herbs, non-prescription drugs, or dietary supplements you use. Also tell them if you smoke, drink alcohol, or use illegal drugs. Some items may interact with your medicine. What should I watch for while using this medicine? Visit your doctor or health care professional for regular checkups. It may be some time before you see the benefit from this medicine. Do not stop taking your medicine unless your doctor tells you to. Your doctor may order blood tests or other tests to see how you are doing. Women should inform their doctor if they wish to become pregnant or think they might be pregnant. There is a potential for serious side effects to an unborn child. Talk to your health care professional or pharmacist for more information. You should make sure that you get enough  calcium and vitamin D while you are taking this medicine. Discuss the foods you eat and the vitamins you take with your health care professional. Some people who take this medicine have severe bone, joint, and/or muscle pain.  This medicine may also increase your risk for jaw problems or a broken thigh bone. Tell your doctor right away if you have severe pain in your jaw, bones, joints, or muscles. Tell your doctor if you have any pain that does not go away or that gets worse. Tell your dentist and dental surgeon that you are taking this medicine. You should not have major dental surgery while on this medicine. See your dentist to have a dental exam and fix any dental problems before starting this medicine. Take good care of your teeth while on this medicine. Make sure you see your dentist for regular follow-up appointments. What side effects may I notice from receiving this medicine? Side effects that you should report to your doctor or health care professional as soon as possible: -allergic reactions like skin rash, itching or hives, swelling of the face, lips, or tongue -anxiety, confusion, or depression -breathing problems -changes in vision -eye pain -feeling faint or lightheaded, falls -jaw pain, especially after dental work -mouth sores -muscle cramps, stiffness, or weakness -trouble passing urine or change in the amount of urine Side effects that usually do not require medical attention (report to your doctor or health care professional if they continue or are bothersome): -bone, joint, or muscle pain -constipation -diarrhea -fever -hair loss -irritation at site where injected -loss of appetite -nausea, vomiting -stomach upset -trouble sleeping -trouble swallowing -weak or tired This list may not describe all possible side effects. Call your doctor for medical advice about side effects. You may report side effects to FDA at 1-800-FDA-1088. Where should I keep my medicine? This drug is given in a hospital or clinic and will not be stored at home. NOTE: This sheet is a summary. It may not cover all possible information. If you have questions about this medicine, talk to your doctor, pharmacist, or  health care provider.  2014, Elsevier/Gold Standard. (2012-09-25 13:03:13)

## 2013-05-18 NOTE — Addendum Note (Signed)
Addended by: Laureen Abrahams on: 05/18/2013 05:51 PM   Modules accepted: Orders

## 2013-05-18 NOTE — Progress Notes (Signed)
[facility]  [phone] [phone]  OFFICE PROGRESS NOTE   ID: JESSACA PHILIPPI   DOB: 03-Mar-1914  MR#: [id number]  JJO#:[id number]  Cc:  Azalee Course, Glori Bickers  HISTORY OF PRESENT ILLNESS: Mrs. Cowger's gynecologist, Dr. Josefa Half, palpated a mass in the patient's left breast in 2004. The patient has a daughter, who is a Marine scientist, formerly I believe an oncology nurse, currently a pulmonary nurse in [place], so the patient went there for her care, and while I do not have all the workup, I do have the pathology report from October 01, 2002, which was her left lumpectomy and sentinel lymph node sampling under Dr. Kemper Durie. This showed [phone]) a 2.5 cm infiltrating ductal carcinoma with lobular features (the cells were E-cadherin positive), involving one of three sentinel lymph nodes. The anterior margin was close at less than 1 mm. The patient saw Dr. Arloa Koh here, and the option of completion mastectomy versus radiotherapy alone was discussed. He felt, and I would probably have agreed, that despite the very close margin, which was nevertheless technically negative, proceeding to radiation without "clearing the margin" further would be adequate. Accordingly, the patient did receive a total of 5040 cGy with an additional 1000 cGy boost to the left breast between September 11 and February 24, 2003.   The patient then did well, but in 2007 developed some redness and bumpiness around the left areola. She brought this to Dr. Ermelinda Das attention, and apparently an initial biopsy was negative, as was a mammogram, however, with further development of the change, the patient had a biopsy under anesthesia, and this apparently was positive (I do not have those records). With this information, she proceeded to left mastectomy on February 26, 2006. The pathology there ([id number]) showed dermal involvement by adenocarcinoma, with pagetoid spread and lymphatic  space invasion. The closest margin being 1.2 cm from the inferior margin. Everything else being amply negative. There were no discrete masses or lesions identified in the left breast. The patient subsequently underwent left mastectomy.   There were a series of further skin recurrences, first in November of 2008. Initially, these were resected, but later there were too many lesions to resect, in addition to a large left axillary lymph node. This was biopsied in January 2011, showing the tumor now to be HER-2/neu positive. Patient has been on anti-HER-2/neu treatments since January 2011, initially with trastuzumab plus lapatinib. Lapatinib was discontinued in April 2011 secondary to side effects. Continues now to receive trastuzumab on a q. 3 week basis. Also receives a zoledronic acid every 3 months.    INTERVAL HISTORY: The patient returns of with her daughter-in-law Juliann Pulse for followup of her breast cancer.  The interval history is significant for Menna having had her 80th birthday, at which time she lost her driver's license. She has hired a lady on an hourly rate to drive her to the grocery store and elsewhere, but she will like alternatives and requested that our social worker call her regarding options  REVIEW OF SYSTEMS: She is having to give up a lot because what I can see and I can tear". She is thinking of not going to her small church community because she can't participate went to many people are talking. (She does fine one-on-one). She hurts all the time everywhere, and Tylenol only helps a little bit. She doesn't want to use any other pain medicines and she doesn't even use the Tylenol more than a couple of times a week.  She sleeps poorly. She still can read though. She does not use a cane to walk. She tells me her son has restricted her to the parking lot where she lives and does not g across the street to the park. In fact she tells me sometimes she gets cramps and he she fell she wouldn't  know what to do. She is going to get her cell phone but reactivated". There have been no unusual headaches, fall, diarrhea, or dizziness. A detailed review of systems was otherwise stable  PAST MEDICAL HISTORY: Past Medical History  Diagnosis Date  . Cancer     breast  . Cataract   . CAD (coronary artery disease)     cath w/ trivial disease  . Diverticulosis of colon   . Osteoporosis     s/p compression fx T12  . Macular degeneration   . Breast cancer     PAST SURGICAL HISTORY: Past Surgical History  Procedure Laterality Date  . Abdominal hysterectomy    . Breast lumpectomy    . Excision for paget's vulvar    . Vertbroplasty      T12  . Mastectomy      left  . Flexible sigmoidoscopy N/A 06/27/2012    Procedure: FLEXIBLE SIGMOIDOSCOPY;  Surgeon: Lafayette Dragon, MD;  Location: [facility];  Service: Endoscopy;  Laterality: N/A;    FAMILY HISTORY The patient's father died in an automobile accident at 31, the patient's mother from a stroke at 79. The patient had one brother who died from causes unknown to her at the age of 71, and a sister who had "lots of problems" and died at the age of 73, but there is no history of breast or ovarian cancer in the family.   GYNECOLOGIC HISTORY: She is GX P3. She took hormone replacement therapy briefly after menopause.   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 She has been a widow since 1997; lives independently, drives, and does all of her activities of daily living. Her daughter, [name], is a Marine scientist in [place]. Her daughter, [name], lives in [place].[name], who frequently comes with the patient to medical visits, is married to the patient's son, who is also her power of attorney. He works for the [employer]. The patient has six grandchildren and three great-grandchildren. She attends [employer].    ADVANCED DIRECTIVES: In place  HEALTH  MAINTENANCE: History  Substance Use Topics  . Smoking status: Former Smoker    Quit date: 05/01/1990  . Smokeless tobacco: Never Used  . Alcohol Use: No     Colonoscopy: 02/06/2002  PAP: Not on file  Bone density: Not on file  Lipid panel: Not on file  Allergies  Allergen Reactions  . Solifenacin Succinate     REACTION: "sick" nausea    Current Outpatient Prescriptions  Medication Sig Dispense Refill  . acetaminophen (TYLENOL) 500 MG tablet Take 500 mg by mouth daily as needed.       . Ascorbic Acid (VITAMIN C) 100 MG tablet Take 100 mg by mouth daily.        . cholecalciferol (VITAMIN D) 1000 UNITS tablet Take 1,000 Units by mouth daily.      . clobetasol ointment (TEMOVATE) 6.28 % Apply 1 application topically as needed.       . enalapril (VASOTEC) 2.5 MG tablet       . fish oil-omega-3 fatty acids 1000 MG  capsule Take 2 g by mouth daily.        Marland Kitchen lidocaine (LIDODERM) 5 % Apply to affected area for 12 hours then remove for 12 hours. Repeat.  10 patch  0  . losartan (COZAAR) 25 MG tablet Take 1 tablet (25 mg total) by mouth daily.  30 tablet  6  . Multiple Vitamin (MULTIVITAMIN PO) Take 1 tablet by mouth daily.       . Multiple Vitamins-Minerals (ICAPS PO) Take 1 tablet by mouth 2 (two) times daily.       . traMADol (ULTRAM) 50 MG tablet Take 1 tablet (50 mg total) by mouth every 6 (six) hours as needed.  30 tablet  0  . trastuzumab (HERCEPTIN) 440 MG injection Inject into the vein once.        . zolendronic acid (ZOMETA) 4 MG/5ML injection Inject 4 mg into the vein every 3 (three) months.         No current facility-administered medications for this visit.    OBJECTIVE: Elderly white woman who appears stated age 6 Vitals:   05/18/13 1055  BP: 178/84  Pulse: 65  Temp: 97.3 F (36.3 C)  Resp: 18     Body mass index is 19.77 kg/(m^2).    ECOG FS: 1  Sclerae unicteric, pupils equal and round  Oropharynx clear, dentition is fair No cervical or supraclavicular  adenopathy Lungs no rales or rhonchi Heart regular rate and rhythm Abd soft, nontender, positive bowel sounds MSK kyphosis but no focal spinal tenderness, no upper extremity lymphedema, Neuro: nonfocal, well oriented, frustrated and slightly depressed affect  Breasts: the right breast is unremarkable. The left breast is status post mastectomy. There is no evidence of chest wall recurrence. There are some telangiectasias secondary to the prior chest wall irradiation. The left axilla is benign    LAB RESULTS: Lab Results  Component Value Date   WBC 4.8 05/18/2013   NEUTROABS 3.1 05/18/2013   HGB 13.6 05/18/2013   HCT 41.9 05/18/2013   MCV 92.3 05/18/2013   PLT 142* 05/18/2013      Chemistry      Component Value Date/Time   NA 141 02/25/2013 1008   NA 139 06/27/2012 0414   K 4.1 02/25/2013 1008   K 3.3* 06/27/2012 0414   CL 106 10/15/2012 1136   CL 109 06/27/2012 0414   CO2 24 02/25/2013 1008   CO2 21 06/27/2012 0414   BUN 11.4 02/25/2013 1008   BUN 7 06/27/2012 0414   CREATININE 0.8 02/25/2013 1008   CREATININE 0.67 06/27/2012 0414      Component Value Date/Time   CALCIUM 9.7 02/25/2013 1008   CALCIUM 8.9 06/27/2012 0414   ALKPHOS 46 02/25/2013 1008   ALKPHOS 43 06/27/2012 0414   AST 31 02/25/2013 1008   AST 34 06/27/2012 0414   ALT 13 02/25/2013 1008   ALT 18 06/27/2012 0414   BILITOT 0.53 02/25/2013 1008   BILITOT 0.2* 06/27/2012 0414       Lab Results  Component Value Date   LABCA2 17 09/26/2011     STUDIES: Study Result    CLINICAL DATA: Breast cancer. Increase left-sided chest and rib  pain  EXAM:  NUCLEAR MEDICINE WHOLE BODY BONE SCAN  TECHNIQUE:  Whole body anterior and posterior images were obtained approximately  3 hours after intravenous injection of radiopharmaceutical.  COMPARISON: Bone scan 02/17/2011, plain films 11/20/2012  RADIOPHARMACEUTICALS: 26.0 mCi Technetium99 MDP  FINDINGS:  Multiple new foci of abnormal radiotracer uptake at  the junction of   the right posterior ribs in the vertebral bodies involving the 5th,  6, 7, 8) ribs. Uptake in the posterior 10 rib is also new. This is  in a pattern suggestive trauma. More linear uptake within the  anterior medial 5th rib on the left is more concerning for  metastasis. Degenerate uptake noted in the lower lumbar spine. No  abnormal uptake within the appendicular skeleton to suggest  metastasis.  IMPRESSION:  1. Traumatic pattern uptake radiotracer uptake within the posterior  right ribs suggest fractures  2. More linear uptake involving the anterior left 5th rib is  concerning for metastasis although cannot exclude trauma.  5. A sodium fluoride PET-CT bone scan may be beneficial for further  characterization.  Electronically Signed  By: Suzy Bouchard M.D.  On: 02/19/2013 14:40        ASSESSMENT: Ms. Riggsbee is a 80 y.o.  [place], [place] woman:  (1) Status post left lumpectomy and sentinel lymph node dissection in 09/2002 for a T2 N1 (Stage II) invasive ductal carcinoma, triple negative, status post radiation therapy completed in 01/2003.  (2) Local recurrence around the areola 01/2006, leading to mastectomy.  (3) A series of further skin recurrences first noted in 03/2007. Initially these were resected, later with too many lesions to resect; there was also a large left axillary lymph node which was biopsied in 04/2009 showing the tumor now to be HER-2 positive.   (4) Since 04/2009 she has been on anti-HER-2 treatment, initially with trastuzumab plus lapatinib, with a lapatinib discontinued 07/2009 because of side effects.   (5) Recurrence of vulvar Paget's disease, originally diagnosed and resected in 2004 and recurred in 02/2012.  Followed by Dr. Paula Compton and Dr. Marti Sleigh.  (6) Continues to receive trastuzumab every 3 weeks and zoledronic acid every 3 months. Most recent echo 12/08/2012 showed an ejection fraction in the 50-55% range.  (7)  Nocturnal leg cramping  PLAN: [place] world is getting smaller and losing her driver's license is a major blow to her. She remains very active at church, reads, and of course there is family. I will ask one of our social workers if there are volunteers that might be able to drive for occasionally if she knows well in advance where she is going and when.   From a breast cancer point of view she is doing terrific and continues in remission. We are now doing the trastuzumab every 4 weeks, and she will like to do that on Wednesdays. I have changed the dates accordingly.  She should have had an echocardiogram sometime in November, but for some reason I did not happen. We will put that in for her next available.  She knows to call for any problems that may develop before next visit here.  Chauncey Cruel, MD  05/18/2013 10:58 AM

## 2013-05-19 ENCOUNTER — Telehealth: Payer: Self-pay | Admitting: *Deleted

## 2013-05-19 NOTE — Telephone Encounter (Signed)
Per staff message and POF I have scheduled appts.  Advised scheduler to move lab JMW  

## 2013-05-20 ENCOUNTER — Other Ambulatory Visit: Payer: Medicare Other | Admitting: Lab

## 2013-05-20 ENCOUNTER — Telehealth: Payer: Self-pay | Admitting: Oncology

## 2013-05-20 NOTE — Telephone Encounter (Signed)
, °

## 2013-05-22 ENCOUNTER — Telehealth: Payer: Self-pay | Admitting: Oncology

## 2013-05-22 NOTE — Telephone Encounter (Signed)
, °

## 2013-05-25 ENCOUNTER — Other Ambulatory Visit (HOSPITAL_COMMUNITY): Payer: Medicare Other

## 2013-05-29 ENCOUNTER — Ambulatory Visit (HOSPITAL_COMMUNITY)
Admission: RE | Admit: 2013-05-29 | Discharge: 2013-05-29 | Disposition: A | Discharge status: Home / Self Care | Payer: Medicare Other | Source: Ambulatory Visit | Attending: Oncology | Admitting: Oncology

## 2013-05-29 DIAGNOSIS — I2510 Atherosclerotic heart disease of native coronary artery without angina pectoris: Secondary | ICD-10-CM

## 2013-05-29 DIAGNOSIS — I059 Rheumatic mitral valve disease, unspecified: Secondary | ICD-10-CM | POA: Insufficient documentation

## 2013-05-29 DIAGNOSIS — I079 Rheumatic tricuspid valve disease, unspecified: Secondary | ICD-10-CM | POA: Insufficient documentation

## 2013-05-29 DIAGNOSIS — M810 Age-related osteoporosis without current pathological fracture: Secondary | ICD-10-CM

## 2013-05-29 DIAGNOSIS — I359 Nonrheumatic aortic valve disorder, unspecified: Secondary | ICD-10-CM | POA: Insufficient documentation

## 2013-05-29 DIAGNOSIS — C50912 Malignant neoplasm of unspecified site of left female breast: Secondary | ICD-10-CM

## 2013-05-29 DIAGNOSIS — C50919 Malignant neoplasm of unspecified site of unspecified female breast: Secondary | ICD-10-CM | POA: Insufficient documentation

## 2013-05-29 DIAGNOSIS — D649 Anemia, unspecified: Secondary | ICD-10-CM

## 2013-05-29 NOTE — Progress Notes (Signed)
  Echocardiogram 2D Echocardiogram has been performed.  [name] 05/29/2013, 12:24 PM

## 2013-06-09 ENCOUNTER — Other Ambulatory Visit: Payer: Self-pay | Admitting: Physician Assistant

## 2013-06-09 DIAGNOSIS — C50912 Malignant neoplasm of unspecified site of left female breast: Secondary | ICD-10-CM

## 2013-06-10 ENCOUNTER — Ambulatory Visit (HOSPITAL_BASED_OUTPATIENT_CLINIC_OR_DEPARTMENT_OTHER): Payer: Medicare Other

## 2013-06-10 ENCOUNTER — Other Ambulatory Visit: Payer: Medicare Other

## 2013-06-10 ENCOUNTER — Other Ambulatory Visit (HOSPITAL_BASED_OUTPATIENT_CLINIC_OR_DEPARTMENT_OTHER): Payer: Medicare Other

## 2013-06-10 VITALS — BP 160/72 | HR 61 | Temp 97.6°F | Resp 18

## 2013-06-10 DIAGNOSIS — C44509 Unspecified malignant neoplasm of skin of other part of trunk: Secondary | ICD-10-CM

## 2013-06-10 DIAGNOSIS — Z5112 Encounter for antineoplastic immunotherapy: Secondary | ICD-10-CM

## 2013-06-10 DIAGNOSIS — C44599 Other specified malignant neoplasm of skin of other part of trunk: Secondary | ICD-10-CM

## 2013-06-10 DIAGNOSIS — C50912 Malignant neoplasm of unspecified site of left female breast: Secondary | ICD-10-CM

## 2013-06-10 LAB — CBC WITH DIFFERENTIAL/PLATELET
BASO%: 0.7 % (ref 0.0–2.0)
BASOS ABS: 0 10*3/uL (ref 0.0–0.1)
EOS%: 5.7 % (ref 0.0–7.0)
Eosinophils Absolute: 0.2 10*3/uL (ref 0.0–0.5)
HEMATOCRIT: 40.2 % (ref 34.8–46.6)
HGB: 13.1 g/dL (ref 11.6–15.9)
LYMPH%: 19.8 % (ref 14.0–49.7)
MCH: 30.1 pg (ref 25.1–34.0)
MCHC: 32.6 g/dL (ref 31.5–36.0)
MCV: 92.4 fL (ref 79.5–101.0)
MONO#: 0.5 10*3/uL (ref 0.1–0.9)
MONO%: 11.4 % (ref 0.0–14.0)
NEUT#: 2.6 10*3/uL (ref 1.5–6.5)
NEUT%: 62.4 % (ref 38.4–76.8)
Platelets: 140 10*3/uL — ABNORMAL LOW (ref 145–400)
RBC: 4.35 10*6/uL (ref 3.70–5.45)
RDW: 14.1 % (ref 11.2–14.5)
WBC: 4.2 10*3/uL (ref 3.9–10.3)
lymph#: 0.8 10*3/uL — ABNORMAL LOW (ref 0.9–3.3)

## 2013-06-10 LAB — COMPREHENSIVE METABOLIC PANEL (CC13)
ALK PHOS: 58 U/L (ref 40–150)
ALT: 16 U/L (ref 0–55)
AST: 32 U/L (ref 5–34)
Albumin: 4.2 g/dL (ref 3.5–5.0)
Anion Gap: 8 mEq/L (ref 3–11)
BUN: 15.6 mg/dL (ref 7.0–26.0)
CO2: 25 mEq/L (ref 22–29)
CREATININE: 0.8 mg/dL (ref 0.6–1.1)
Calcium: 10.2 mg/dL (ref 8.4–10.4)
Chloride: 109 mEq/L (ref 98–109)
Glucose: 91 mg/dl (ref 70–140)
Potassium: 4 mEq/L (ref 3.5–5.1)
Sodium: 142 mEq/L (ref 136–145)
Total Bilirubin: 0.63 mg/dL (ref 0.20–1.20)
Total Protein: 7.1 g/dL (ref 6.4–8.3)

## 2013-06-10 MED ORDER — ACETAMINOPHEN 325 MG PO TABS
ORAL_TABLET | ORAL | Status: AC
  Filled 2013-06-10: qty 2

## 2013-06-10 MED ORDER — TRASTUZUMAB CHEMO INJECTION 440 MG
6.0000 mg/kg | Freq: Once | INTRAVENOUS | Status: AC
  Administered 2013-06-10: 294 mg via INTRAVENOUS
  Filled 2013-06-10: qty 14

## 2013-06-10 MED ORDER — SODIUM CHLORIDE 0.9 % IV SOLN
Freq: Once | INTRAVENOUS | Status: AC
  Administered 2013-06-10: 13:00:00 via INTRAVENOUS

## 2013-06-10 MED ORDER — ACETAMINOPHEN 325 MG PO TABS
650.0000 mg | ORAL_TABLET | Freq: Once | ORAL | Status: AC
  Administered 2013-06-10: 650 mg via ORAL

## 2013-06-10 NOTE — Patient Instructions (Signed)
[facility] Discharge Instructions for Patients Receiving Chemotherapy  Today you received the following chemotherapy agent: Herceptin   To help prevent nausea and vomiting after your treatment, we encourage you to take your nausea medication as prescribed.    If you develop nausea and vomiting that is not controlled by your nausea medication, call the clinic.   BELOW ARE SYMPTOMS THAT SHOULD BE REPORTED IMMEDIATELY:  *FEVER GREATER THAN 100.5 F  *CHILLS WITH OR WITHOUT FEVER  NAUSEA AND VOMITING THAT IS NOT CONTROLLED WITH YOUR NAUSEA MEDICATION  *UNUSUAL SHORTNESS OF BREATH  *UNUSUAL BRUISING OR BLEEDING  TENDERNESS IN MOUTH AND THROAT WITH OR WITHOUT PRESENCE OF ULCERS  *URINARY PROBLEMS  *BOWEL PROBLEMS  UNUSUAL RASH Items with * indicate a potential emergency and should be followed up as soon as possible.  Feel free to call the clinic you have any questions or concerns. The clinic phone number is [phone].    

## 2013-06-22 ENCOUNTER — Ambulatory Visit: Payer: Medicare Other | Admitting: Oncology

## 2013-06-22 ENCOUNTER — Other Ambulatory Visit: Payer: Medicare Other | Admitting: Lab

## 2013-06-26 ENCOUNTER — Ambulatory Visit: Payer: Medicare Other | Admitting: Gynecology

## 2013-07-02 ENCOUNTER — Encounter (HOSPITAL_COMMUNITY): Payer: Self-pay | Admitting: Cardiology

## 2013-07-08 ENCOUNTER — Other Ambulatory Visit (HOSPITAL_COMMUNITY): Payer: Self-pay | Admitting: Cardiology

## 2013-07-08 ENCOUNTER — Other Ambulatory Visit (HOSPITAL_BASED_OUTPATIENT_CLINIC_OR_DEPARTMENT_OTHER): Payer: Medicare Other

## 2013-07-08 ENCOUNTER — Ambulatory Visit (HOSPITAL_BASED_OUTPATIENT_CLINIC_OR_DEPARTMENT_OTHER): Payer: Medicare Other

## 2013-07-08 VITALS — BP 144/85 | HR 61 | Temp 97.0°F | Resp 18

## 2013-07-08 DIAGNOSIS — Z5112 Encounter for antineoplastic immunotherapy: Secondary | ICD-10-CM

## 2013-07-08 DIAGNOSIS — C50912 Malignant neoplasm of unspecified site of left female breast: Secondary | ICD-10-CM

## 2013-07-08 DIAGNOSIS — C44599 Other specified malignant neoplasm of skin of other part of trunk: Secondary | ICD-10-CM

## 2013-07-08 DIAGNOSIS — C773 Secondary and unspecified malignant neoplasm of axilla and upper limb lymph nodes: Secondary | ICD-10-CM

## 2013-07-08 DIAGNOSIS — C44509 Unspecified malignant neoplasm of skin of other part of trunk: Secondary | ICD-10-CM

## 2013-07-08 DIAGNOSIS — I10 Essential (primary) hypertension: Secondary | ICD-10-CM

## 2013-07-08 DIAGNOSIS — C50519 Malignant neoplasm of lower-outer quadrant of unspecified female breast: Secondary | ICD-10-CM

## 2013-07-08 LAB — CBC WITH DIFFERENTIAL/PLATELET
BASO%: 0.7 % (ref 0.0–2.0)
Basophils Absolute: 0 10*3/uL (ref 0.0–0.1)
EOS ABS: 0.2 10*3/uL (ref 0.0–0.5)
EOS%: 4.5 % (ref 0.0–7.0)
HCT: 40.9 % (ref 34.8–46.6)
HEMOGLOBIN: 13.4 g/dL (ref 11.6–15.9)
LYMPH%: 15.1 % (ref 14.0–49.7)
MCH: 30.2 pg (ref 25.1–34.0)
MCHC: 32.8 g/dL (ref 31.5–36.0)
MCV: 92.3 fL (ref 79.5–101.0)
MONO#: 0.6 10*3/uL (ref 0.1–0.9)
MONO%: 11.7 % (ref 0.0–14.0)
NEUT%: 68 % (ref 38.4–76.8)
NEUTROS ABS: 3.7 10*3/uL (ref 1.5–6.5)
PLATELETS: 143 10*3/uL — AB (ref 145–400)
RBC: 4.43 10*6/uL (ref 3.70–5.45)
RDW: 14.4 % (ref 11.2–14.5)
WBC: 5.4 10*3/uL (ref 3.9–10.3)
lymph#: 0.8 10*3/uL — ABNORMAL LOW (ref 0.9–3.3)
nRBC: 0 % (ref 0–0)

## 2013-07-08 LAB — COMPREHENSIVE METABOLIC PANEL (CC13)
ALT: 13 U/L (ref 0–55)
ANION GAP: 8 meq/L (ref 3–11)
AST: 27 U/L (ref 5–34)
Albumin: 4 g/dL (ref 3.5–5.0)
Alkaline Phosphatase: 55 U/L (ref 40–150)
BILIRUBIN TOTAL: 0.43 mg/dL (ref 0.20–1.20)
BUN: 14.9 mg/dL (ref 7.0–26.0)
CALCIUM: 10 mg/dL (ref 8.4–10.4)
CO2: 24 meq/L (ref 22–29)
Chloride: 111 mEq/L — ABNORMAL HIGH (ref 98–109)
Creatinine: 0.8 mg/dL (ref 0.6–1.1)
Glucose: 90 mg/dl (ref 70–140)
Potassium: 4.2 mEq/L (ref 3.5–5.1)
SODIUM: 142 meq/L (ref 136–145)
TOTAL PROTEIN: 7.1 g/dL (ref 6.4–8.3)

## 2013-07-08 MED ORDER — ACETAMINOPHEN 325 MG PO TABS
650.0000 mg | ORAL_TABLET | Freq: Once | ORAL | Status: AC
  Administered 2013-07-08: 650 mg via ORAL

## 2013-07-08 MED ORDER — ACETAMINOPHEN 325 MG PO TABS
ORAL_TABLET | ORAL | Status: AC
  Filled 2013-07-08: qty 2

## 2013-07-08 MED ORDER — SODIUM CHLORIDE 0.9 % IV SOLN
6.0000 mg/kg | Freq: Once | INTRAVENOUS | Status: AC
  Administered 2013-07-08: 294 mg via INTRAVENOUS
  Filled 2013-07-08: qty 14

## 2013-07-08 MED ORDER — LOSARTAN POTASSIUM 25 MG PO TABS: 25.0000 mg | ORAL_TABLET | Freq: Every day | ORAL | Status: DC

## 2013-07-08 MED ORDER — SODIUM CHLORIDE 0.9 % IV SOLN
Freq: Once | INTRAVENOUS | Status: AC
  Administered 2013-07-08: 12:00:00 via INTRAVENOUS

## 2013-07-08 NOTE — Patient Instructions (Signed)
[facility] Discharge Instructions for Patients Receiving Chemotherapy  Today you received the following chemotherapy agents Herceptin.  To help prevent nausea and vomiting after your treatment, we encourage you to take your nausea medication as prescribed.   If you develop nausea and vomiting that is not controlled by your nausea medication, call the clinic.   BELOW ARE SYMPTOMS THAT SHOULD BE REPORTED IMMEDIATELY:  *FEVER GREATER THAN 100.5 F  *CHILLS WITH OR WITHOUT FEVER  NAUSEA AND VOMITING THAT IS NOT CONTROLLED WITH YOUR NAUSEA MEDICATION  *UNUSUAL SHORTNESS OF BREATH  *UNUSUAL BRUISING OR BLEEDING  TENDERNESS IN MOUTH AND THROAT WITH OR WITHOUT PRESENCE OF ULCERS  *URINARY PROBLEMS  *BOWEL PROBLEMS  UNUSUAL RASH Items with * indicate a potential emergency and should be followed up as soon as possible.  Feel free to call the clinic you have any questions or concerns. The clinic phone number is [phone].    

## 2013-07-10 ENCOUNTER — Encounter: Payer: Self-pay | Admitting: Gynecology

## 2013-07-10 ENCOUNTER — Ambulatory Visit: Payer: Medicare Other | Attending: Gynecology | Admitting: Gynecology

## 2013-07-10 VITALS — BP 185/86 | Temp 97.5°F | Wt 105.2 lb

## 2013-07-10 DIAGNOSIS — Z90710 Acquired absence of both cervix and uterus: Secondary | ICD-10-CM | POA: Insufficient documentation

## 2013-07-10 DIAGNOSIS — C519 Malignant neoplasm of vulva, unspecified: Secondary | ICD-10-CM | POA: Insufficient documentation

## 2013-07-10 DIAGNOSIS — Z79899 Other long term (current) drug therapy: Secondary | ICD-10-CM | POA: Insufficient documentation

## 2013-07-10 DIAGNOSIS — M889 Osteitis deformans of unspecified bone: Secondary | ICD-10-CM | POA: Insufficient documentation

## 2013-07-10 DIAGNOSIS — M810 Age-related osteoporosis without current pathological fracture: Secondary | ICD-10-CM | POA: Insufficient documentation

## 2013-07-10 DIAGNOSIS — H3530 Unspecified macular degeneration: Secondary | ICD-10-CM | POA: Insufficient documentation

## 2013-07-10 DIAGNOSIS — C4499 Other specified malignant neoplasm of skin, unspecified: Secondary | ICD-10-CM

## 2013-07-10 DIAGNOSIS — Z66 Do not resuscitate: Secondary | ICD-10-CM | POA: Insufficient documentation

## 2013-07-10 DIAGNOSIS — Z87891 Personal history of nicotine dependence: Secondary | ICD-10-CM | POA: Insufficient documentation

## 2013-07-10 NOTE — Patient Instructions (Signed)
Please cal our office if you have any questions or concerns.

## 2013-07-10 NOTE — Progress Notes (Signed)
Consult Note: Gyn-Onc   [name] 80 y.o. female  Chief Complaint  Patient presents with  . Pagets Disease    Assessment : Paget's disease of the vulva which is unchanged on clinical examination. The patient is very happy with clobetasol which is relieving all of her symptoms.  Plan: Continue clobetasol on a when necessary basis. Return to see me in one year or as needed.  Interval History: In November 2013 the patient was treated with clobetasol twice a day. Over the past year or so she's been using it only on a when necessary basis since he is very satisfied. On average she uses about twice a week. She reports her symptoms are nearly completely resolved. She has no new vulvar lesions.  HPI:80 year old white female initially seen in consultation request of Dr. Paula Compton regarding management of recurrent vulvar Paget's disease. The patient's history dates back to at least 2004 where she had symptomatic vulvar Paget's disease. She's had 2 surgical resections of lesions.  Recently she has noted more irritation of the left vulva. She saw Dr. Marvel Plan on October 21 , 2013 who obtained a biopsy of the left vulvar lesion showing recurrent Paget's disease. The patient's symptoms at the present time seem relatively mild other she describes this as "irritation". She denies any bleeding or any other vulvar pain.  At our initial visit November 2013 we instituted a medical management regimen using clobetasol which resulted in good relief of her symptoms.   Review of Systems:10 point review of systems is negative except as noted in interval history.   Vitals: Blood pressure 185/86, temperature 97.5 F (36.4 C), temperature source Oral, weight 105 lb 3.2 oz (47.718 kg).  Physical Exam: General : The patient is a healthy woman in no acute distress.  HEENT: normocephalic, extraoccular movements normal; neck is supple without thyromegally  Lynphnodes: Supraclavicular and inguinal nodes  not enlarged  Abdomen: Soft, non-tender, no ascites, no organomegally, no masses, no hernias  Pelvic:  EGBUS: Normal female . There is a 4 x 4 centimeter area of erythematous skin on the left anterior vulva which is unchanged from prior visit. Vagina: Normal, no lesions atrophic Urethra and Bladder: Normal, non-tender  Cervix: Surgically absent  Uterus: Surgically absent  Bi-manual examination: Non-tender; no adenxal masses or nodularity  Rectal: normal sphincter tone, no masses, no blood  Lower extremities: No edema or varicosities. Normal range of motion      Allergies  Allergen Reactions  . Solifenacin Succinate     REACTION: "sick" nausea    Past Medical History  Diagnosis Date  . Cancer     breast  . Cataract   . CAD (coronary artery disease)     cath w/ trivial disease  . Diverticulosis of colon   . Osteoporosis     s/p compression fx T12  . Macular degeneration   . Breast cancer     Past Surgical History  Procedure Laterality Date  . Abdominal hysterectomy    . Breast lumpectomy    . Excision for paget's vulvar    . Vertbroplasty      T12  . Mastectomy      left  . Flexible sigmoidoscopy N/A 06/27/2012    Procedure: FLEXIBLE SIGMOIDOSCOPY;  Surgeon: Lafayette Dragon, MD;  Location: [facility];  Service: Endoscopy;  Laterality: N/A;    Current Outpatient Prescriptions  Medication Sig Dispense Refill  . acetaminophen (TYLENOL) 500 MG tablet Take 500 mg by mouth daily as needed.       Marland Kitchen  Ascorbic Acid (VITAMIN C) 100 MG tablet Take 100 mg by mouth daily.        . cholecalciferol (VITAMIN D) 1000 UNITS tablet Take 1,000 Units by mouth daily.      . clobetasol ointment (TEMOVATE) 7.10 % Apply 1 application topically as needed.       . fish oil-omega-3 fatty acids 1000 MG capsule Take 2 g by mouth daily.        Marland Kitchen losartan (COZAAR) 25 MG tablet Take 1 tablet (25 mg total) by mouth daily.  90 tablet  3  . Multiple Vitamin (MULTIVITAMIN PO) Take 1 tablet by mouth  daily.       . Multiple Vitamins-Minerals (ICAPS PO) Take 1 tablet by mouth 2 (two) times daily.       . trastuzumab (HERCEPTIN) 440 MG injection Inject into the vein once.        . zolendronic acid (ZOMETA) 4 MG/5ML injection Inject 4 mg into the vein every 3 (three) months.         No current facility-administered medications for this visit.    History   Social History  . Marital Status: Widowed    Spouse Name: N/A    Number of Children: N/A  . Years of Education: N/A   Occupational History  . Not on file.   Social History Main Topics  . Smoking status: Former Smoker    Quit date: 05/01/1990  . Smokeless tobacco: Never Used  . Alcohol Use: No  . Drug Use: No  . Sexual Activity: No   Other Topics Concern  . Not on file   Social History Narrative   HSG, no college. Married '44- '97 widowed. 2 dtrs - '46, '54, 1 son - '49; 6 grandchildren; 5 great-grands. 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 She lives alone.    End of life care: Do not resuscitate; willing to have intensive care excluding mechanical ventilation.     Family History  Problem Relation Age of Onset  . Cancer Sister   . Hypertension Mother       Alvino Chapel, MD 07/10/2013, 1:18 PM

## 2013-07-14 ENCOUNTER — Ambulatory Visit (INDEPENDENT_AMBULATORY_CARE_PROVIDER_SITE_OTHER): Payer: Medicare Other | Admitting: Family Medicine

## 2013-07-14 ENCOUNTER — Encounter: Payer: Self-pay | Admitting: Family Medicine

## 2013-07-14 VITALS — BP 112/64 | HR 98

## 2013-07-14 DIAGNOSIS — I2510 Atherosclerotic heart disease of native coronary artery without angina pectoris: Secondary | ICD-10-CM

## 2013-07-14 DIAGNOSIS — S2341XA Sprain of ribs, initial encounter: Secondary | ICD-10-CM

## 2013-07-14 DIAGNOSIS — S299XXA Unspecified injury of thorax, initial encounter: Secondary | ICD-10-CM | POA: Insufficient documentation

## 2013-07-14 NOTE — Progress Notes (Signed)
  [facility] [address] [place], [place] [address] Phone: [phone] Subjective:      CC: right sided rib pain.   [id number] [name] is a 80 y.o. female coming in with complaint of right-sided rib pain. Patient fell approximately 1 week ago. Patient does have past medical history significant for osteoporosis and a compression fracture previously as well as breast cancer. Patient is on Herceptin and is taking vitamin D fairly regularly. Patient states initially she did not have any trouble but over the course of time she's been having more and more discomfort with breathing deeply. Patient states she breathe normally she does okay but unfortunately she takes a deep breath she it catches her. Patient denies any cough, any trouble breathing, or any fevers or chills. Patient states the pain is 8/10 in severity. Patient states that she did sleep well at night. Patient is accompanied with her son.     Past medical history, social, surgical and family history all reviewed in electronic medical record.   Review of Systems: No headache, visual changes, nausea, vomiting, diarrhea, constipation, dizziness, abdominal pain, skin rash, fevers, chills, night sweats, weight loss, swollen lymph nodes, body aches, joint swelling, muscle aches, chest pain, shortness of breath, mood changes.   Objective Blood pressure 112/64, pulse 98, SpO2 94.00%.  General: No apparent distress alert and oriented x3 mood and affect normal, dressed appropriately. Patient is a frail individual but does appears stated age 21: Pupils equal, extraocular movements intact  Respiratory: Patient's speak in full sentences and does not appear short of breath patient can take a deep breath but can tell that it is uncomfortable on the right side. Cardiovascular: No lower extremity edema, non tender, no erythema  Skin: Warm dry intact with no signs of infection or rash on extremities or on  axial skeleton.  Abdomen: Soft nontender  Neuro: Cranial nerves II through XII are intact, neurovascularly intact in all extremities with 2+ DTRs and 2+ pulses.  Lymph: No lymphadenopathy of posterior or anterior cervical chain or axillae bilaterally.  Gait normal with good balance and coordination.  MSK:  Non tender with full range of motion and good stability and symmetric strength and tone of shoulders, elbows, wrist, hip, knee and ankles bilaterally. She does have osteoarthritic changes in multiple joints  Patient's rib exam shows that she does have some mild discomfort to palpation over the lateral rib angle mostly of the T7-T9 ribs. No crepitus noted. No overlying bruising noted.   Impression and Recommendations:     This case required medical decision making of moderate complexity.

## 2013-07-14 NOTE — Assessment & Plan Note (Signed)
Anything patient has more of a rib contusion We did discuss x-rays and I thought that this could be beneficial the patient's past medical history of cancer as well as Paget's disease but patient sun declined today. Patient was given a rib belt to help with pain as well as some topical anti-inflammatory. Patient will try this for the next week. We discussed taking deep breaths to avoid any type of pneumonia and the importance of this. We discussed over-the-counter medications including increasing her vitamin D level. Patient will return again in 2 weeks with primary care provider for further evaluation.

## 2013-07-14 NOTE — Patient Instructions (Addendum)
Very nice to meet you Try the topical 2 times a day for the next week Wear the brace when up and about Ice10 minutes 2 times a day can help Increase vitamin D to 2000 IU daily Turmeric 500mg  twice daily can help as well.  If trouble with cough please call and I will send in medicine Come back in 2-3 weeks if having pain still

## 2013-07-24 ENCOUNTER — Other Ambulatory Visit (HOSPITAL_COMMUNITY): Payer: Self-pay | Admitting: Cardiology

## 2013-07-24 DIAGNOSIS — C50919 Malignant neoplasm of unspecified site of unspecified female breast: Secondary | ICD-10-CM

## 2013-07-28 ENCOUNTER — Emergency Department (HOSPITAL_COMMUNITY)
Admission: EM | Admit: 2013-07-28 | Discharge: 2013-07-28 | Disposition: A | Discharge status: Home / Self Care | Payer: Medicare Other | Attending: Emergency Medicine | Admitting: Emergency Medicine

## 2013-07-28 ENCOUNTER — Emergency Department (HOSPITAL_COMMUNITY): Payer: Medicare Other

## 2013-07-28 ENCOUNTER — Encounter (HOSPITAL_COMMUNITY): Payer: Self-pay | Admitting: Emergency Medicine

## 2013-07-28 DIAGNOSIS — Z9889 Other specified postprocedural states: Secondary | ICD-10-CM | POA: Insufficient documentation

## 2013-07-28 DIAGNOSIS — Y929 Unspecified place or not applicable: Secondary | ICD-10-CM | POA: Insufficient documentation

## 2013-07-28 DIAGNOSIS — S299XXA Unspecified injury of thorax, initial encounter: Secondary | ICD-10-CM

## 2013-07-28 DIAGNOSIS — Z8719 Personal history of other diseases of the digestive system: Secondary | ICD-10-CM | POA: Insufficient documentation

## 2013-07-28 DIAGNOSIS — S3981XA Other specified injuries of abdomen, initial encounter: Secondary | ICD-10-CM | POA: Insufficient documentation

## 2013-07-28 DIAGNOSIS — M810 Age-related osteoporosis without current pathological fracture: Secondary | ICD-10-CM | POA: Insufficient documentation

## 2013-07-28 DIAGNOSIS — Y9389 Activity, other specified: Secondary | ICD-10-CM | POA: Insufficient documentation

## 2013-07-28 DIAGNOSIS — R296 Repeated falls: Secondary | ICD-10-CM | POA: Insufficient documentation

## 2013-07-28 DIAGNOSIS — S298XXA Other specified injuries of thorax, initial encounter: Secondary | ICD-10-CM | POA: Insufficient documentation

## 2013-07-28 DIAGNOSIS — H269 Unspecified cataract: Secondary | ICD-10-CM | POA: Insufficient documentation

## 2013-07-28 DIAGNOSIS — Z90710 Acquired absence of both cervix and uterus: Secondary | ICD-10-CM | POA: Insufficient documentation

## 2013-07-28 DIAGNOSIS — Z87891 Personal history of nicotine dependence: Secondary | ICD-10-CM | POA: Insufficient documentation

## 2013-07-28 DIAGNOSIS — I2510 Atherosclerotic heart disease of native coronary artery without angina pectoris: Secondary | ICD-10-CM | POA: Insufficient documentation

## 2013-07-28 DIAGNOSIS — Z79899 Other long term (current) drug therapy: Secondary | ICD-10-CM | POA: Insufficient documentation

## 2013-07-28 DIAGNOSIS — Z853 Personal history of malignant neoplasm of breast: Secondary | ICD-10-CM | POA: Insufficient documentation

## 2013-07-28 MED ORDER — TRAMADOL HCL 50 MG PO TABS: 50.0000 mg | ORAL_TABLET | Freq: Four times a day (QID) | ORAL | Status: DC | PRN

## 2013-07-28 MED ORDER — TRAMADOL HCL 50 MG PO TABS
50.0000 mg | ORAL_TABLET | Freq: Once | ORAL | Status: AC
  Administered 2013-07-28: 50 mg via ORAL
  Filled 2013-07-28: qty 1

## 2013-07-28 MED ORDER — HYDROCODONE-ACETAMINOPHEN 5-325 MG PO TABS: 0.5000 | ORAL_TABLET | Freq: Four times a day (QID) | ORAL | Status: DC | PRN

## 2013-07-28 NOTE — ED Notes (Signed)
Bed: [facility] Expected date:  Expected time:  Means of arrival:  Comments: EMS back pain 

## 2013-07-28 NOTE — Progress Notes (Signed)
   CARE MANAGEMENT ED NOTE 07/28/2013  Patient:  [name], [name]   Account Number:  [id number]  Date Initiated:  07/28/2013  Documentation initiated by:  Livia Snellen  Subjective/Objective Assessment:   patient presents to Ed with post fall three weeks ago now with pain in right rib cage radiating to right leg.     Subjective/Objective Assessment Detail:     Action/Plan:   Action/Plan Detail:   Anticipated DC Date:       Status Recommendation to Physician:   Result of Recommendation:    Other ED Services  Consult Working [place]  Other  CM consult    Choice offered to / List presented to:  C-4 Adult Children  DME arranged  [name]     DME agency  [place]    Status of service:    ED Comments:   ED Comments Detail:  [facility] consulted by EDP for possible home health services. EDCM spoke to patient and patient's son [name] phone number [phone] and (c) [phone] and daughter in law [name].  Patient lives alone, patient does not have any home health services at this time nor does she have any medical equipment at home.  EDCM provided patient's son with list of home health agencies in [facility] and a list of private duty nursing.  EDCM explained with home health, patient may receive a visiting RN, PT, OT, aide and social worker if needed.  EDCM also explained that private duty nursing services would be an out of pocket expense for her. EDCM explained that home health agency of their choice have 24-48 hours to contact the patient.  Patient's son reports he heard good things about [place].  [name] selected as home health agency if patient is discharged.  Patient reports she has been having a difficult time getting up and down from her chair at home.  EDCM suggested a walker to help with ambulation and stability.  Patient agreeable to receiving a walker.  EDCM called [name], dme represetative  for advanced home care who will deliver walker to patient's room.  Discussed patient with EDP. Awaiting home health orders if patient to be discharged.

## 2013-07-28 NOTE — ED Provider Notes (Signed)
CSN: [id number]     Arrival date & time 07/28/13  1311 History   First MD Initiated Contact with Patient 07/28/13 1505     Chief Complaint  Patient presents with  . Flank Pain     (Consider location/radiation/quality/duration/timing/severity/associated sxs/prior Treatment) Patient is a 80 y.o. female presenting with flank pain. The history is provided by the patient and a relative.  Flank Pain Associated symptoms include chest pain. Pertinent negatives include no abdominal pain, no headaches and no shortness of breath.   patient fell 3 weeks ago. She landed on to a dog bite on her right side. At that time she had some right chest right hip and right leg pain. She's been feeling much better, until a couple days ago when she began to have pain in her right flank/back with breathing and certain movements. She states twisted and trying to get off the bathroom is very severe. No trouble breathing, but does have pain with breathing. No dysuria. No numbness or weakness, however she states that having difficulty walking due to the pain. No relief with Tylenol. She lives independently.  Past Medical History  Diagnosis Date  . Cancer     breast  . Cataract   . CAD (coronary artery disease)     cath w/ trivial disease  . Diverticulosis of colon   . Osteoporosis     s/p compression fx T12  . Macular degeneration   . Breast cancer    Past Surgical History  Procedure Laterality Date  . Abdominal hysterectomy    . Breast lumpectomy    . Excision for paget's vulvar    . Vertbroplasty      T12  . Mastectomy      left  . Flexible sigmoidoscopy N/A 06/27/2012    Procedure: FLEXIBLE SIGMOIDOSCOPY;  Surgeon: Lafayette Dragon, MD;  Location: [facility];  Service: Endoscopy;  Laterality: N/A;   Family History  Problem Relation Age of Onset  . Cancer Sister   . Hypertension Mother    History  Substance Use Topics  . Smoking status: Former Smoker    Quit date: 05/01/1990  . Smokeless tobacco:  Never Used  . Alcohol Use: No   OB History   Grav Para Term Preterm Abortions TAB SAB Ect Mult Living                 Review of Systems  Constitutional: Negative for activity change and appetite change.  Eyes: Negative for pain.  Respiratory: Negative for chest tightness and shortness of breath.   Cardiovascular: Positive for chest pain. Negative for leg swelling.  Gastrointestinal: Negative for nausea, vomiting, abdominal pain and diarrhea.  Genitourinary: Positive for flank pain.  Musculoskeletal: Negative for back pain and neck stiffness.  Skin: Negative for rash.  Neurological: Negative for weakness, numbness and headaches.  Psychiatric/Behavioral: Negative for behavioral problems.      Allergies  Solifenacin succinate  Home Medications   Current Outpatient Rx  Name  Route  Sig  Dispense  Refill  . acetaminophen (TYLENOL) 500 MG tablet   Oral   Take 1,000 mg by mouth daily.          . Ascorbic Acid (VITAMIN C) 100 MG tablet   Oral   Take 100 mg by mouth daily.           . beta carotene w/minerals (OCUVITE) tablet   Oral   Take 1 tablet by mouth daily.         . cholecalciferol (  VITAMIN D) 1000 UNITS tablet   Oral   Take 1,000 Units by mouth daily.         . fish oil-omega-3 fatty acids 1000 MG capsule   Oral   Take 2 g by mouth daily.           Marland Kitchen losartan (COZAAR) 25 MG tablet   Oral   Take 1 tablet (25 mg total) by mouth daily.   90 tablet   3   . polyvinyl alcohol (LIQUIFILM TEARS) 1.4 % ophthalmic solution   Both Eyes   Place 2 drops into both eyes as needed for dry eyes.         . trastuzumab (HERCEPTIN) 440 MG injection   Intravenous   Inject into the vein once.           . zolendronic acid (ZOMETA) 4 MG/5ML injection   Intravenous   Inject 4 mg into the vein every 3 (three) months.           Marland Kitchen HYDROcodone-acetaminophen (NORCO/VICODIN) 5-325 MG per tablet   Oral   Take 0.5-1 tablets by mouth every 6 (six) hours as needed  for moderate pain.   10 tablet   0   . traMADol (ULTRAM) 50 MG tablet   Oral   Take 1 tablet (50 mg total) by mouth every 6 (six) hours as needed.   15 tablet   0    BP 188/81  Pulse 60  Temp(Src) 97.5 F (36.4 C) (Oral)  Resp 18  SpO2 100% Physical Exam  Constitutional: She is oriented to person, place, and time. She appears well-developed.  HENT:  Head: Normocephalic.  Eyes: Pupils are equal, round, and reactive to light.  Neck: Normal range of motion. Neck supple.  Cardiovascular: Normal rate and regular rhythm.   Pulmonary/Chest: Effort normal. She exhibits tenderness.  A patient ispoint tender in the right CVA area. No crepitance or deformity. Lungs are clear.  Abdominal: Soft. There is no tenderness.  Musculoskeletal: She exhibits no tenderness.  Neurological: She is alert and oriented to person, place, and time.  Skin: Skin is warm.    ED Course  Procedures (including critical care time) Labs Review Labs Reviewed - No data to display Imaging Review Dg Ribs Unilateral W/chest Right  07/28/2013   CLINICAL DATA:  Fall.  Right rib pain  EXAM: RIGHT RIBS AND CHEST - 3+ VIEW  COMPARISON:  11/20/2012  FINDINGS: No fracture or other bone lesions are seen involving the ribs. There is no evidence of pneumothorax or pleural effusion. Both lungs are clear. Heart size and mediastinal contours are within normal limits.  Dextroscoliosis of the thoracic spine is unchanged. Surgical clip left axilla  IMPRESSION: Negative.   Electronically Signed   By: Franchot Gallo M.D.   On: 07/28/2013 16:22     EKG Interpretation None      MDM   Final diagnoses:  Rib injury    Patient with fall a few weeks ago. Point tenderness to right rib area posteriorly. Pain worse with movement. X-ray does not show fracture, however clinically there is a rib fracture. She feels somewhat better after Ultram. She has pain, but she is able  to ambulate with a walker.  Case management has been involved  and has arranged for home health evaluation. Also given prescription for Norco to try if Ultram does not work well. She'll take this medicine with other family members around that she's not been on strong narcotics previously. Will discharge home.  Jasper Riling. Alvino Chapel, MD 07/28/13 [phone]

## 2013-07-28 NOTE — Discharge Instructions (Signed)
Rib Fracture  A rib fracture is a break or crack in one of the bones of the ribs. The ribs are a group of long, curved bones that wrap around your chest and attach to your spine. They protect your lungs and other organs in the chest cavity. A broken or cracked rib is often painful, but most do not cause other problems. Most rib fractures heal on their own over time. However, rib fractures can be more serious if multiple ribs are broken or if broken ribs move out of place and push against other structures.  CAUSES   · A direct blow to the chest. For example, this could happen during contact sports, a car accident, or a fall against a hard object.  · Repetitive movements with high force, such as pitching a baseball or having severe coughing spells.  SYMPTOMS   · Pain when you breathe in or cough.  · Pain when someone presses on the injured area.  DIAGNOSIS   Your caregiver will perform a physical exam. Various imaging tests may be ordered to confirm the diagnosis and to look for related injuries. These tests may include a chest X-ray, computed tomography (CT), magnetic resonance imaging (MRI), or a bone scan.  TREATMENT   Rib fractures usually heal on their own in 1 3 months. The longer healing period is often associated with a continued cough or other aggravating activities. During the healing period, pain control is very important. Medication is usually given to control pain. Hospitalization or surgery may be needed for more severe injuries, such as those in which multiple ribs are broken or the ribs have moved out of place.   HOME CARE INSTRUCTIONS   · Avoid strenuous activity and any activities or movements that cause pain. Be careful during activities and avoid bumping the injured rib.  · Gradually increase activity as directed by your caregiver.  · Only take over-the-counter or prescription medications as directed by your caregiver. Do not take other medications without asking your caregiver first.  · Apply ice  to the injured area for the first 1 2 days after you have been treated or as directed by your caregiver. Applying ice helps to reduce inflammation and pain.  · Put ice in a plastic bag.  · Place a towel between your skin and the bag.    · Leave the ice on for 15 20 minutes at a time, every 2 hours while you are awake.  · Perform deep breathing as directed by your caregiver. This will help prevent pneumonia, which is a common complication of a broken rib. Your caregiver may instruct you to:  · Take deep breaths several times a day.  · Try to cough several times a day, holding a pillow against the injured area.  · Use a device called an incentive spirometer to practice deep breathing several times a day.  · Drink enough fluids to keep your urine clear or pale yellow. This will help you avoid constipation.    · Do not wear a rib belt or binder. These restrict breathing, which can lead to pneumonia.    SEEK IMMEDIATE MEDICAL CARE IF:   · You have a fever.    · You have difficulty breathing or shortness of breath.    · You develop a continual cough, or you cough up thick or bloody sputum.  · You feel sick to your stomach (nausea), throw up (vomit), or have abdominal pain.    · You have worsening pain not controlled with medications.      MAKE SURE YOU:  · Understand these instructions.  · Will watch your condition.  · Will get help right away if you are not doing well or get worse.  Document Released: 04/16/2005 Document Revised: 12/17/2012 Document Reviewed: 06/18/2012  [employer]® Patient Information ©2014 [employer].

## 2013-07-28 NOTE — ED Notes (Signed)
Pt able to ambulate with walker well. Still feels pain when twisting and bending. Dr. Alvino Chapel made aware.

## 2013-07-28 NOTE — ED Notes (Signed)
Per EMS, pt fell 3 weeks ago and has had intermittent 8/10 pain in her right rib cage, radiating to her right leg, which worsens when she rotates her upper torso. Pt is alert and oriented.

## 2013-07-28 NOTE — Progress Notes (Signed)
07/28/2013 A. Nakina Spatz RNCM 1834pm Patient to be discharged home.  Walker was delivered to patient's bedisde.  [facility] faxed home health orders, face to face and patient information to [place] at [phone] at 1825pm with confirmaiton of receipt at 1828pm.  [facility] also place referral into [facility], patient and patient's family agreeable to referral.  Also provided patient's family with printed information on the [employer] of [place].  Patient and patient's family thankful for services.  No further EDCM needs at this time.

## 2013-07-29 ENCOUNTER — Telehealth: Payer: Self-pay | Admitting: *Deleted

## 2013-07-29 DIAGNOSIS — M889 Osteitis deformans of unspecified bone: Secondary | ICD-10-CM

## 2013-07-29 DIAGNOSIS — M810 Age-related osteoporosis without current pathological fracture: Secondary | ICD-10-CM

## 2013-07-29 DIAGNOSIS — S299XXA Unspecified injury of thorax, initial encounter: Secondary | ICD-10-CM

## 2013-07-29 DIAGNOSIS — R0789 Other chest pain: Secondary | ICD-10-CM

## 2013-07-29 NOTE — Telephone Encounter (Signed)
See "visit dx" here: chest wall pain, rib pain, Paget's disease, osteoporosis

## 2013-07-29 NOTE — Telephone Encounter (Signed)
Left msg on vm stating pt was d/c from hosp. They recieived orders for PT, OT, RN, and SW but did not specify reason. Requesting verbal orders with specific reason on why pt needing these services...[name]

## 2013-07-30 ENCOUNTER — Telehealth: Payer: Self-pay | Admitting: *Deleted

## 2013-07-30 ENCOUNTER — Ambulatory Visit (HOSPITAL_COMMUNITY): Payer: Medicare Other

## 2013-07-30 ENCOUNTER — Encounter (HOSPITAL_COMMUNITY): Payer: Medicare Other

## 2013-07-30 MED ORDER — TRAMADOL HCL 50 MG PO TABS
50.0000 mg | ORAL_TABLET | Freq: Four times a day (QID) | ORAL | Status: DC | PRN
Start: 2013-07-30 — End: 2013-08-06

## 2013-07-30 NOTE — Telephone Encounter (Signed)
Tom, pts son, called requesting Tramadol refill until pts appoint on 4.9.15 with MD.  He further states she is still having right side pain.  Please advise

## 2013-07-30 NOTE — Telephone Encounter (Signed)
Ok on tramadol Use heating pad as needed in addition to tramadol and tylenol

## 2013-07-30 NOTE — Telephone Encounter (Signed)
Notified Debbie gave her md response...Johny Chess

## 2013-07-30 NOTE — Telephone Encounter (Signed)
Spoke with [name] advised Rx sent

## 2013-08-05 ENCOUNTER — Ambulatory Visit: Payer: Medicare Other | Admitting: Physician Assistant

## 2013-08-05 ENCOUNTER — Ambulatory Visit: Payer: Medicare Other

## 2013-08-05 ENCOUNTER — Other Ambulatory Visit: Payer: Self-pay

## 2013-08-05 ENCOUNTER — Other Ambulatory Visit: Payer: Medicare Other

## 2013-08-06 ENCOUNTER — Encounter: Payer: Self-pay | Admitting: Internal Medicine

## 2013-08-06 ENCOUNTER — Ambulatory Visit (INDEPENDENT_AMBULATORY_CARE_PROVIDER_SITE_OTHER): Payer: Medicare Other | Admitting: Internal Medicine

## 2013-08-06 VITALS — BP 124/82 | HR 62 | Temp 96.9°F | Wt 105.0 lb

## 2013-08-06 DIAGNOSIS — S2341XA Sprain of ribs, initial encounter: Secondary | ICD-10-CM

## 2013-08-06 DIAGNOSIS — S299XXA Unspecified injury of thorax, initial encounter: Secondary | ICD-10-CM

## 2013-08-06 DIAGNOSIS — I2510 Atherosclerotic heart disease of native coronary artery without angina pectoris: Secondary | ICD-10-CM

## 2013-08-06 DIAGNOSIS — R32 Unspecified urinary incontinence: Secondary | ICD-10-CM | POA: Insufficient documentation

## 2013-08-06 DIAGNOSIS — I10 Essential (primary) hypertension: Secondary | ICD-10-CM

## 2013-08-06 MED ORDER — TRAMADOL HCL 50 MG PO TABS: 50.0000 mg | ORAL_TABLET | Freq: Four times a day (QID) | ORAL | Status: DC | PRN

## 2013-08-06 NOTE — Assessment & Plan Note (Signed)
Chronic issue Suspect related to prolapse as described on history, but exam not performed today Will refer to gynecology, for discussion of limited intervention available given age and poor suitability for surgical repair Prior antispasmodic trials for OAB such as Detrol and Vesicare ineffective per family report No medication changes recommended

## 2013-08-06 NOTE — Assessment & Plan Note (Signed)
BP Readings from Last 3 Encounters:  08/06/13 124/82  07/28/13 188/81  07/14/13 112/64   The current medical regimen is effective;  continue present plan and medications.

## 2013-08-06 NOTE — Progress Notes (Signed)
Subjective:    Patient ID: [name], female    DOB: [birth year], 80 y.o.   MRN: [id number]  HPI  Patient is here for follow up  Reviewed chronic medical issues and interval medical events  Past Medical History  Diagnosis Date  . Cancer     breast  . Cataract   . CAD (coronary artery disease)     cath w/ trivial disease  . Diverticulosis of colon   . Osteoporosis     s/p compression fx T12  . Macular degeneration   . Breast cancer     Review of Systems  Constitutional: Negative for fever and fatigue.  Respiratory: Negative for cough and shortness of breath.   Gastrointestinal: Negative for diarrhea and constipation.  Genitourinary: Positive for urgency, frequency and difficulty urinating. Negative for dysuria, flank pain and pelvic pain.  Musculoskeletal: Positive for back pain (posterior R rib cage, improved).       Objective:   Physical Exam  BP 124/82  Pulse 62  Temp(Src) 96.9 F (36.1 C) (Oral)  Wt 105 lb (47.628 kg)  SpO2 97% Wt Readings from Last 3 Encounters:  08/06/13 105 lb (47.628 kg)  07/10/13 105 lb 3.2 oz (47.718 kg)  05/18/13 106 lb 3.2 oz (48.172 kg)    Constitutional: She appears well-developed and well-nourished. No distress.  Neck: Normal range of motion. Neck supple. No JVD present. No thyromegaly present.  Cardiovascular: Normal rate, regular rhythm and normal heart sounds.  No murmur heard. No BLE edema. Pulmonary/Chest: Effort normal and breath sounds normal. No respiratory distress. She has no wheezes.  Psychiatric: She has a normal mood and affect. Her behavior is normal. Judgment and thought content normal.   Lab Results  Component Value Date   WBC 5.4 07/08/2013   HGB 13.4 07/08/2013   HCT 40.9 07/08/2013   PLT 143* 07/08/2013   GLUCOSE 90 07/08/2013   ALT 13 07/08/2013   AST 27 07/08/2013   NA 142 07/08/2013   K 4.2 07/08/2013   CL 106 10/15/2012   CREATININE 0.8 07/08/2013   BUN 14.9 07/08/2013   CO2 24 07/08/2013   TSH  1.082 01/25/2010    Dg Ribs Unilateral W/chest Right  07/28/2013   CLINICAL DATA:  Fall.  Right rib pain  EXAM: RIGHT RIBS AND CHEST - 3+ VIEW  COMPARISON:  11/20/2012  FINDINGS: No fracture or other bone lesions are seen involving the ribs. There is no evidence of pneumothorax or pleural effusion. Both lungs are clear. Heart size and mediastinal contours are within normal limits.  Dextroscoliosis of the thoracic spine is unchanged. Surgical clip left axilla  IMPRESSION: Negative.   Electronically Signed   By: Franchot Gallo M.D.   On: 07/28/2013 16:22       Assessment & Plan:   Problem List Items Addressed This Visit   Hypertension      BP Readings from Last 3 Encounters:  08/06/13 124/82  07/28/13 188/81  07/14/13 112/64   The current medical regimen is effective;  continue present plan and medications.     Rib injury - Primary     Fall early 06/2013 - no fracture on evaluation  ER visit for uncontrolled pain also reviewed Currently, pain improving with tramadol prn and HH PT/OT Interval history reviewed, no changes recommended today    Urinary incontinence     Chronic issue Suspect related to prolapse as described on history, but exam not performed today Will refer to gynecology, for discussion of  limited intervention available given age and poor suitability for surgical repair Prior antispasmodic trials for OAB such as Detrol and Vesicare ineffective per family report No medication changes recommended      Time spent with pt/family today 25 minutes, greater than 50% time spent counseling patient on fall with rib contusion, chronic ur incontinence and medication review. Also review of prior records

## 2013-08-06 NOTE — Progress Notes (Signed)
Pre visit review using our clinic review tool, if applicable. No additional management support is needed unless otherwise documented below in the visit note. 

## 2013-08-06 NOTE — Assessment & Plan Note (Signed)
Fall early 06/2013 - no fracture on evaluation  ER visit for uncontrolled pain also reviewed Currently, pain improving with tramadol prn and HH PT/OT Interval history reviewed, no changes recommended today

## 2013-08-06 NOTE — Patient Instructions (Signed)
It was good to see you today.  We have reviewed your prior records including labs and tests today  Medications reviewed and updated, no changes recommended at this time. Refill on medication(s) as discussed today.  we'll make referral to gynecology. Our office will contact you regarding appointment(s) once made.  Please schedule followup in 3-4 months, call sooner if problems.

## 2013-08-07 ENCOUNTER — Telehealth: Payer: Self-pay | Admitting: Internal Medicine

## 2013-08-07 NOTE — Telephone Encounter (Signed)
Relevant patient education mailed to patient.  

## 2013-08-17 ENCOUNTER — Telehealth: Payer: Self-pay | Admitting: *Deleted

## 2013-08-17 ENCOUNTER — Other Ambulatory Visit: Payer: Self-pay | Admitting: Physician Assistant

## 2013-08-17 ENCOUNTER — Telehealth: Payer: Self-pay | Admitting: Oncology

## 2013-08-17 DIAGNOSIS — M810 Age-related osteoporosis without current pathological fracture: Secondary | ICD-10-CM

## 2013-08-17 DIAGNOSIS — R269 Unspecified abnormalities of gait and mobility: Secondary | ICD-10-CM

## 2013-08-17 DIAGNOSIS — Z9181 History of falling: Secondary | ICD-10-CM

## 2013-08-17 DIAGNOSIS — R079 Chest pain, unspecified: Secondary | ICD-10-CM

## 2013-08-17 DIAGNOSIS — I2510 Atherosclerotic heart disease of native coronary artery without angina pectoris: Secondary | ICD-10-CM

## 2013-08-17 NOTE — Telephone Encounter (Signed)
Per staff message and POF I have scheduled appts.  JMW  

## 2013-08-17 NOTE — Telephone Encounter (Signed)
, °

## 2013-08-18 ENCOUNTER — Telehealth: Payer: Self-pay | Admitting: Oncology

## 2013-08-18 ENCOUNTER — Ambulatory Visit (HOSPITAL_BASED_OUTPATIENT_CLINIC_OR_DEPARTMENT_OTHER): Payer: Medicare Other

## 2013-08-18 ENCOUNTER — Ambulatory Visit (HOSPITAL_BASED_OUTPATIENT_CLINIC_OR_DEPARTMENT_OTHER): Payer: Medicare Other | Admitting: Oncology

## 2013-08-18 ENCOUNTER — Other Ambulatory Visit (HOSPITAL_BASED_OUTPATIENT_CLINIC_OR_DEPARTMENT_OTHER): Payer: Medicare Other

## 2013-08-18 ENCOUNTER — Telehealth: Payer: Self-pay | Admitting: Physician Assistant

## 2013-08-18 ENCOUNTER — Other Ambulatory Visit: Payer: Self-pay | Admitting: *Deleted

## 2013-08-18 VITALS — BP 168/83 | HR 73 | Temp 97.6°F | Resp 18 | Ht 61.46 in | Wt 103.4 lb

## 2013-08-18 DIAGNOSIS — C50912 Malignant neoplasm of unspecified site of left female breast: Secondary | ICD-10-CM

## 2013-08-18 DIAGNOSIS — C779 Secondary and unspecified malignant neoplasm of lymph node, unspecified: Secondary | ICD-10-CM

## 2013-08-18 DIAGNOSIS — C50919 Malignant neoplasm of unspecified site of unspecified female breast: Secondary | ICD-10-CM

## 2013-08-18 DIAGNOSIS — C773 Secondary and unspecified malignant neoplasm of axilla and upper limb lymph nodes: Secondary | ICD-10-CM

## 2013-08-18 DIAGNOSIS — Z170 Estrogen receptor positive status [ER+]: Secondary | ICD-10-CM

## 2013-08-18 DIAGNOSIS — Z5112 Encounter for antineoplastic immunotherapy: Secondary | ICD-10-CM

## 2013-08-18 LAB — CBC WITH DIFFERENTIAL/PLATELET
BASO%: 0.8 % (ref 0.0–2.0)
Basophils Absolute: 0 10*3/uL (ref 0.0–0.1)
EOS%: 2.6 % (ref 0.0–7.0)
Eosinophils Absolute: 0.1 10*3/uL (ref 0.0–0.5)
HEMATOCRIT: 42.6 % (ref 34.8–46.6)
HGB: 14.1 g/dL (ref 11.6–15.9)
LYMPH#: 0.8 10*3/uL — AB (ref 0.9–3.3)
LYMPH%: 18.5 % (ref 14.0–49.7)
MCH: 30.3 pg (ref 25.1–34.0)
MCHC: 33 g/dL (ref 31.5–36.0)
MCV: 91.6 fL (ref 79.5–101.0)
MONO#: 0.4 10*3/uL (ref 0.1–0.9)
MONO%: 9.7 % (ref 0.0–14.0)
NEUT#: 3 10*3/uL (ref 1.5–6.5)
NEUT%: 68.4 % (ref 38.4–76.8)
Platelets: 157 10*3/uL (ref 145–400)
RBC: 4.65 10*6/uL (ref 3.70–5.45)
RDW: 13.8 % (ref 11.2–14.5)
WBC: 4.4 10*3/uL (ref 3.9–10.3)

## 2013-08-18 LAB — COMPREHENSIVE METABOLIC PANEL (CC13)
ALT: 11 U/L (ref 0–55)
ANION GAP: 9 meq/L (ref 3–11)
AST: 28 U/L (ref 5–34)
Albumin: 4.1 g/dL (ref 3.5–5.0)
Alkaline Phosphatase: 65 U/L (ref 40–150)
BUN: 16.5 mg/dL (ref 7.0–26.0)
CALCIUM: 10.3 mg/dL (ref 8.4–10.4)
CHLORIDE: 108 meq/L (ref 98–109)
CO2: 25 meq/L (ref 22–29)
Creatinine: 0.9 mg/dL (ref 0.6–1.1)
Glucose: 98 mg/dl (ref 70–140)
Potassium: 4.1 mEq/L (ref 3.5–5.1)
Sodium: 142 mEq/L (ref 136–145)
Total Bilirubin: 0.46 mg/dL (ref 0.20–1.20)
Total Protein: 7.2 g/dL (ref 6.4–8.3)

## 2013-08-18 MED ORDER — ACETAMINOPHEN 325 MG PO TABS
650.0000 mg | ORAL_TABLET | Freq: Once | ORAL | Status: AC
  Administered 2013-08-18: 650 mg via ORAL

## 2013-08-18 MED ORDER — SODIUM CHLORIDE 0.9 % IV SOLN
6.0000 mg/kg | Freq: Once | INTRAVENOUS | Status: AC
  Administered 2013-08-18: 294 mg via INTRAVENOUS
  Filled 2013-08-18: qty 14

## 2013-08-18 MED ORDER — ACETAMINOPHEN 325 MG PO TABS
ORAL_TABLET | ORAL | Status: AC
  Filled 2013-08-18: qty 2

## 2013-08-18 MED ORDER — SODIUM CHLORIDE 0.9 % IV SOLN
Freq: Once | INTRAVENOUS | Status: AC
  Administered 2013-08-18: 16:00:00 via INTRAVENOUS

## 2013-08-18 NOTE — Telephone Encounter (Signed)
, °

## 2013-08-18 NOTE — Patient Instructions (Signed)
[facility] Discharge Instructions for Patients Receiving Chemotherapy  Today you received the following chemotherapy agents Herceptin.  To help prevent nausea and vomiting after your treatment, we encourage you to take your nausea medication as prescribed.   If you develop nausea and vomiting that is not controlled by your nausea medication, call the clinic.   BELOW ARE SYMPTOMS THAT SHOULD BE REPORTED IMMEDIATELY:  *FEVER GREATER THAN 100.5 F  *CHILLS WITH OR WITHOUT FEVER  NAUSEA AND VOMITING THAT IS NOT CONTROLLED WITH YOUR NAUSEA MEDICATION  *UNUSUAL SHORTNESS OF BREATH  *UNUSUAL BRUISING OR BLEEDING  TENDERNESS IN MOUTH AND THROAT WITH OR WITHOUT PRESENCE OF ULCERS  *URINARY PROBLEMS  *BOWEL PROBLEMS  UNUSUAL RASH Items with * indicate a potential emergency and should be followed up as soon as possible.  Feel free to call the clinic you have any questions or concerns. The clinic phone number is [phone].    

## 2013-08-18 NOTE — Progress Notes (Signed)
[facility]  [phone] [phone]  OFFICE PROGRESS NOTE   ID: ROSE-MARIE HICKLING   DOB: 1913-07-23  MR#: [id number]  JYN#:[id number]  Cc:  Azalee Course, Glori Bickers  HISTORY OF PRESENT ILLNESS: Mrs. Sheeler's gynecologist, Dr. Josefa Half, palpated a mass in the patient's left breast in 2004. The patient has a daughter, who is a Marine scientist, formerly I believe an oncology nurse, currently a pulmonary nurse in [place], so the patient went there for her care, and while I do not have all the workup, I do have the pathology report from October 01, 2002, which was her left lumpectomy and sentinel lymph node sampling under Dr. Kemper Durie. This showed [phone]) a 2.5 cm infiltrating ductal carcinoma with lobular features (the cells were E-cadherin positive), involving one of three sentinel lymph nodes. The anterior margin was close at less than 1 mm. The patient saw Dr. Arloa Koh here, and the option of completion mastectomy versus radiotherapy alone was discussed. He felt, and I would probably have agreed, that despite the very close margin, which was nevertheless technically negative, proceeding to radiation without "clearing the margin" further would be adequate. Accordingly, the patient did receive a total of 5040 cGy with an additional 1000 cGy boost to the left breast between September 11 and February 24, 2003.   The patient then did well, but in 2007 developed some redness and bumpiness around the left areola. She brought this to Dr. Ermelinda Das attention, and apparently an initial biopsy was negative, as was a mammogram, however, with further development of the change, the patient had a biopsy under anesthesia, and this apparently was positive (I do not have those records). With this information, she proceeded to left mastectomy on February 26, 2006. The pathology there ([id number]) showed dermal involvement by adenocarcinoma, with pagetoid spread and lymphatic  space invasion. The closest margin being 1.2 cm from the inferior margin. Everything else being amply negative. There were no discrete masses or lesions identified in the left breast. The patient subsequently underwent left mastectomy.   There were a series of further skin recurrences, first in November of 2008. Initially, these were resected, but later there were too many lesions to resect, in addition to a large left axillary lymph node. This was biopsied in January 2011, showing the tumor now to be HER-2/neu positive. Patient has been on anti-HER-2/neu treatments since January 2011, initially with trastuzumab plus lapatinib. Lapatinib was discontinued in April 2011 secondary to side effects. Continues now to receive trastuzumab on a q. 3 week basis. Also receives a zoledronic acid every 3 months.    INTERVAL HISTORY: Meline returns today with her son Gershon Mussel for followup of her metastatic breast cancer. The interval history is significant for her having fallen twice and injured her ribs. Luckily there was no fracture. She was under a great deal of pain but that is slowly clearing.  REVIEW OF SYSTEMS: She denies dizziness, gait imbalance, fainting mass or leg weakness. She says she just tripped on a dog bad 1 time and the other times she slipped in the bathroom. She cannot hear very well. She has a little bit of a running nose. She has her stress urinary incontinence, and she has her joint and back pain, none of which aren't new symptoms and none of which are more intense or persistent than before. She's not aware of any skin issues at this point. A detailed review of systems was otherwise noncontributory.   PAST MEDICAL HISTORY: Past Medical History  Diagnosis Date  . Cancer     breast  . Cataract   . CAD (coronary artery disease)     cath w/ trivial disease  . Diverticulosis of colon   . Osteoporosis     s/p compression fx T12  . Macular degeneration   . Breast cancer     PAST SURGICAL  HISTORY: Past Surgical History  Procedure Laterality Date  . Abdominal hysterectomy    . Breast lumpectomy    . Excision for paget's vulvar    . Vertbroplasty      T12  . Mastectomy      left  . Flexible sigmoidoscopy N/A 06/27/2012    Procedure: FLEXIBLE SIGMOIDOSCOPY;  Surgeon: Lafayette Dragon, MD;  Location: [facility];  Service: Endoscopy;  Laterality: N/A;    FAMILY HISTORY The patient's father died in an automobile accident at 12, the patient's mother from a stroke at 43. The patient had one brother who died from causes unknown to her at the age of 64, and a sister who had "lots of problems" and died at the age of 10, but there is no history of breast or ovarian cancer in the family.   GYNECOLOGIC HISTORY: She is GX P3. She took hormone replacement therapy briefly after menopause.   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 She has been a widow since 1997; lives independently, drives, and does all of her activities of daily living. Her daughter, [name], is a Marine scientist in [place]. Her daughter, [name], lives in [place].[name], who frequently comes with the patient to medical visits, is married to the patient's son, who is also her power of attorney. He works for the [employer]. The patient has six grandchildren and three great-grandchildren. She attends [employer].    ADVANCED DIRECTIVES: In place  HEALTH MAINTENANCE: History  Substance Use Topics  . Smoking status: Former Smoker    Quit date: 05/01/1990  . Smokeless tobacco: Never Used  . Alcohol Use: No     Colonoscopy: 02/06/2002  PAP: Not on file  Bone density: Not on file  Lipid panel: Not on file  Allergies  Allergen Reactions  . Solifenacin Succinate     REACTION: "sick" nausea    Current Outpatient Prescriptions  Medication Sig Dispense Refill  . acetaminophen (TYLENOL) 500 MG tablet Take 1,000 mg by mouth  daily.       . beta carotene w/minerals (OCUVITE) tablet Take 1 tablet by mouth daily.      Marland Kitchen BIOTIN PO Take 10,000 mg by mouth daily.      . cholecalciferol (VITAMIN D) 1000 UNITS tablet Take 1,000 Units by mouth daily.      . clobetasol cream (TEMOVATE) 3.08 % Apply 1 application topically 2 (two) times daily as needed.      Marland Kitchen losartan (COZAAR) 25 MG tablet Take 1 tablet (25 mg total) by mouth daily.  90 tablet  3  . Misc Natural Products (TURMERIC CURCUMIN) CAPS Take 2 capsules by mouth daily.      . polyvinyl alcohol (LIQUIFILM TEARS) 1.4 % ophthalmic solution Place 2 drops into both eyes as needed for dry eyes.      . traMADol (ULTRAM) 50 MG tablet Take 1 tablet (50 mg total) by mouth every 6 (six) hours as needed for moderate pain.  40 tablet  1  . trastuzumab (HERCEPTIN) 440 MG injection Inject into the vein once.        Marland Kitchen  zolendronic acid (ZOMETA) 4 MG/5ML injection Inject 4 mg into the vein every 3 (three) months.         No current facility-administered medications for this visit.    OBJECTIVE: Elderly white woman in mild distress Filed Vitals:   08/18/13 1459  BP: 168/83  Pulse: 73  Temp: 97.6 F (36.4 C)  Resp: 18     Body mass index is 19.25 kg/(m^2).    ECOG FS: 1  Sclerae unicteric, pupils equal and round  Oropharynx clear and moist No cervical or supraclavicular adenopathy Lungs no rales or rhonchi Heart regular rate and rhythm Abd soft, nontender, positive bowel sounds MSK kyphosis but no focal spinal tenderness, no upper extremity lymphedema, no rib cage tenderness to AP or lateral mild compression Neuro: nonfocal, well oriented, appropriate affect  Breasts: the right breast is unremarkable. The left breast is status post mastectomy. There is no evidence of chest wall recurrence. There is no evidence of skin change in the left flank either. There are some telangiectasias secondary to the prior chest wall irradiation. The left axilla is benign    LAB  RESULTS: Lab Results  Component Value Date   WBC 4.4 08/18/2013   NEUTROABS 3.0 08/18/2013   HGB 14.1 08/18/2013   HCT 42.6 08/18/2013   MCV 91.6 08/18/2013   PLT 157 08/18/2013      Chemistry      Component Value Date/Time   NA 142 08/18/2013 1444   NA 139 06/27/2012 0414   K 4.1 08/18/2013 1444   K 3.3* 06/27/2012 0414   CL 106 10/15/2012 1136   CL 109 06/27/2012 0414   CO2 25 08/18/2013 1444   CO2 21 06/27/2012 0414   BUN 16.5 08/18/2013 1444   BUN 7 06/27/2012 0414   CREATININE 0.9 08/18/2013 1444   CREATININE 0.67 06/27/2012 0414      Component Value Date/Time   CALCIUM 10.3 08/18/2013 1444   CALCIUM 8.9 06/27/2012 0414   ALKPHOS 65 08/18/2013 1444   ALKPHOS 43 06/27/2012 0414   AST 28 08/18/2013 1444   AST 34 06/27/2012 0414   ALT 11 08/18/2013 1444   ALT 18 06/27/2012 0414   BILITOT 0.46 08/18/2013 1444   BILITOT 0.2* 06/27/2012 0414       Lab Results  Component Value Date   LABCA2 17 09/26/2011     STUDIES: Study Result    CLINICAL DATA: Breast cancer. Increase left-sided chest and rib  pain  EXAM:  NUCLEAR MEDICINE WHOLE BODY BONE SCAN  TECHNIQUE:  Whole body anterior and posterior images were obtained approximately  3 hours after intravenous injection of radiopharmaceutical.  COMPARISON: Bone scan 02/17/2011, plain films 11/20/2012  RADIOPHARMACEUTICALS: 26.0 mCi Technetium99 MDP  FINDINGS:  Multiple new foci of abnormal radiotracer uptake at the junction of  the right posterior ribs in the vertebral bodies involving the 5th,  6, 7, 8) ribs. Uptake in the posterior 10 rib is also new. This is  in a pattern suggestive trauma. More linear uptake within the  anterior medial 5th rib on the left is more concerning for  metastasis. Degenerate uptake noted in the lower lumbar spine. No  abnormal uptake within the appendicular skeleton to suggest  metastasis.  IMPRESSION:  1. Traumatic pattern uptake radiotracer uptake within the posterior  right ribs suggest fractures   2. More linear uptake involving the anterior left 5th rib is  concerning for metastasis although cannot exclude trauma.  5. A sodium fluoride PET-CT bone scan may be  beneficial for further  characterization.  Electronically Signed  By: Suzy Bouchard M.D.  On: 02/19/2013 14:40        ASSESSMENT: Ms. Kalina is a 80 y.o.  [place], [place] woman:  (1) Status post left lumpectomy and sentinel lymph node dissection in 09/2002 for a T2 N1 (Stage II) invasive ductal carcinoma, triple negative, status post radiation therapy completed in 01/2003.  (2) Local recurrence around the areola 01/2006, leading to mastectomy.  (3) A series of further skin recurrences first noted in 03/2007. Initially these were resected, later with too many lesions to resect; there was also a large left axillary lymph node which was biopsied in 04/2009 showing the tumor now to be HER-2 positive.   (4) Since 04/2009 she has been on anti-HER-2 treatment, initially with trastuzumab plus lapatinib, with a lapatinib discontinued 07/2009 because of side effects.   (5) Recurrence of vulvar Paget's disease, originally diagnosed and resected in 2004 and recurred in 02/2012.  Followed by Dr. Paula Compton and Dr. Marti Sleigh.  (6) Continues to receive trastuzumab every 3 weeks and zoledronic acid every 3 months. Most recent echo 12/08/2012 showed an ejection fraction in the 50-55% range.  (7) Nocturnal leg cramping  PLAN: Talayah is doing just fine as far as breast cancer is concerned, with no evidence of disease activity. We are continuing the trastuzumab today and then monthly, picking up on may 20th, since she prefers Wednesday's. She needs an echo, which you missed, and it is being rescheduled.  I urged her to use a cane so she does not fall anymore. She adamantly refuses.  She is going to see [place] again in July. Of course we will be glad to see her before then as the need arises. She knows to call  for any other problems.  Chauncey Cruel, MD  08/18/2013 3:39 PM

## 2013-08-19 ENCOUNTER — Telehealth: Payer: Self-pay | Admitting: *Deleted

## 2013-08-19 NOTE — Telephone Encounter (Signed)
Per staff message and POF I have scheduled appts.  JMW  

## 2013-08-21 ENCOUNTER — Ambulatory Visit (HOSPITAL_COMMUNITY)
Admission: RE | Admit: 2013-08-21 | Discharge: 2013-08-21 | Disposition: A | Discharge status: Home / Self Care | Payer: Medicare Other | Source: Ambulatory Visit | Attending: Internal Medicine | Admitting: Internal Medicine

## 2013-08-21 DIAGNOSIS — Z79899 Other long term (current) drug therapy: Secondary | ICD-10-CM | POA: Insufficient documentation

## 2013-08-21 DIAGNOSIS — I059 Rheumatic mitral valve disease, unspecified: Secondary | ICD-10-CM

## 2013-08-21 DIAGNOSIS — I080 Rheumatic disorders of both mitral and aortic valves: Secondary | ICD-10-CM | POA: Insufficient documentation

## 2013-08-21 DIAGNOSIS — C50919 Malignant neoplasm of unspecified site of unspecified female breast: Secondary | ICD-10-CM | POA: Insufficient documentation

## 2013-08-21 DIAGNOSIS — I079 Rheumatic tricuspid valve disease, unspecified: Secondary | ICD-10-CM | POA: Insufficient documentation

## 2013-08-21 DIAGNOSIS — C50912 Malignant neoplasm of unspecified site of left female breast: Secondary | ICD-10-CM

## 2013-08-21 NOTE — Progress Notes (Signed)
Echocardiogram 2D Echocardiogram has been performed.  [name] 08/21/2013, 11:26 AM

## 2013-09-15 ENCOUNTER — Other Ambulatory Visit: Payer: Self-pay | Admitting: Physician Assistant

## 2013-09-16 ENCOUNTER — Other Ambulatory Visit (HOSPITAL_BASED_OUTPATIENT_CLINIC_OR_DEPARTMENT_OTHER): Payer: Medicare Other

## 2013-09-16 ENCOUNTER — Other Ambulatory Visit: Payer: Self-pay | Admitting: Oncology

## 2013-09-16 ENCOUNTER — Ambulatory Visit (HOSPITAL_BASED_OUTPATIENT_CLINIC_OR_DEPARTMENT_OTHER): Payer: Medicare Other

## 2013-09-16 VITALS — BP 167/81 | HR 97 | Temp 97.8°F | Resp 20

## 2013-09-16 DIAGNOSIS — C50912 Malignant neoplasm of unspecified site of left female breast: Secondary | ICD-10-CM

## 2013-09-16 DIAGNOSIS — C50919 Malignant neoplasm of unspecified site of unspecified female breast: Secondary | ICD-10-CM

## 2013-09-16 DIAGNOSIS — M810 Age-related osteoporosis without current pathological fracture: Secondary | ICD-10-CM

## 2013-09-16 DIAGNOSIS — C779 Secondary and unspecified malignant neoplasm of lymph node, unspecified: Secondary | ICD-10-CM

## 2013-09-16 DIAGNOSIS — Z5112 Encounter for antineoplastic immunotherapy: Secondary | ICD-10-CM

## 2013-09-16 DIAGNOSIS — Z170 Estrogen receptor positive status [ER+]: Secondary | ICD-10-CM

## 2013-09-16 DIAGNOSIS — C773 Secondary and unspecified malignant neoplasm of axilla and upper limb lymph nodes: Secondary | ICD-10-CM

## 2013-09-16 LAB — CBC WITH DIFFERENTIAL/PLATELET
BASO%: 0.8 % (ref 0.0–2.0)
Basophils Absolute: 0 10*3/uL (ref 0.0–0.1)
EOS ABS: 0.2 10*3/uL (ref 0.0–0.5)
EOS%: 4.3 % (ref 0.0–7.0)
HCT: 41.4 % (ref 34.8–46.6)
HEMOGLOBIN: 13.3 g/dL (ref 11.6–15.9)
LYMPH#: 1 10*3/uL (ref 0.9–3.3)
LYMPH%: 20.4 % (ref 14.0–49.7)
MCH: 29.6 pg (ref 25.1–34.0)
MCHC: 32.1 g/dL (ref 31.5–36.0)
MCV: 92.1 fL (ref 79.5–101.0)
MONO#: 0.5 10*3/uL (ref 0.1–0.9)
MONO%: 10.7 % (ref 0.0–14.0)
NEUT%: 63.8 % (ref 38.4–76.8)
NEUTROS ABS: 3.1 10*3/uL (ref 1.5–6.5)
Platelets: 154 10*3/uL (ref 145–400)
RBC: 4.49 10*6/uL (ref 3.70–5.45)
RDW: 13.8 % (ref 11.2–14.5)
WBC: 4.8 10*3/uL (ref 3.9–10.3)

## 2013-09-16 LAB — COMPREHENSIVE METABOLIC PANEL (CC13)
ALBUMIN: 4 g/dL (ref 3.5–5.0)
ALT: 14 U/L (ref 0–55)
AST: 27 U/L (ref 5–34)
Alkaline Phosphatase: 61 U/L (ref 40–150)
Anion Gap: 11 mEq/L (ref 3–11)
BUN: 15.4 mg/dL (ref 7.0–26.0)
CALCIUM: 10.1 mg/dL (ref 8.4–10.4)
CHLORIDE: 109 meq/L (ref 98–109)
CO2: 23 mEq/L (ref 22–29)
Creatinine: 0.9 mg/dL (ref 0.6–1.1)
GLUCOSE: 91 mg/dL (ref 70–140)
POTASSIUM: 4.5 meq/L (ref 3.5–5.1)
Sodium: 143 mEq/L (ref 136–145)
Total Bilirubin: 0.48 mg/dL (ref 0.20–1.20)
Total Protein: 6.9 g/dL (ref 6.4–8.3)

## 2013-09-16 MED ORDER — SODIUM CHLORIDE 0.9 % IV SOLN
Freq: Once | INTRAVENOUS | Status: AC
  Administered 2013-09-16: 12:00:00 via INTRAVENOUS

## 2013-09-16 MED ORDER — ZOLEDRONIC ACID 4 MG/5ML IV CONC
3.0000 mg | Freq: Once | INTRAVENOUS | Status: AC
  Administered 2013-09-16: 3 mg via INTRAVENOUS
  Filled 2013-09-16: qty 3.75

## 2013-09-16 MED ORDER — ACETAMINOPHEN 325 MG PO TABS
ORAL_TABLET | ORAL | Status: AC
  Filled 2013-09-16: qty 2

## 2013-09-16 MED ORDER — SODIUM CHLORIDE 0.9 % IV SOLN
6.0000 mg/kg | Freq: Once | INTRAVENOUS | Status: AC
  Administered 2013-09-16: 294 mg via INTRAVENOUS
  Filled 2013-09-16: qty 14

## 2013-09-16 MED ORDER — ACETAMINOPHEN 325 MG PO TABS
650.0000 mg | ORAL_TABLET | Freq: Once | ORAL | Status: AC
  Administered 2013-09-16: 650 mg via ORAL

## 2013-09-16 NOTE — Patient Instructions (Addendum)
Trastuzumab injection for infusion What is this medicine? TRASTUZUMAB (tras TOO zoo mab) is a monoclonal antibody. It targets a protein called HER2. This protein is found in some stomach and breast cancers. This medicine can stop cancer cell growth. This medicine may be used with other cancer treatments. This medicine may be used for other purposes; ask your health care provider or pharmacist if you have questions. COMMON BRAND NAME(S): Herceptin What should I tell my health care provider before I take this medicine? They need to know if you have any of these conditions: -heart disease -heart failure -infection (especially a virus infection such as chickenpox, cold sores, or herpes) -lung or breathing disease, like asthma -recent or ongoing radiation therapy -an unusual or allergic reaction to trastuzumab, benzyl alcohol, or other medications, foods, dyes, or preservatives -pregnant or trying to get pregnant -breast-feeding How should I use this medicine? This drug is given as an infusion into a vein. It is administered in a hospital or clinic by a specially trained health care professional. Talk to your pediatrician regarding the use of this medicine in children. This medicine is not approved for use in children. Overdosage: If you think you have taken too much of this medicine contact a poison control center or emergency room at once. NOTE: This medicine is only for you. Do not share this medicine with others. What if I miss a dose? It is important not to miss a dose. Call your doctor or health care professional if you are unable to keep an appointment. What may interact with this medicine? -cyclophosphamide -doxorubicin -warfarin This list may not describe all possible interactions. Give your health care provider a list of all the medicines, herbs, non-prescription drugs, or dietary supplements you use. Also tell them if you smoke, drink alcohol, or use illegal drugs. Some items may  interact with your medicine. What should I watch for while using this medicine? Visit your doctor for checks on your progress. Report any side effects. Continue your course of treatment even though you feel ill unless your doctor tells you to stop. Call your doctor or health care professional for advice if you get a fever, chills or sore throat, or other symptoms of a cold or flu. Do not treat yourself. Try to avoid being around people who are sick. You may experience fever, chills and shaking during your first infusion. These effects are usually mild and can be treated with other medicines. Report any side effects during the infusion to your health care professional. Fever and chills usually do not happen with later infusions. What side effects may I notice from receiving this medicine? Side effects that you should report to your doctor or other health care professional as soon as possible: -breathing difficulties -chest pain or palpitations -cough -dizziness or fainting -fever or chills, sore throat -skin rash, itching or hives -swelling of the legs or ankles -unusually weak or tired Side effects that usually do not require medical attention (report to your doctor or other health care professional if they continue or are bothersome): -loss of appetite -headache -muscle aches -nausea This list may not describe all possible side effects. Call your doctor for medical advice about side effects. You may report side effects to FDA at 1-800-FDA-1088. Where should I keep my medicine? This drug is given in a hospital or clinic and will not be stored at home. NOTE: This sheet is a summary. It may not cover all possible information. If you have questions about this medicine, talk   to your doctor, pharmacist, or health care provider.  2014, [employer]vier/Gold Standard. (2009-02-18 13:43:15) Zoledronic Acid injection (Hypercalcemia, Oncology) What is this medicine? ZOLEDRONIC ACID (ZOE le dron ik AS id)  lowers the amount of calcium loss from bone. It is used to treat too much calcium in your blood from cancer. It is also used to prevent complications of cancer that has spread to the bone. This medicine may be used for other purposes; ask your health care provider or pharmacist if you have questions. COMMON BRAND NAME(S): Zometa What should I tell my health care provider before I take this medicine? They need to know if you have any of these conditions: -aspirin-sensitive asthma -cancer, especially if you are receiving medicines used to treat cancer -dental disease or wear dentures -infection -kidney disease -receiving corticosteroids like dexamethasone or prednisone -an unusual or allergic reaction to zoledronic acid, other medicines, foods, dyes, or preservatives -pregnant or trying to get pregnant -breast-feeding How should I use this medicine? This medicine is for infusion into a vein. It is given by a health care professional in a hospital or clinic setting. Talk to your pediatrician regarding the use of this medicine in children. Special care may be needed. Overdosage: If you think you have taken too much of this medicine contact a poison control center or emergency room at once. NOTE: This medicine is only for you. Do not share this medicine with others. What if I miss a dose? It is important not to miss your dose. Call your doctor or health care professional if you are unable to keep an appointment. What may interact with this medicine? -certain antibiotics given by injection -NSAIDs, medicines for pain and inflammation, like ibuprofen or naproxen -some diuretics like bumetanide, furosemide -teriparatide -thalidomide This list may not describe all possible interactions. Give your health care provider a list of all the medicines, herbs, non-prescription drugs, or dietary supplements you use. Also tell them if you smoke, drink alcohol, or use illegal drugs. Some items may interact  with your medicine. What should I watch for while using this medicine? Visit your doctor or health care professional for regular checkups. It may be some time before you see the benefit from this medicine. Do not stop taking your medicine unless your doctor tells you to. Your doctor may order blood tests or other tests to see how you are doing. Women should inform their doctor if they wish to become pregnant or think they might be pregnant. There is a potential for serious side effects to an unborn child. Talk to your health care professional or pharmacist for more information. You should make sure that you get enough calcium and vitamin D while you are taking this medicine. Discuss the foods you eat and the vitamins you take with your health care professional. Some people who take this medicine have severe bone, joint, and/or muscle pain. This medicine may also increase your risk for jaw problems or a broken thigh bone. Tell your doctor right away if you have severe pain in your jaw, bones, joints, or muscles. Tell your doctor if you have any pain that does not go away or that gets worse. Tell your dentist and dental surgeon that you are taking this medicine. You should not have major dental surgery while on this medicine. See your dentist to have a dental exam and fix any dental problems before starting this medicine. Take good care of your teeth while on this medicine. Make sure you see your dentist for regular  follow-up appointments. What side effects may I notice from receiving this medicine? Side effects that you should report to your doctor or health care professional as soon as possible: -allergic reactions like skin rash, itching or hives, swelling of the face, lips, or tongue -anxiety, confusion, or depression -breathing problems -changes in vision -eye pain -feeling faint or lightheaded, falls -jaw pain, especially after dental work -mouth sores -muscle cramps, stiffness, or  weakness -trouble passing urine or change in the amount of urine Side effects that usually do not require medical attention (report to your doctor or health care professional if they continue or are bothersome): -bone, joint, or muscle pain -constipation -diarrhea -fever -hair loss -irritation at site where injected -loss of appetite -nausea, vomiting -stomach upset -trouble sleeping -trouble swallowing -weak or tired This list may not describe all possible side effects. Call your doctor for medical advice about side effects. You may report side effects to FDA at 1-800-FDA-1088. Where should I keep my medicine? This drug is given in a hospital or clinic and will not be stored at home. NOTE: This sheet is a summary. It may not cover all possible information. If you have questions about this medicine, talk to your doctor, pharmacist, or health care provider.  2014, [employer]vier/Gold Standard. (2012-09-25 13:03:13)

## 2013-10-09 ENCOUNTER — Encounter: Payer: Self-pay | Admitting: Internal Medicine

## 2013-10-09 ENCOUNTER — Ambulatory Visit (INDEPENDENT_AMBULATORY_CARE_PROVIDER_SITE_OTHER): Payer: Medicare Other | Admitting: Internal Medicine

## 2013-10-09 VITALS — BP 122/68 | HR 69 | Temp 97.9°F | Wt 106.4 lb

## 2013-10-09 DIAGNOSIS — H3530 Unspecified macular degeneration: Secondary | ICD-10-CM | POA: Insufficient documentation

## 2013-10-09 DIAGNOSIS — R42 Dizziness and giddiness: Secondary | ICD-10-CM

## 2013-10-09 DIAGNOSIS — I2510 Atherosclerotic heart disease of native coronary artery without angina pectoris: Secondary | ICD-10-CM

## 2013-10-09 DIAGNOSIS — R32 Unspecified urinary incontinence: Secondary | ICD-10-CM

## 2013-10-09 DIAGNOSIS — I10 Essential (primary) hypertension: Secondary | ICD-10-CM

## 2013-10-09 NOTE — Assessment & Plan Note (Signed)
Chronic issue Suspect related to prolapse as described on history, but exam not performed today Will refer to urology, for discussion - suspect limited (if any) intervention available given age and poor suitability for surgical repair Prior antispasmodic trials for OAB such as Detrol and Vesicare ineffective per family/pt report No medication changes recommended

## 2013-10-09 NOTE — Progress Notes (Signed)
Pre visit review using our clinic review tool, if applicable. No additional management support is needed unless otherwise documented below in the visit note. 

## 2013-10-09 NOTE — Progress Notes (Signed)
Subjective:    Patient ID: [name], female    DOB: Sep 04, 1913, 80 y.o.   MRN: [id number]  Dizziness This is a recurrent problem. The current episode started more than 1 month ago. The problem occurs daily. The problem has been unchanged. Associated symptoms include urinary symptoms (chronic) and a visual change (worsening L>R, known macular degeneration). Pertinent negatives include no abdominal pain, arthralgias, change in bowel habit, chest pain, coughing, fatigue, fever, headaches, joint swelling, myalgias, nausea, numbness, rash, sore throat, vomiting or weakness. Exacerbated by: eye movement (looking up from reading) induced sx of dizziness (resolves with closing eyes or lying down) She has tried lying down for the symptoms. The treatment provided significant relief.    Denies recent falls due to same  Reviewed chronic medical issues and interval medical events  Past Medical History  Diagnosis Date  . Cancer     breast  . Cataract   . CAD (coronary artery disease)     cath w/ trivial disease  . Diverticulosis of colon   . Osteoporosis     s/p compression fx T12  . Macular degeneration   . Breast cancer     Review of Systems  Constitutional: Negative for fever and fatigue.  HENT: Negative for sore throat.   Eyes: Positive for visual disturbance.  Respiratory: Negative for cough and shortness of breath.   Cardiovascular: Negative for chest pain.  Gastrointestinal: Negative for nausea, vomiting, abdominal pain and change in bowel habit.  Genitourinary: Positive for urgency, frequency and difficulty urinating.  Musculoskeletal: Negative for arthralgias, joint swelling and myalgias.  Skin: Negative for rash.  Neurological: Positive for dizziness. Negative for tremors, seizures, syncope, facial asymmetry, speech difficulty, weakness, light-headedness, numbness and headaches.       Objective:   Physical Exam  BP 122/68  Pulse 69  Temp(Src) 97.9 F (36.6 C)  (Oral)  Wt 106 lb 6.4 oz (48.263 kg)  SpO2 93% Wt Readings from Last 3 Encounters:  10/09/13 106 lb 6.4 oz (48.263 kg)  08/18/13 103 lb 6.4 oz (46.902 kg)  08/06/13 105 lb (47.628 kg)   Constitutional: She appears well-developed and well-nourished. No distress. HOH - dtr (visiting) at side Neck: Normal range of motion. Neck supple. No carotid bruit or JVD present. No thyromegaly present.  Cardiovascular: Normal rate, regular rhythm and normal heart sounds.  No murmur heard. No BLE edema. Pulmonary/Chest: Effort normal and breath sounds normal. No respiratory distress. She has no wheezes.  GU: defer to gyn or uro Neuro: awake, alert, oriented x4. Cranial nerves II through XII symmetrically intact. Speech, cognition, recall intact. Gait unaided, cautious but intact and independent Psychiatric: She has a normal mood and affect. Her behavior is normal. Judgment and thought content normal.   Lab Results  Component Value Date   WBC 4.8 09/16/2013   HGB 13.3 09/16/2013   HCT 41.4 09/16/2013   PLT 154 09/16/2013   GLUCOSE 91 09/16/2013   ALT 14 09/16/2013   AST 27 09/16/2013   NA 143 09/16/2013   K 4.5 09/16/2013   CL 106 10/15/2012   CREATININE 0.9 09/16/2013   BUN 15.4 09/16/2013   CO2 23 09/16/2013   TSH 1.082 01/25/2010    No results found.     Assessment & Plan:   Dizziness - symptoms induced by eye movement, but not consistent. Not reproducible on exam today. No nystagmus or other neuro findings. Advised followup with ophthalmologist for this and known macular degeneration with progressive left greater than  right visual changes. Advised patient and family on fall prevention and home safety, family to consider medic alert system  Problem List Items Addressed This Visit   Hypertension      BP Readings from Last 3 Encounters:  10/09/13 122/68  09/16/13 167/81  08/18/13 168/83   The current medical regimen is effective;  continue present plan and medications.     Macular degeneration       Patient will independently followup with her ophthalmology specialist on this and potential association with dizziness precipitated by eye movement    Urinary incontinence     Chronic issue Suspect related to prolapse as described on history, but exam not performed today Will refer to urology, for discussion - suspect limited (if any) intervention available given age and poor suitability for surgical repair Prior antispasmodic trials for OAB such as Detrol and Vesicare ineffective per family/pt report No medication changes recommended    Relevant Orders      Ambulatory referral to Urology    Other Visit Diagnoses   Dizzy spells    -  Primary

## 2013-10-09 NOTE — Assessment & Plan Note (Signed)
BP Readings from Last 3 Encounters:  10/09/13 122/68  09/16/13 167/81  08/18/13 168/83   The current medical regimen is effective;  continue present plan and medications.

## 2013-10-09 NOTE — Patient Instructions (Signed)
It was good to see you today.  We have reviewed your prior records including labs and tests today  Medications reviewed and updated, no changes recommended at this time.  we'll make referral to bladder specialist as discussed . Our office will contact you regarding appointment(s) once made.  Please schedule followup in 3-4 months, call sooner if problems.

## 2013-10-09 NOTE — Assessment & Plan Note (Signed)
Patient will independently followup with her ophthalmology specialist on this and potential association with dizziness precipitated by eye movement

## 2013-10-14 ENCOUNTER — Other Ambulatory Visit (HOSPITAL_BASED_OUTPATIENT_CLINIC_OR_DEPARTMENT_OTHER): Payer: Medicare Other

## 2013-10-14 ENCOUNTER — Ambulatory Visit (HOSPITAL_BASED_OUTPATIENT_CLINIC_OR_DEPARTMENT_OTHER): Payer: Medicare Other

## 2013-10-14 VITALS — BP 166/73 | HR 67 | Temp 96.9°F | Resp 17

## 2013-10-14 DIAGNOSIS — C773 Secondary and unspecified malignant neoplasm of axilla and upper limb lymph nodes: Secondary | ICD-10-CM

## 2013-10-14 DIAGNOSIS — C779 Secondary and unspecified malignant neoplasm of lymph node, unspecified: Secondary | ICD-10-CM

## 2013-10-14 DIAGNOSIS — C50919 Malignant neoplasm of unspecified site of unspecified female breast: Secondary | ICD-10-CM

## 2013-10-14 DIAGNOSIS — Z5112 Encounter for antineoplastic immunotherapy: Secondary | ICD-10-CM

## 2013-10-14 DIAGNOSIS — C50912 Malignant neoplasm of unspecified site of left female breast: Secondary | ICD-10-CM

## 2013-10-14 LAB — COMPREHENSIVE METABOLIC PANEL (CC13)
ALBUMIN: 3.9 g/dL (ref 3.5–5.0)
ALK PHOS: 61 U/L (ref 40–150)
ALT: 14 U/L (ref 0–55)
AST: 26 U/L (ref 5–34)
Anion Gap: 9 mEq/L (ref 3–11)
BUN: 16 mg/dL (ref 7.0–26.0)
CO2: 25 mEq/L (ref 22–29)
Calcium: 9.9 mg/dL (ref 8.4–10.4)
Chloride: 110 mEq/L — ABNORMAL HIGH (ref 98–109)
Creatinine: 0.8 mg/dL (ref 0.6–1.1)
GLUCOSE: 97 mg/dL (ref 70–140)
POTASSIUM: 4.1 meq/L (ref 3.5–5.1)
Sodium: 144 mEq/L (ref 136–145)
TOTAL PROTEIN: 6.9 g/dL (ref 6.4–8.3)
Total Bilirubin: 0.57 mg/dL (ref 0.20–1.20)

## 2013-10-14 LAB — CBC WITH DIFFERENTIAL/PLATELET
BASO%: 0.7 % (ref 0.0–2.0)
Basophils Absolute: 0 10*3/uL (ref 0.0–0.1)
EOS ABS: 0.2 10*3/uL (ref 0.0–0.5)
EOS%: 4 % (ref 0.0–7.0)
HCT: 41.4 % (ref 34.8–46.6)
HGB: 13.4 g/dL (ref 11.6–15.9)
LYMPH%: 19.3 % (ref 14.0–49.7)
MCH: 29.9 pg (ref 25.1–34.0)
MCHC: 32.3 g/dL (ref 31.5–36.0)
MCV: 92.4 fL (ref 79.5–101.0)
MONO#: 0.5 10*3/uL (ref 0.1–0.9)
MONO%: 10.3 % (ref 0.0–14.0)
NEUT%: 65.7 % (ref 38.4–76.8)
NEUTROS ABS: 3.1 10*3/uL (ref 1.5–6.5)
Platelets: 151 10*3/uL (ref 145–400)
RBC: 4.49 10*6/uL (ref 3.70–5.45)
RDW: 14.3 % (ref 11.2–14.5)
WBC: 4.6 10*3/uL (ref 3.9–10.3)
lymph#: 0.9 10*3/uL (ref 0.9–3.3)

## 2013-10-14 MED ORDER — TRASTUZUMAB CHEMO INJECTION 440 MG
6.0000 mg/kg | Freq: Once | INTRAVENOUS | Status: AC
  Administered 2013-10-14: 294 mg via INTRAVENOUS
  Filled 2013-10-14: qty 14

## 2013-10-14 MED ORDER — ACETAMINOPHEN 325 MG PO TABS
ORAL_TABLET | ORAL | Status: AC
  Filled 2013-10-14: qty 2

## 2013-10-14 MED ORDER — SODIUM CHLORIDE 0.9 % IV SOLN
Freq: Once | INTRAVENOUS | Status: AC
  Administered 2013-10-14: 12:00:00 via INTRAVENOUS

## 2013-10-14 MED ORDER — ACETAMINOPHEN 325 MG PO TABS
650.0000 mg | ORAL_TABLET | Freq: Once | ORAL | Status: AC
  Administered 2013-10-14: 650 mg via ORAL

## 2013-10-14 NOTE — Patient Instructions (Signed)
[place] Discharge Instructions for Patients Receiving Biotherapy  Today you received the agent: Herceptin   To help prevent nausea and vomiting after your treatment, we encourage you to take your nausea medication as prescribed.    If you develop nausea and vomiting that is not controlled by your nausea medication, call the clinic.   BELOW ARE SYMPTOMS THAT SHOULD BE REPORTED IMMEDIATELY:  *FEVER GREATER THAN 100.5 F  *CHILLS WITH OR WITHOUT FEVER  NAUSEA AND VOMITING THAT IS NOT CONTROLLED WITH YOUR NAUSEA MEDICATION  *UNUSUAL SHORTNESS OF BREATH  *UNUSUAL BRUISING OR BLEEDING  TENDERNESS IN MOUTH AND THROAT WITH OR WITHOUT PRESENCE OF ULCERS  *URINARY PROBLEMS  *BOWEL PROBLEMS  UNUSUAL RASH Items with * indicate a potential emergency and should be followed up as soon as possible.  Feel free to call the clinic you have any questions or concerns. The clinic phone number is [phone].

## 2013-10-15 ENCOUNTER — Ambulatory Visit: Payer: Medicare Other | Admitting: Internal Medicine

## 2013-11-05 ENCOUNTER — Telehealth: Payer: Self-pay | Admitting: *Deleted

## 2013-11-05 NOTE — Telephone Encounter (Signed)
Notified pt of appointment and provider change. Original appointment 11/11/13 @ 11:45. New Appointments 11/11/13 Lab @ 11:00 and MD @ 11:30. Pt agreed with date and time

## 2013-11-10 ENCOUNTER — Other Ambulatory Visit: Payer: Self-pay | Admitting: *Deleted

## 2013-11-10 DIAGNOSIS — M889 Osteitis deformans of unspecified bone: Secondary | ICD-10-CM

## 2013-11-10 DIAGNOSIS — C50912 Malignant neoplasm of unspecified site of left female breast: Secondary | ICD-10-CM

## 2013-11-11 ENCOUNTER — Telehealth: Payer: Self-pay | Admitting: Oncology

## 2013-11-11 ENCOUNTER — Ambulatory Visit (HOSPITAL_BASED_OUTPATIENT_CLINIC_OR_DEPARTMENT_OTHER): Payer: Medicare Other | Admitting: Hematology

## 2013-11-11 ENCOUNTER — Other Ambulatory Visit: Payer: Self-pay

## 2013-11-11 ENCOUNTER — Other Ambulatory Visit (HOSPITAL_BASED_OUTPATIENT_CLINIC_OR_DEPARTMENT_OTHER): Payer: Medicare Other

## 2013-11-11 ENCOUNTER — Ambulatory Visit (HOSPITAL_BASED_OUTPATIENT_CLINIC_OR_DEPARTMENT_OTHER): Payer: Medicare Other

## 2013-11-11 ENCOUNTER — Encounter: Payer: Self-pay | Admitting: Hematology

## 2013-11-11 VITALS — BP 168/74 | HR 65 | Temp 97.4°F | Resp 20 | Ht 61.46 in | Wt 105.1 lb

## 2013-11-11 DIAGNOSIS — C50919 Malignant neoplasm of unspecified site of unspecified female breast: Secondary | ICD-10-CM

## 2013-11-11 DIAGNOSIS — C773 Secondary and unspecified malignant neoplasm of axilla and upper limb lymph nodes: Secondary | ICD-10-CM

## 2013-11-11 DIAGNOSIS — Z171 Estrogen receptor negative status [ER-]: Secondary | ICD-10-CM

## 2013-11-11 DIAGNOSIS — C50912 Malignant neoplasm of unspecified site of left female breast: Secondary | ICD-10-CM

## 2013-11-11 DIAGNOSIS — Z5112 Encounter for antineoplastic immunotherapy: Secondary | ICD-10-CM

## 2013-11-11 DIAGNOSIS — M889 Osteitis deformans of unspecified bone: Secondary | ICD-10-CM

## 2013-11-11 LAB — CBC WITH DIFFERENTIAL/PLATELET
BASO%: 0.8 % (ref 0.0–2.0)
Basophils Absolute: 0 10*3/uL (ref 0.0–0.1)
EOS%: 3.9 % (ref 0.0–7.0)
Eosinophils Absolute: 0.2 10*3/uL (ref 0.0–0.5)
HEMATOCRIT: 42.3 % (ref 34.8–46.6)
HGB: 13.5 g/dL (ref 11.6–15.9)
LYMPH%: 22.3 % (ref 14.0–49.7)
MCH: 29.4 pg (ref 25.1–34.0)
MCHC: 31.9 g/dL (ref 31.5–36.0)
MCV: 92 fL (ref 79.5–101.0)
MONO#: 0.4 10*3/uL (ref 0.1–0.9)
MONO%: 10.2 % (ref 0.0–14.0)
NEUT#: 2.6 10*3/uL (ref 1.5–6.5)
NEUT%: 62.8 % (ref 38.4–76.8)
PLATELETS: 153 10*3/uL (ref 145–400)
RBC: 4.59 10*6/uL (ref 3.70–5.45)
RDW: 14.3 % (ref 11.2–14.5)
WBC: 4.1 10*3/uL (ref 3.9–10.3)
lymph#: 0.9 10*3/uL (ref 0.9–3.3)

## 2013-11-11 LAB — COMPREHENSIVE METABOLIC PANEL (CC13)
ALT: 12 U/L (ref 0–55)
ANION GAP: 9 meq/L (ref 3–11)
AST: 29 U/L (ref 5–34)
Albumin: 4.1 g/dL (ref 3.5–5.0)
Alkaline Phosphatase: 57 U/L (ref 40–150)
BILIRUBIN TOTAL: 0.58 mg/dL (ref 0.20–1.20)
BUN: 14.6 mg/dL (ref 7.0–26.0)
CO2: 23 mEq/L (ref 22–29)
CREATININE: 0.9 mg/dL (ref 0.6–1.1)
Calcium: 10.2 mg/dL (ref 8.4–10.4)
Chloride: 110 mEq/L — ABNORMAL HIGH (ref 98–109)
Glucose: 92 mg/dl (ref 70–140)
Potassium: 4.1 mEq/L (ref 3.5–5.1)
Sodium: 142 mEq/L (ref 136–145)
Total Protein: 7.2 g/dL (ref 6.4–8.3)

## 2013-11-11 MED ORDER — SODIUM CHLORIDE 0.9 % IV SOLN
Freq: Once | INTRAVENOUS | Status: AC
  Administered 2013-11-11: 13:00:00 via INTRAVENOUS

## 2013-11-11 MED ORDER — TRASTUZUMAB CHEMO INJECTION 440 MG
6.0000 mg/kg | Freq: Once | INTRAVENOUS | Status: AC
  Administered 2013-11-11: 294 mg via INTRAVENOUS
  Filled 2013-11-11: qty 14

## 2013-11-11 MED ORDER — ACETAMINOPHEN 325 MG PO TABS
650.0000 mg | ORAL_TABLET | Freq: Once | ORAL | Status: AC
  Administered 2013-11-11: 650 mg via ORAL

## 2013-11-11 MED ORDER — ACETAMINOPHEN 325 MG PO TABS
ORAL_TABLET | ORAL | Status: AC
  Filled 2013-11-11: qty 2

## 2013-11-11 NOTE — Telephone Encounter (Signed)
gv relative appt schedule for aug thru nov

## 2013-11-11 NOTE — Progress Notes (Signed)
[place]  [phone] [phone]  OFFICE PROGRESS NOTE   ID: [name]   DOB: [birth year]  MR#: [id number]  NLG#:[id number]  Cc:  [name], [name]  Chief Complaint: Patient with Her 2+ BC for f/u.  HISTORY OF PRESENT ILLNESS:  [name]'s gynecologist, Dr. Josefa [name], palpated a mass in the patient's left breast in 2004. The patient has a daughter, who is a Marine scientist, formerly I believe an oncology nurse, currently a pulmonary nurse in [place], so the patient went there for her care, and while I do not have all the workup, I do have the pathology report from October 01, 2002, which was her left lumpectomy and sentinel lymph node sampling under Dr. Kemper [name]. This showed [phone]) a 2.5 cm infiltrating ductal carcinoma with lobular features (the cells were E-cadherin positive), involving one of three sentinel lymph nodes. The anterior margin was close at less than 1 mm. The patient saw Dr. Arloa [name] here, and the option of completion mastectomy versus radiotherapy alone was discussed. He felt, and I would probably have agreed, that despite the very close margin, which was nevertheless technically negative, proceeding to radiation without "clearing the margin" further would be adequate. Accordingly, the patient did receive a total of 5040 cGy with an additional 1000 cGy boost to the left breast between September 11 and February 24, 2003.   The patient then did well, but in 2007 developed some redness and bumpiness around the left areola. She brought this to Dr. Ermelinda [name] attention, and apparently an initial biopsy was negative, as was a mammogram, however, with further development of the change, the patient had a biopsy under anesthesia, and this apparently was positive (I do not have those records). With this information, she proceeded to left mastectomy on February 26, 2006. The pathology there ([id number]) showed dermal involvement by  adenocarcinoma, with pagetoid spread and lymphatic space invasion. The closest margin being 1.2 cm from the inferior margin. Everything else being amply negative. There were no discrete masses or lesions identified in the left breast. The patient subsequently underwent left mastectomy.   There were a series of further skin recurrences, first in November of 2008. Initially, these were resected, but later there were too many lesions to resect, in addition to a large left axillary lymph node. This was biopsied in January 2011, showing the tumor now to be [name] positive. Patient has been on anti-[name] treatments since January 2011, initially with [name] plus [name]. [name] was discontinued in April 2011 secondary to side effects (diarrhea). Continues now to receive [name] on a q. 4 weeks since jan 2015 and receives a zoledronic acid every 3 months.    INTERVAL HISTORY: [name] returns today with her daughter in law for followup of her metastatic breast cancer. Patient lives by herself in her own apartment he does have some chronic back pain because of scoliosis no recent falls. She denies any new skin problems. She does ingestion shortness breath nausea vomiting abdominal pain diarrhea headaches. Since January she has been getting Herceptin every 4 weeks and [name] every 3 months and will continue this program.  REVIEW OF SYSTEMS: She denies dizziness, gait imbalance, fainting mass or leg weakness. She says she just tripped on a dog bad 1 time and the other times she slipped in the bathroom. She cannot hear very well. She has a little bit of a running nose. She has her stress urinary incontinence, and she has her joint and back pain, none of  which aren't new symptoms and none of which are more intense or persistent than before. She's not aware of any skin issues at this point. A detailed review of systems was otherwise noncontributory.   PAST MEDICAL HISTORY: Past Medical History   Diagnosis Date  . Cancer     breast  . Cataract   . CAD (coronary artery disease)     cath w/ trivial disease  . Diverticulosis of colon   . Osteoporosis     s/p compression fx T12  . Macular degeneration   . Breast cancer     PAST SURGICAL HISTORY: Past Surgical History  Procedure Laterality Date  . Abdominal hysterectomy    . Breast lumpectomy    . Excision for paget's vulvar    . Vertbroplasty      T12  . Mastectomy      left  . Flexible sigmoidoscopy N/A 06/27/2012    Procedure: FLEXIBLE SIGMOIDOSCOPY;  Surgeon: Lafayette Dragon, MD;  Location: [facility];  Service: Endoscopy;  Laterality: N/A;    FAMILY HISTORY The patient's father died in an automobile accident at 77, the patient's mother from a stroke at 73. The patient had one brother who died from causes unknown to her at the age of 50, and a sister who had "lots of problems" and died at the age of 80, but there is no history of breast or ovarian cancer in the family.   GYNECOLOGIC HISTORY: She is GX P3. She took hormone replacement therapy briefly after menopause.   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 She has been a widow since 1997; lives independently, drives, and does all of her activities of daily living. Her daughter, [name], is a Marine scientist in [place]. Her daughter, [name], lives in [place].[name], who frequently comes with the patient to medical visits, is married to the patient's son, who is also her power of attorney. He works for the [employer]. The patient has six grandchildren and three great-grandchildren. She attends [name].    ADVANCED DIRECTIVES: In place  HEALTH MAINTENANCE: History  Substance Use Topics  . Smoking status: Former Smoker    Quit date: 05/01/1990  . Smokeless tobacco: Never Used  . Alcohol Use: No     Colonoscopy: 02/06/2002  PAP: Not on file  Bone density: Not on  file  Lipid panel: Not on file  Allergies  Allergen Reactions  . Solifenacin Succinate     REACTION: "sick" nausea    Current Outpatient Prescriptions  Medication Sig Dispense Refill  . acetaminophen (TYLENOL) 500 MG tablet Take 1,000 mg by mouth daily.       . beta carotene w/minerals (OCUVITE) tablet Take 1 tablet by mouth daily.      Marland Kitchen BIOTIN PO Take 10,000 mg by mouth daily.      . cholecalciferol (VITAMIN D) 1000 UNITS tablet Take 1,000 Units by mouth daily.      . clobetasol cream (TEMOVATE) 1.61 % Apply 1 application topically 2 (two) times daily as needed.      Marland Kitchen losartan (COZAAR) 25 MG tablet Take 1 tablet (25 mg total) by mouth daily.  90 tablet  3  . Misc Natural Products (TURMERIC CURCUMIN) CAPS Take 2 capsules by mouth daily.      . polyvinyl alcohol (LIQUIFILM TEARS) 1.4 % ophthalmic solution Place 2 drops into both eyes as needed for dry eyes.      Marland Kitchen  traMADol (ULTRAM) 50 MG tablet Take 1 tablet (50 mg total) by mouth every 6 (six) hours as needed for moderate pain.  40 tablet  1  . [name] (HERCEPTIN) 440 MG injection Inject into the vein once.        . zolendronic acid ([name]) 4 MG/5ML injection Inject 4 mg into the vein every 3 (three) months.         No current facility-administered medications for this visit.   Facility-Administered Medications Ordered in Other Visits  Medication Dose Route Frequency Provider Last Rate Last Dose  . [name] (HERCEPTIN) 294 mg in sodium chloride 0.9 % 250 mL chemo infusion  6 mg/kg (Treatment Plan Actual) Intravenous Once Chauncey Cruel, MD 528 mL/hr at 11/11/13 1327 294 mg at 11/11/13 1327    OBJECTIVE: Elderly white woman in mild distress Filed Vitals:   11/11/13 1136  BP: 168/74  Pulse: 65  Temp: 97.4 F (36.3 C)  Resp: 20     Body mass index is 19.56 kg/(m^2).    ECOG FS: 1  Sclerae unicteric, pupils equal and round  Oropharynx clear and moist No cervical or supraclavicular adenopathy Lungs no rales or  rhonchi Heart regular rate and rhythm Abd soft, nontender, positive bowel sounds MSK kyphosis but no focal spinal tenderness, no upper extremity lymphedema, no rib cage tenderness to AP or lateral mild compression Neuro: nonfocal, well oriented, appropriate affect  Breasts: the right breast is unremarkable. The left breast is status post mastectomy. There is no evidence of chest wall recurrence. There is no evidence of skin change in the left flank either. There are some telangiectasias secondary to the prior chest wall irradiation. The left axilla is benign    LAB RESULTS: Lab Results  Component Value Date   WBC 4.1 11/11/2013   NEUTROABS 2.6 11/11/2013   HGB 13.5 11/11/2013   HCT 42.3 11/11/2013   MCV 92.0 11/11/2013   PLT 153 11/11/2013      Chemistry      Component Value Date/Time   NA 142 11/11/2013 1114   NA 139 06/27/2012 0414   K 4.1 11/11/2013 1114   K 3.3* 06/27/2012 0414   CL 106 10/15/2012 1136   CL 109 06/27/2012 0414   CO2 23 11/11/2013 1114   CO2 21 06/27/2012 0414   BUN 14.6 11/11/2013 1114   BUN 7 06/27/2012 0414   CREATININE 0.9 11/11/2013 1114   CREATININE 0.67 06/27/2012 0414      Component Value Date/Time   CALCIUM 10.2 11/11/2013 1114   CALCIUM 8.9 06/27/2012 0414   ALKPHOS 57 11/11/2013 1114   ALKPHOS 43 06/27/2012 0414   AST 29 11/11/2013 1114   AST 34 06/27/2012 0414   ALT 12 11/11/2013 1114   ALT 18 06/27/2012 0414   BILITOT 0.58 11/11/2013 1114   BILITOT 0.2* 06/27/2012 0414      STUDIES: Study Result    CLINICAL DATA: Breast cancer. Increase left-sided chest and rib  pain  EXAM:  NUCLEAR MEDICINE WHOLE BODY BONE SCAN  TECHNIQUE:  Whole body anterior and posterior images were obtained approximately  3 hours after intravenous injection of radiopharmaceutical.  COMPARISON: Bone scan 02/17/2011, plain films 11/20/2012  RADIOPHARMACEUTICALS: 26.0 mCi Technetium99 MDP  FINDINGS:  Multiple new foci of abnormal radiotracer uptake at the junction of  the right  posterior ribs in the vertebral bodies involving the 5th,  6, 7, 8) ribs. Uptake in the posterior 10 rib is also new. This is  in a pattern suggestive trauma.  More linear uptake within the  anterior medial 5th rib on the left is more concerning for  metastasis. Degenerate uptake noted in the lower lumbar spine. No  abnormal uptake within the appendicular skeleton to suggest  metastasis.  IMPRESSION:  1. Traumatic pattern uptake radiotracer uptake within the posterior  right ribs suggest fractures  2. More linear uptake involving the anterior left 5th rib is  concerning for metastasis although cannot exclude trauma.  5. A sodium fluoride PET-CT bone scan may be beneficial for further  characterization.  Electronically Signed  By: Suzy Bouchard M.D.  On: 02/19/2013 14:40       ------------------------------------------------------------ Echocardiography ------------------------------------------------------------ LV EF: 55% - 60%   Left ventricle: Normal global LV strain ta -23.4% The cavity size was normal. Systolic function was normal. The estimated ejection fraction was in the range of 55% to 60%. Wall motion was normal; there were no regional wall motion abnormalities. - Aortic valve: Mild diffuse thickening and calcification, consistent with sclerosis. - Aorta: Aortic root dimension: 47mm (ED). - Ascending aorta: The ascending aorta was mildly dilated. - Mitral valve: Moderate regurgitation. - Tricuspid valve: Mild-moderate regurgitation. Marland Kitchen  ------------------------------------------------------------ .  ------------------------------------------------------------ Aortic valve: Poorly visualized. Trileaflet. Mild diffuse thickening and calcification, consistent with sclerosis. Mobility was not restricted. Doppler: Transvalvular velocity was within the normal range. There was no stenosis. No  regurgitation.  ------------------------------------------------------------ Aorta: Aortic root: The aortic root was normal in size. Ascending aorta: The ascending aorta was mildly dilated.  ------------------------------------------------------------ Mitral valve: Mildly thickened leaflets . Mobility was not restricted. Doppler: Transvalvular velocity was within the normal range. There was no evidence for stenosis. Moderate regurgitation.  ------------------------------------------------------------ Left atrium: The atrium was normal in size.  ------------------------------------------------------------ Right ventricle: The cavity size was normal. Wall thickness was normal. Systolic function was normal.  ------------------------------------------------------------ Pulmonic valve: Structurally normal valve. Cusp separation was normal. Doppler: Transvalvular velocity was within the normal range. There was no evidence for stenosis. No regurgitation.  ------------------------------------------------------------ Tricuspid valve: Structurally normal valve. Doppler: Transvalvular velocity was within the normal range. Mild-moderate regurgitation.  ------------------------------------------------------------ Pulmonary artery: The main pulmonary artery was normal-sized. Systolic pressure was within the normal range.  ------------------------------------------------------------ Right atrium: The atrium was normal in size.  ------------------------------------------------------------ Pericardium: There was no pericardial effusion.  ------------------------------------------------------------ Systemic veins: Inferior vena cava: The vessel was normal in size.  -----------------------------------------------------------  ----------------------------------------------------------- Prepared and Electronically Authenticated by  Fransico Him, MD     ASSESSMENT: Ms. Ige is a 80  y.o.  [place], [place] woman:  (1) Status post left lumpectomy and sentinel lymph node dissection in 09/2002 for a T2 N1 (Stage II) invasive ductal carcinoma, triple negative, status post radiation therapy completed in 01/2003.  (2) Local recurrence around the areola 01/2006, leading to mastectomy.  (3) A series of further skin recurrences first noted in 03/2007. Initially these were resected, later with too many lesions to resect; there was also a large left axillary lymph node which was biopsied in 04/2009 showing the tumor now to be HER-2 positive.   (4) Since 04/2009 she has been on anti-HER-2 treatment, initially with [name] plus [name], with a [name] discontinued 07/2009 because of side effects.   (5) Recurrence of vulvar Paget's disease, originally diagnosed and resected in 2004 and recurred in 02/2012.  Followed by Dr. Paula Compton and Dr. Marti Sleigh.  (6) Continues to receive [name] every 4 weeks and zoledronic acid every 3 months. Most recent echo April 2015 showed an ejection fraction in the 55-60% range.  PLAN: #1 continue Herceptin as maintenance therapy once a month. #2 continue [name] every 3 months. #3 patient to have labs and followup 3 months. #4 clinically she is in remission and no signs of cutaneous metastasis. #5 echocardiogram results were reviewed and adequate to continue Herceptin based therapy. #6 we will consider doing another restaging scan in about 6 months.   Bernadene Bell, MD Medical Hematologist/Oncologist [place] Pager: [phone] Office No: [phone] 11/11/2013 1:34 PM

## 2013-11-11 NOTE — Patient Instructions (Signed)
[facility] Discharge Instructions for Patients Receiving Chemotherapy  Today you received the following chemotherapy agents Herceptin  To help prevent nausea and vomiting after your treatment, we encourage you to take your nausea medication     If you develop nausea and vomiting that is not controlled by your nausea medication, call the clinic.   BELOW ARE SYMPTOMS THAT SHOULD BE REPORTED IMMEDIATELY:  *FEVER GREATER THAN 100.5 F  *CHILLS WITH OR WITHOUT FEVER  NAUSEA AND VOMITING THAT IS NOT CONTROLLED WITH YOUR NAUSEA MEDICATION  *UNUSUAL SHORTNESS OF BREATH  *UNUSUAL BRUISING OR BLEEDING  TENDERNESS IN MOUTH AND THROAT WITH OR WITHOUT PRESENCE OF ULCERS  *URINARY PROBLEMS  *BOWEL PROBLEMS  UNUSUAL RASH Items with * indicate a potential emergency and should be followed up as soon as possible.  Feel free to call the clinic you have any questions or concerns. The clinic phone number is [phone].    

## 2013-11-24 ENCOUNTER — Telehealth: Payer: Self-pay | Admitting: Internal Medicine

## 2013-11-24 DIAGNOSIS — H9193 Unspecified hearing loss, bilateral: Secondary | ICD-10-CM

## 2013-11-24 NOTE — Telephone Encounter (Signed)
Pt request refill for hearing specialist, pt stated that Dr.Lisa G Fox-Thomas department of communication and disorder at [facility] is welling to work with pt but they need a referral from our office. [phone] phone # for them.

## 2013-11-25 NOTE — Telephone Encounter (Signed)
Order for "audiology" done - pcc to arrange

## 2013-12-02 NOTE — Telephone Encounter (Signed)
I have left msg for [employer] @ [facility]. Awaiting return call.

## 2013-12-08 ENCOUNTER — Other Ambulatory Visit: Payer: Self-pay | Admitting: *Deleted

## 2013-12-09 ENCOUNTER — Other Ambulatory Visit (HOSPITAL_BASED_OUTPATIENT_CLINIC_OR_DEPARTMENT_OTHER): Payer: Medicare Other

## 2013-12-09 ENCOUNTER — Ambulatory Visit (HOSPITAL_BASED_OUTPATIENT_CLINIC_OR_DEPARTMENT_OTHER): Payer: Medicare Other

## 2013-12-09 ENCOUNTER — Encounter: Payer: Self-pay | Admitting: Pharmacist

## 2013-12-09 ENCOUNTER — Other Ambulatory Visit: Payer: Self-pay

## 2013-12-09 ENCOUNTER — Other Ambulatory Visit: Payer: Self-pay | Admitting: Hematology

## 2013-12-09 VITALS — BP 150/74 | HR 52 | Temp 97.7°F

## 2013-12-09 DIAGNOSIS — C50912 Malignant neoplasm of unspecified site of left female breast: Secondary | ICD-10-CM

## 2013-12-09 DIAGNOSIS — C50919 Malignant neoplasm of unspecified site of unspecified female breast: Secondary | ICD-10-CM

## 2013-12-09 DIAGNOSIS — C773 Secondary and unspecified malignant neoplasm of axilla and upper limb lymph nodes: Secondary | ICD-10-CM

## 2013-12-09 DIAGNOSIS — Z5112 Encounter for antineoplastic immunotherapy: Secondary | ICD-10-CM

## 2013-12-09 DIAGNOSIS — M810 Age-related osteoporosis without current pathological fracture: Secondary | ICD-10-CM

## 2013-12-09 LAB — COMPREHENSIVE METABOLIC PANEL
ALT: 12 U/L (ref 0–35)
AST: 27 U/L (ref 0–37)
Albumin: 4 g/dL (ref 3.5–5.2)
Alkaline Phosphatase: 61 U/L (ref 39–117)
BILIRUBIN TOTAL: 0.3 mg/dL (ref 0.2–1.2)
BUN: 16 mg/dL (ref 6–23)
CO2: 26 meq/L (ref 19–32)
CREATININE: 0.84 mg/dL (ref 0.50–1.10)
Calcium: 10.3 mg/dL (ref 8.4–10.5)
Chloride: 104 mEq/L (ref 96–112)
Glucose, Bld: 84 mg/dL (ref 70–99)
Potassium: 4.1 mEq/L (ref 3.5–5.3)
Sodium: 141 mEq/L (ref 135–145)
Total Protein: 7.4 g/dL (ref 6.0–8.3)

## 2013-12-09 LAB — CBC WITH DIFFERENTIAL/PLATELET
BASO%: 1 % (ref 0.0–2.0)
BASOS ABS: 0 10*3/uL (ref 0.0–0.1)
EOS%: 4.5 % (ref 0.0–7.0)
Eosinophils Absolute: 0.2 10*3/uL (ref 0.0–0.5)
HEMATOCRIT: 42.1 % (ref 34.8–46.6)
HEMOGLOBIN: 13.2 g/dL (ref 11.6–15.9)
LYMPH#: 0.9 10*3/uL (ref 0.9–3.3)
LYMPH%: 21.4 % (ref 14.0–49.7)
MCH: 29 pg (ref 25.1–34.0)
MCHC: 31.4 g/dL — AB (ref 31.5–36.0)
MCV: 92.4 fL (ref 79.5–101.0)
MONO#: 0.4 10*3/uL (ref 0.1–0.9)
MONO%: 9.7 % (ref 0.0–14.0)
NEUT#: 2.8 10*3/uL (ref 1.5–6.5)
NEUT%: 63.4 % (ref 38.4–76.8)
PLATELETS: 162 10*3/uL (ref 145–400)
RBC: 4.55 10*6/uL (ref 3.70–5.45)
RDW: 14.3 % (ref 11.2–14.5)
WBC: 4.4 10*3/uL (ref 3.9–10.3)

## 2013-12-09 MED ORDER — TRASTUZUMAB CHEMO INJECTION 440 MG
6.0000 mg/kg | Freq: Once | INTRAVENOUS | Status: AC
  Administered 2013-12-09: 294 mg via INTRAVENOUS
  Filled 2013-12-09: qty 14

## 2013-12-09 MED ORDER — SODIUM CHLORIDE 0.9 % IV SOLN
3.0000 mg | Freq: Once | INTRAVENOUS | Status: AC
  Administered 2013-12-09: 3 mg via INTRAVENOUS
  Filled 2013-12-09: qty 3.75

## 2013-12-09 MED ORDER — SODIUM CHLORIDE 0.9 % IV SOLN
Freq: Once | INTRAVENOUS | Status: AC
  Administered 2013-12-09: 13:00:00 via INTRAVENOUS

## 2013-12-09 MED ORDER — ACETAMINOPHEN 325 MG PO TABS
ORAL_TABLET | ORAL | Status: AC
  Filled 2013-12-09: qty 2

## 2013-12-09 MED ORDER — ACETAMINOPHEN 325 MG PO TABS
650.0000 mg | ORAL_TABLET | Freq: Once | ORAL | Status: AC
  Administered 2013-12-09: 650 mg via ORAL

## 2013-12-09 NOTE — Patient Instructions (Signed)

## 2013-12-11 ENCOUNTER — Telehealth: Payer: Self-pay | Admitting: Oncology

## 2013-12-11 NOTE — Telephone Encounter (Signed)
s/w pt re appt for 9/8 @ AM echo/mcclain. per [facility] in order for echo to be read by dr bensimhon pt has to be seen by him or their office. also next available for echo bensimhon is 9/16 and next tx is 9/9. pt scheduled w/dr mclain for 9/8 with echo. schedule mailed.

## 2013-12-14 ENCOUNTER — Other Ambulatory Visit: Payer: Self-pay | Admitting: Internal Medicine

## 2013-12-14 DIAGNOSIS — H9190 Unspecified hearing loss, unspecified ear: Secondary | ICD-10-CM

## 2013-12-28 ENCOUNTER — Ambulatory Visit (INDEPENDENT_AMBULATORY_CARE_PROVIDER_SITE_OTHER): Payer: Medicare Other | Admitting: Family Medicine

## 2013-12-28 ENCOUNTER — Telehealth: Payer: Self-pay | Admitting: Internal Medicine

## 2013-12-28 ENCOUNTER — Encounter: Payer: Self-pay | Admitting: Family Medicine

## 2013-12-28 VITALS — BP 132/82 | HR 70 | Temp 97.7°F | Ht 61.46 in | Wt 106.0 lb

## 2013-12-28 DIAGNOSIS — I2510 Atherosclerotic heart disease of native coronary artery without angina pectoris: Secondary | ICD-10-CM

## 2013-12-28 DIAGNOSIS — T148 Other injury of unspecified body region: Secondary | ICD-10-CM

## 2013-12-28 DIAGNOSIS — W57XXXA Bitten or stung by nonvenomous insect and other nonvenomous arthropods, initial encounter: Secondary | ICD-10-CM

## 2013-12-28 MED ORDER — TRIAMCINOLONE ACETONIDE 0.1 % EX CREA
1.0000 | TOPICAL_CREAM | Freq: Two times a day (BID) | CUTANEOUS | Status: DC
Start: 2013-12-28 — End: 2014-08-13

## 2013-12-28 NOTE — Progress Notes (Signed)
No chief complaint on file.   HPI:  Acute visit for itchy rash: -intermittent for a number of weeks -on legs -few itchy bumps that then resolve and a few new ones pop out -lives in an apartment and they stopped exterminating  -sits on porch for a few hours several days per week -no other symptoms or concerns  ROS: See pertinent positives and negatives per HPI.  Past Medical History  Diagnosis Date  . Cancer     breast  . Cataract   . CAD (coronary artery disease)     cath w/ trivial disease  . Diverticulosis of colon   . Osteoporosis     s/p compression fx T12  . Macular degeneration   . Breast cancer     Past Surgical History  Procedure Laterality Date  . Abdominal hysterectomy    . Breast lumpectomy    . Excision for paget's vulvar    . Vertbroplasty      T12  . Mastectomy      left  . Flexible sigmoidoscopy N/A 06/27/2012    Procedure: FLEXIBLE SIGMOIDOSCOPY;  Surgeon: Lafayette Dragon, MD;  Location: [facility];  Service: Endoscopy;  Laterality: N/A;    Family History  Problem Relation Age of Onset  . Cancer Sister   . Hypertension Mother     History   Social History  . Marital Status: Widowed    Spouse Name: N/A    Number of Children: N/A  . Years of Education: N/A   Social History Main Topics  . Smoking status: Former Smoker    Quit date: 05/01/1990  . Smokeless tobacco: Never Used  . Alcohol Use: No  . Drug Use: No  . Sexual Activity: No   Other Topics Concern  . None   Social History Narrative   HSG, no college. Married '44- '97 widowed. 2 dtrs - '46, '54, 1 son - '49; 6 grandchildren; 5 great-grands. 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 She lives alone.    End of life care: Do not resuscitate; willing to have intensive care excluding mechanical ventilation.     Current outpatient prescriptions:acetaminophen (TYLENOL) 500 MG tablet, Take 1,000 mg by mouth daily. , Disp: , Rfl: ;  beta carotene w/minerals  (OCUVITE) tablet, Take 1 tablet by mouth daily., Disp: , Rfl: ;  BIOTIN PO, Take 10,000 mg by mouth daily., Disp: , Rfl: ;  cholecalciferol (VITAMIN D) 1000 UNITS tablet, Take 1,000 Units by mouth daily., Disp: , Rfl:  clobetasol cream (TEMOVATE) 1.61 %, Apply 1 application topically 2 (two) times daily as needed., Disp: , Rfl: ;  losartan (COZAAR) 25 MG tablet, Take 1 tablet (25 mg total) by mouth daily., Disp: 90 tablet, Rfl: 3;  trastuzumab (HERCEPTIN) 440 MG injection, Inject into the vein once.  , Disp: , Rfl: ;  zolendronic acid (ZOMETA) 4 MG/5ML injection, Inject 4 mg into the vein every 3 (three) months.  , Disp: , Rfl:  triamcinolone cream (KENALOG) 0.1 %, Apply 1 application topically 2 (two) times daily., Disp: 30 g, Rfl: 0  EXAM:  Filed Vitals:   12/28/13 1308  BP: 132/82  Pulse: 70  Temp: 97.7 F (36.5 C)    Body mass index is 19.73 kg/(m^2).  GENERAL: vitals reviewed and listed above, alert, oriented, appears well hydrated and in no acute distress  HEENT: atraumatic, conjunttiva clear, no obvious abnormalities on inspection of external nose and ears  NECK: no obvious masses on inspection  SKIN: 2 erythematous inderated raise lesions approx 8-12 mm in diameter, one on post ankle area and on on posterior thigh R leg  MS: moves all extremities without noticeable abnormality  PSYCH: pleasant and cooperative, no obvious depression or anxiety  ASSESSMENT AND PLAN:  Discussed the following assessment and plan:  Insect bites - Plan: triamcinolone cream (KENALOG) 0.1 %  -discussed likely insect bites and suspect flea, nat or mosquito most likely -Patient advised to return or notify a doctor immediately if symptoms worsen or persist or new concerns arise.  Patient Instructions  -use the cream provided twive daily for a few days on any itchy bumps  -wear bug spray when you go outside and on the porch  -if not resolving call exterminator to check home and follow up with  Dr. Tresa Endo, [place].

## 2013-12-28 NOTE — Patient Instructions (Addendum)
-  use the cream provided twice daily for a few days on any itchy bumps  -wear bug spray when you go outside and on the porch  -if not resolving call exterminator to check home and follow up with Dr. Asa Lente

## 2013-12-28 NOTE — Telephone Encounter (Signed)
Patient Information:  Caller Name: [place]  Phone: [phone]  Patient: [name], [name]  Gender: Female  DOB: 1913-12-18  Age: 80 Years  PCP: Gwendolyn Grant (Adults only)  Office Follow Up:  Does the office need to follow up with this patient?: No  Instructions For The Office: N/A  RN Note:  Appt scheduled for today(12/28/13) @ 1300 with Dr Maudie Mercury at the [facility] office. Pt isn't sure if she can get a ride to appt but will call back to cancel if she can't.  Symptoms  Reason For Call & Symptoms: Calling about itchy rash on back of legs that is painful to the touch. Has had rash for 2 months off and on but has gotten worse over the last few days. Wants an appt to be seen today. Gets IV Herceptin chemo treatment once a month but has been getting med for 4 years.  Reviewed Health History In EMR: Yes  Reviewed Medications In EMR: Yes  Reviewed Allergies In EMR: Yes  Reviewed Surgeries / Procedures: Yes  Date of Onset of Symptoms: 12/17/2013  Treatments Tried: Cortisone 10 cream,cool cloth  Treatments Tried Worked: No  Guideline(s) Used:  Rash or Redness - Localized  Disposition Per Guideline:   See Today or Tomorrow in Office  Reason For Disposition Reached:   Severe local itching persists after 2 days of steroid cream and antihistamines  Advice Given:  N/A  Patient Will Follow Care Advice:  YES  Appointment Scheduled:  12/28/2013 13:00:00 Appointment Scheduled Provider:  Other

## 2013-12-28 NOTE — Progress Notes (Signed)
Pre visit review using our clinic review tool, if applicable. No additional management support is needed unless otherwise documented below in the visit note. 

## 2014-01-05 ENCOUNTER — Ambulatory Visit (HOSPITAL_BASED_OUTPATIENT_CLINIC_OR_DEPARTMENT_OTHER)
Admission: RE | Admit: 2014-01-05 | Discharge: 2014-01-05 | Disposition: A | Discharge status: Home / Self Care | Payer: Medicare Other | Source: Ambulatory Visit | Attending: Internal Medicine | Admitting: Internal Medicine

## 2014-01-05 ENCOUNTER — Ambulatory Visit (HOSPITAL_COMMUNITY)
Admission: RE | Admit: 2014-01-05 | Discharge: 2014-01-05 | Disposition: A | Discharge status: Home / Self Care | Payer: Medicare Other | Source: Ambulatory Visit | Attending: Internal Medicine | Admitting: Internal Medicine

## 2014-01-05 VITALS — BP 142/78 | HR 73 | Wt 104.5 lb

## 2014-01-05 DIAGNOSIS — I2510 Atherosclerotic heart disease of native coronary artery without angina pectoris: Secondary | ICD-10-CM | POA: Diagnosis not present

## 2014-01-05 DIAGNOSIS — I080 Rheumatic disorders of both mitral and aortic valves: Secondary | ICD-10-CM | POA: Diagnosis not present

## 2014-01-05 DIAGNOSIS — I10 Essential (primary) hypertension: Secondary | ICD-10-CM

## 2014-01-05 DIAGNOSIS — Z87891 Personal history of nicotine dependence: Secondary | ICD-10-CM | POA: Diagnosis not present

## 2014-01-05 DIAGNOSIS — C50912 Malignant neoplasm of unspecified site of left female breast: Secondary | ICD-10-CM

## 2014-01-05 DIAGNOSIS — Z9010 Acquired absence of unspecified breast and nipple: Secondary | ICD-10-CM | POA: Insufficient documentation

## 2014-01-05 DIAGNOSIS — C50919 Malignant neoplasm of unspecified site of unspecified female breast: Secondary | ICD-10-CM | POA: Diagnosis not present

## 2014-01-05 DIAGNOSIS — I359 Nonrheumatic aortic valve disorder, unspecified: Secondary | ICD-10-CM

## 2014-01-05 NOTE — Progress Notes (Signed)
  Echocardiogram 2D Echocardiogram has been performed.  [name] 01/05/2014, 9:15 AM

## 2014-01-05 NOTE — Progress Notes (Signed)
Patient ID: [name], female   DOB: 04-15-1914, 80 y.o.   MRN: [id number] Oncologist: Dr Jana Hakim  HPI: [name] is a delightful 80 y/o woman who had LHC in 2000 with no significant CAD. Found to have breast CA in 2004. Initial lumpectomy in 2004. There was evidence of recurrence. Underwent mastectomy in 2006 at [facility]. Then treated with XRT. Had recurrence in L axillary node in 2010. Apparently, the initial tumor was negative for ER/PR/Her 2-neu receptors. However lymph node HER 2-neu +, thought to be mutation.   Started on Tykerb and Herceptin. Treated with Tykerb x 6 months.  Continues on Herceptin q 3 weeks (4 years in at this time).   Echos  08/2010: EF 55-60% lat s' hard to see about 8 cm/s  03/2011: EF 55-60% lateral s' velocity 9.2 cm/s 07/2011: EF 55-60% lateral s' 9.3-9.5 cm/s (difficult window) 10/2011:  EF 55-60% lateral s' 8.9, 9.2 cm/s 03/20/12 EF 55-60% lateral S' 9.2 09/08/12 EF  55-60% lateral S'11.0 cm/s 12/08/12 EF 55-60% lateral s' 10.1 9/15 EF 55%, lateral s' 13.5, GLS -22%  Follow up: Denies CP or SOB. BP has been ok.  She has had a couple of falls since last appointment, most recently in 5/15 but no fractures.  She has gone back to walking up to 2 miles at a time.    Past Medical History  Diagnosis Date  . Cancer     breast  . Cataract   . CAD (coronary artery disease)     cath w/ trivial disease  . Diverticulosis of colon   . Osteoporosis     s/p compression fx T12  . Macular degeneration   . Breast cancer    Current Outpatient Prescriptions on File Prior to Encounter  Medication Sig Dispense Refill  . acetaminophen (TYLENOL) 500 MG tablet Take 1,000 mg by mouth daily.       . beta carotene w/minerals (OCUVITE) tablet Take 1 tablet by mouth daily.      Marland Kitchen BIOTIN PO Take 10,000 mg by mouth daily.      . cholecalciferol (VITAMIN D) 1000 UNITS tablet Take 1,000 Units by mouth daily.      . clobetasol cream (TEMOVATE) 8.25 % Apply 1 application topically 2  (two) times daily as needed.      Marland Kitchen losartan (COZAAR) 25 MG tablet Take 1 tablet (25 mg total) by mouth daily.  90 tablet  3  . trastuzumab (HERCEPTIN) 440 MG injection Inject into the vein once.        . triamcinolone cream (KENALOG) 0.1 % Apply 1 application topically 2 (two) times daily.  30 g  0  . zolendronic acid (ZOMETA) 4 MG/5ML injection Inject 4 mg into the vein every 3 (three) months.         No current facility-administered medications on file prior to encounter.    Review of Systems: System review is negative for any constitutional, cardiac, pulmonary, GI or neuro symptoms or complaints other than as described in the HPI.     Objective:    Filed Vitals:   01/05/14 0904  BP: 142/78  Pulse: 73  Weight: 104 lb 8 oz (47.401 kg)  SpO2: 93%   Physical Exam: General: Elderly Well appearing. No resp difficulty Son present HEENT: normal Neck: supple. JVD flat. Carotids 2+ bilat; no bruits. No lymphadenopathy or thryomegaly appreciated. Cor: PMI nondisplaced. Regular rate & rhythm. Soft diastolic murmur at RSB Lungs: clear Abdomen: soft, nontender, nondistended. No hepatosplenomegaly. No bruits  or masses. Good bowel sounds. Extremities: no cyanosis, clubbing, rash, edema Neuro: alert & orientedx3, cranial nerves grossly intact. moves all 4 extremities w/o difficulty. Affect pleasant. Strength in legs and hip flexors/extenreds are normal     Assessment & Plan:   1) Breast Cancer: I reviewed today's echo.  Parameters all remain stable compared to prior studies long-term.  She is doing well overall.  At this point, I think that we can move echoes out to every 4 months, so will repeat echo and followup in 4 months.  2) HTN: BP seems reasonably controlled.   Chet Greenley,MD 01/05/2014

## 2014-01-05 NOTE — Addendum Note (Signed)
Encounter addended by: Scarlette Calico, RN on: 01/05/2014 10:11 AM<BR>     Documentation filed: Orders

## 2014-01-05 NOTE — Patient Instructions (Signed)
Your physician recommends that you schedule a follow-up appointment in: 4 months with echocardiogram  

## 2014-01-06 ENCOUNTER — Other Ambulatory Visit: Payer: Self-pay

## 2014-01-06 ENCOUNTER — Ambulatory Visit (HOSPITAL_BASED_OUTPATIENT_CLINIC_OR_DEPARTMENT_OTHER): Payer: Medicare Other

## 2014-01-06 ENCOUNTER — Other Ambulatory Visit: Payer: Self-pay | Admitting: Oncology

## 2014-01-06 ENCOUNTER — Other Ambulatory Visit (HOSPITAL_BASED_OUTPATIENT_CLINIC_OR_DEPARTMENT_OTHER): Payer: Medicare Other

## 2014-01-06 DIAGNOSIS — C50919 Malignant neoplasm of unspecified site of unspecified female breast: Secondary | ICD-10-CM

## 2014-01-06 DIAGNOSIS — C50912 Malignant neoplasm of unspecified site of left female breast: Secondary | ICD-10-CM

## 2014-01-06 DIAGNOSIS — C773 Secondary and unspecified malignant neoplasm of axilla and upper limb lymph nodes: Secondary | ICD-10-CM

## 2014-01-06 DIAGNOSIS — Z5112 Encounter for antineoplastic immunotherapy: Secondary | ICD-10-CM

## 2014-01-06 LAB — CBC WITH DIFFERENTIAL/PLATELET
BASO%: 0.7 % (ref 0.0–2.0)
BASOS ABS: 0 10*3/uL (ref 0.0–0.1)
EOS%: 4.4 % (ref 0.0–7.0)
Eosinophils Absolute: 0.2 10*3/uL (ref 0.0–0.5)
HCT: 43.4 % (ref 34.8–46.6)
HGB: 13.7 g/dL (ref 11.6–15.9)
LYMPH%: 22.3 % (ref 14.0–49.7)
MCH: 29.5 pg (ref 25.1–34.0)
MCHC: 31.6 g/dL (ref 31.5–36.0)
MCV: 93.2 fL (ref 79.5–101.0)
MONO#: 0.5 10*3/uL (ref 0.1–0.9)
MONO%: 11.2 % (ref 0.0–14.0)
NEUT%: 61.4 % (ref 38.4–76.8)
NEUTROS ABS: 3 10*3/uL (ref 1.5–6.5)
Platelets: 157 10*3/uL (ref 145–400)
RBC: 4.66 10*6/uL (ref 3.70–5.45)
RDW: 14.5 % (ref 11.2–14.5)
WBC: 4.8 10*3/uL (ref 3.9–10.3)
lymph#: 1.1 10*3/uL (ref 0.9–3.3)

## 2014-01-06 LAB — COMPREHENSIVE METABOLIC PANEL (CC13)
ALK PHOS: 62 U/L (ref 40–150)
ALT: 11 U/L (ref 0–55)
AST: 27 U/L (ref 5–34)
Albumin: 4 g/dL (ref 3.5–5.0)
Anion Gap: 9 mEq/L (ref 3–11)
BILIRUBIN TOTAL: 0.44 mg/dL (ref 0.20–1.20)
BUN: 15.5 mg/dL (ref 7.0–26.0)
CALCIUM: 9.8 mg/dL (ref 8.4–10.4)
CO2: 23 mEq/L (ref 22–29)
CREATININE: 0.9 mg/dL (ref 0.6–1.1)
Chloride: 110 mEq/L — ABNORMAL HIGH (ref 98–109)
Glucose: 82 mg/dl (ref 70–140)
Potassium: 4.4 mEq/L (ref 3.5–5.1)
Sodium: 141 mEq/L (ref 136–145)
Total Protein: 7.1 g/dL (ref 6.4–8.3)

## 2014-01-06 MED ORDER — ACETAMINOPHEN 325 MG PO TABS
650.0000 mg | ORAL_TABLET | Freq: Once | ORAL | Status: AC
  Administered 2014-01-06: 650 mg via ORAL

## 2014-01-06 MED ORDER — ACETAMINOPHEN 325 MG PO TABS
ORAL_TABLET | ORAL | Status: AC
  Filled 2014-01-06: qty 2

## 2014-01-06 MED ORDER — TRASTUZUMAB CHEMO INJECTION 440 MG
6.0000 mg/kg | Freq: Once | INTRAVENOUS | Status: AC
  Administered 2014-01-06: 294 mg via INTRAVENOUS
  Filled 2014-01-06: qty 14

## 2014-01-06 MED ORDER — SODIUM CHLORIDE 0.9 % IV SOLN
Freq: Once | INTRAVENOUS | Status: AC
  Administered 2014-01-06: 14:00:00 via INTRAVENOUS

## 2014-01-06 NOTE — Patient Instructions (Signed)
[facility] Discharge Instructions for Patients Receiving Chemotherapy  Today you received the following chemotherapy agents Herceptin.  To help prevent nausea and vomiting after your treatment, we encourage you to take your nausea medication as directed.   If you develop nausea and vomiting that is not controlled by your nausea medication, call the clinic.   BELOW ARE SYMPTOMS THAT SHOULD BE REPORTED IMMEDIATELY:  *FEVER GREATER THAN 100.5 F  *CHILLS WITH OR WITHOUT FEVER  NAUSEA AND VOMITING THAT IS NOT CONTROLLED WITH YOUR NAUSEA MEDICATION  *UNUSUAL SHORTNESS OF BREATH  *UNUSUAL BRUISING OR BLEEDING  TENDERNESS IN MOUTH AND THROAT WITH OR WITHOUT PRESENCE OF ULCERS  *URINARY PROBLEMS  *BOWEL PROBLEMS  UNUSUAL RASH Items with * indicate a potential emergency and should be followed up as soon as possible.  Feel free to call the clinic you have any questions or concerns. The clinic phone number is [phone].  

## 2014-01-08 ENCOUNTER — Other Ambulatory Visit: Payer: Self-pay | Admitting: *Deleted

## 2014-01-08 DIAGNOSIS — C50912 Malignant neoplasm of unspecified site of left female breast: Secondary | ICD-10-CM

## 2014-01-11 ENCOUNTER — Ambulatory Visit: Payer: Medicare Other | Admitting: Internal Medicine

## 2014-01-11 ENCOUNTER — Telehealth (HOSPITAL_COMMUNITY): Payer: Self-pay | Admitting: Vascular Surgery

## 2014-01-11 DIAGNOSIS — I10 Essential (primary) hypertension: Secondary | ICD-10-CM

## 2014-01-11 MED ORDER — LOSARTAN POTASSIUM 25 MG PO TABS: 25.0000 mg | ORAL_TABLET | Freq: Every day | ORAL | Status: DC

## 2014-01-11 NOTE — Telephone Encounter (Signed)
Pt refill on Losartan

## 2014-01-11 NOTE — Telephone Encounter (Signed)
As requested meds sent into pharmacy 

## 2014-01-19 ENCOUNTER — Other Ambulatory Visit: Payer: Self-pay | Admitting: *Deleted

## 2014-01-20 ENCOUNTER — Telehealth: Payer: Self-pay | Admitting: Internal Medicine

## 2014-01-20 NOTE — Telephone Encounter (Signed)
Pt son called to say that his mom was a pt of Dr Linda Hedges and she transferred to Dr Asa Lente. He said Dr Asa Lente has cut hours back and suggested she found another doctor. He said she was once a pt of Dr Regis Bill and would like to know if Dr Regis Bill would except her as a patient. Patient has medicare

## 2014-01-25 NOTE — Telephone Encounter (Signed)
Not at this time   May try one of the newer physicians  Who have capacity

## 2014-01-27 NOTE — Telephone Encounter (Signed)
Lm with son wife advising him of Dr Regis Bill decision .Marland Kitchen

## 2014-02-03 ENCOUNTER — Ambulatory Visit (HOSPITAL_BASED_OUTPATIENT_CLINIC_OR_DEPARTMENT_OTHER): Payer: Medicare Other

## 2014-02-03 ENCOUNTER — Telehealth: Payer: Self-pay | Admitting: Oncology

## 2014-02-03 ENCOUNTER — Other Ambulatory Visit (HOSPITAL_BASED_OUTPATIENT_CLINIC_OR_DEPARTMENT_OTHER): Payer: Medicare Other

## 2014-02-03 ENCOUNTER — Telehealth: Payer: Self-pay | Admitting: *Deleted

## 2014-02-03 ENCOUNTER — Ambulatory Visit (HOSPITAL_BASED_OUTPATIENT_CLINIC_OR_DEPARTMENT_OTHER): Payer: Medicare Other | Admitting: Oncology

## 2014-02-03 VITALS — BP 165/64 | HR 64 | Temp 97.8°F | Resp 16 | Ht 61.0 in | Wt 104.4 lb

## 2014-02-03 DIAGNOSIS — C50912 Malignant neoplasm of unspecified site of left female breast: Secondary | ICD-10-CM

## 2014-02-03 DIAGNOSIS — C773 Secondary and unspecified malignant neoplasm of axilla and upper limb lymph nodes: Secondary | ICD-10-CM

## 2014-02-03 DIAGNOSIS — I2510 Atherosclerotic heart disease of native coronary artery without angina pectoris: Secondary | ICD-10-CM

## 2014-02-03 DIAGNOSIS — Z5112 Encounter for antineoplastic immunotherapy: Secondary | ICD-10-CM

## 2014-02-03 DIAGNOSIS — C7989 Secondary malignant neoplasm of other specified sites: Secondary | ICD-10-CM

## 2014-02-03 LAB — COMPREHENSIVE METABOLIC PANEL (CC13)
ALBUMIN: 4 g/dL (ref 3.5–5.0)
ALT: 15 U/L (ref 0–55)
ANION GAP: 8 meq/L (ref 3–11)
AST: 26 U/L (ref 5–34)
Alkaline Phosphatase: 62 U/L (ref 40–150)
BILIRUBIN TOTAL: 0.64 mg/dL (ref 0.20–1.20)
BUN: 11.7 mg/dL (ref 7.0–26.0)
CO2: 25 mEq/L (ref 22–29)
Calcium: 10.3 mg/dL (ref 8.4–10.4)
Chloride: 108 mEq/L (ref 98–109)
Creatinine: 0.8 mg/dL (ref 0.6–1.1)
GLUCOSE: 84 mg/dL (ref 70–140)
POTASSIUM: 4.5 meq/L (ref 3.5–5.1)
SODIUM: 142 meq/L (ref 136–145)
TOTAL PROTEIN: 7.3 g/dL (ref 6.4–8.3)

## 2014-02-03 LAB — CBC WITH DIFFERENTIAL/PLATELET
BASO%: 0.8 % (ref 0.0–2.0)
BASOS ABS: 0 10*3/uL (ref 0.0–0.1)
EOS ABS: 0.2 10*3/uL (ref 0.0–0.5)
EOS%: 3.9 % (ref 0.0–7.0)
HCT: 43.5 % (ref 34.8–46.6)
HGB: 14 g/dL (ref 11.6–15.9)
LYMPH%: 19.4 % (ref 14.0–49.7)
MCH: 29.9 pg (ref 25.1–34.0)
MCHC: 32.2 g/dL (ref 31.5–36.0)
MCV: 92.9 fL (ref 79.5–101.0)
MONO#: 0.5 10*3/uL (ref 0.1–0.9)
MONO%: 10.5 % (ref 0.0–14.0)
NEUT%: 65.4 % (ref 38.4–76.8)
NEUTROS ABS: 3.2 10*3/uL (ref 1.5–6.5)
PLATELETS: 146 10*3/uL (ref 145–400)
RBC: 4.68 10*6/uL (ref 3.70–5.45)
RDW: 14.2 % (ref 11.2–14.5)
WBC: 4.9 10*3/uL (ref 3.9–10.3)
lymph#: 0.9 10*3/uL (ref 0.9–3.3)
nRBC: 0 % (ref 0–0)

## 2014-02-03 MED ORDER — TRASTUZUMAB CHEMO INJECTION 440 MG
6.0000 mg/kg | Freq: Once | INTRAVENOUS | Status: AC
  Administered 2014-02-03: 294 mg via INTRAVENOUS
  Filled 2014-02-03: qty 14

## 2014-02-03 MED ORDER — ACETAMINOPHEN 325 MG PO TABS
ORAL_TABLET | ORAL | Status: AC
  Filled 2014-02-03: qty 2

## 2014-02-03 MED ORDER — ACETAMINOPHEN 325 MG PO TABS
650.0000 mg | ORAL_TABLET | Freq: Once | ORAL | Status: AC
  Administered 2014-02-03: 650 mg via ORAL

## 2014-02-03 MED ORDER — SODIUM CHLORIDE 0.9 % IV SOLN
Freq: Once | INTRAVENOUS | Status: AC
  Administered 2014-02-03: 13:00:00 via INTRAVENOUS

## 2014-02-03 NOTE — Patient Instructions (Signed)
[facility] Discharge Instructions for Patients Receiving Chemotherapy  Today you received the following chemotherapy agents Herceptin.  To help prevent nausea and vomiting after your treatment, we encourage you to take your nausea medication as directed.   If you develop nausea and vomiting that is not controlled by your nausea medication, call the clinic.   BELOW ARE SYMPTOMS THAT SHOULD BE REPORTED IMMEDIATELY:  *FEVER GREATER THAN 100.5 F  *CHILLS WITH OR WITHOUT FEVER  NAUSEA AND VOMITING THAT IS NOT CONTROLLED WITH YOUR NAUSEA MEDICATION  *UNUSUAL SHORTNESS OF BREATH  *UNUSUAL BRUISING OR BLEEDING  TENDERNESS IN MOUTH AND THROAT WITH OR WITHOUT PRESENCE OF ULCERS  *URINARY PROBLEMS  *BOWEL PROBLEMS  UNUSUAL RASH Items with * indicate a potential emergency and should be followed up as soon as possible.  Feel free to call the clinic you have any questions or concerns. The clinic phone number is [phone].  

## 2014-02-03 NOTE — Telephone Encounter (Signed)
Per pharmacy I have scheduled injection appt 

## 2014-02-03 NOTE — Progress Notes (Signed)
[facility]  Telephone:([phone] Fax:([phone]) [phone]  OFFICE PROGRESS NOTE   ID: [name]   DOB: 1914/04/10  MR#: [id number]  EXP#:[id number]  Cc:  [name], [name]  HISTORY OF PRESENT ILLNESS: From the prior summary:  [name]'s gynecologist, Dr. Conley [name], palpated a mass in the patient's left breast in 2004. The patient has a daughter, who is a Engineer, civil (consulting), formerly I believe an oncology nurse, currently a pulmonary nurse in [place], so the patient went there for her care, and while I do not have all the workup, I do have the pathology report from October 01, 2002, which was her left lumpectomy and sentinel lymph node sampling under Dr. Rexene [name]. This showed [phone]) a 2.5 cm infiltrating ductal carcinoma with lobular features (the cells were E-cadherin positive), involving one of three sentinel lymph nodes. The anterior margin was close at less than 1 mm. The patient saw Dr. Chipper [name] here, and the option of completion mastectomy versus radiotherapy alone was discussed. He felt, and I would probably have agreed, that despite the very close margin, which was nevertheless technically negative, proceeding to radiation without "clearing the margin" further would be adequate. Accordingly, the patient did receive a total of 5040 cGy with an additional 1000 cGy boost to the left breast between September 11 and February 24, 2003.   The patient then did well, but in 2007 developed some redness and bumpiness around the left areola. She brought this to Dr. Wilfred [name] attention, and apparently an initial biopsy was negative, as was a mammogram, however, with further development of the change, the patient had a biopsy under anesthesia, and this apparently was positive (I do not have those records). With this information, she proceeded to left mastectomy on February 26, 2006. The pathology there ([id number]) showed dermal involvement by adenocarcinoma, with  pagetoid spread and lymphatic space invasion. The closest margin being 1.2 cm from the inferior margin. Everything else being amply negative. There were no discrete masses or lesions identified in the left breast. The patient subsequently underwent left mastectomy.   There were a series of further skin recurrences, first in November of 2008. Initially, these were resected, but later there were too many lesions to resect, in addition to a large left axillary lymph node. This was biopsied in January 2011, showing the tumor now to be HER-2/neu positive. Patient has been on anti-HER-2/neu treatments since January 2011, initially with trastuzumab plus lapatinib. Lapatinib was discontinued in April 2011 secondary to side effects. Continues now to receive trastuzumab on a q. 3 week basis. Also receives a zoledronic acid every 3 months.    INTERVAL HISTORY: [name] returns today with her friend [name] for followup of her metastatic breast cancer. The interval history is significant for her trying to get better hearing aids. This has been a struggle for her. She is also very upset because her appointment with her primary care physician was changed and she had a great deal of difficulty getting another one. "Whydoesn't do any doctor in [place] want to see older patients?". (She does have a new appointment with [name] 03/29/2014.   REVIEW OF SYSTEMS: She tells me there have been no falls in the last month. She uses a walker and cane at home but not when she comes to see [place]. She is working hard to [employer]. to improve her hearing with a variety of application switched however will cluster about $4000. Her vision is not better. She has had no unusual  headaches denies dizziness, nausea or vomiting, denies cough or phlegm production, and has had no change in bowel or bladder habits. She has a "spot" on her mid back but she wanted me to look at today. A detailed review of systems was otherwise stable.  PAST  MEDICAL HISTORY: Past Medical History  Diagnosis Date  . Cancer     breast  . Cataract   . CAD (coronary artery disease)     cath w/ trivial disease  . Diverticulosis of colon   . Osteoporosis     s/p compression fx T12  . Macular degeneration   . Breast cancer     PAST SURGICAL HISTORY: Past Surgical History  Procedure Laterality Date  . Abdominal hysterectomy    . Breast lumpectomy    . Excision for paget's vulvar    . Vertbroplasty      T12  . Mastectomy      left  . Flexible sigmoidoscopy N/A 06/27/2012    Procedure: FLEXIBLE SIGMOIDOSCOPY;  Surgeon: [name] Dragon, MD;  Location: [facility];  Service: Endoscopy;  Laterality: N/A;    FAMILY HISTORY The patient's father died in an automobile accident at 73, the patient's mother from a stroke at 66. The patient had one brother who died from causes unknown to her at the age of 35, and a sister who had "lots of problems" and died at the age of 26, but there is no history of breast or ovarian cancer in the family.   GYNECOLOGIC HISTORY: She is GX P3. She took hormone replacement therapy briefly after menopause.   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 She has been a widow since 1997; lives independently, drives, and does all of her activities of daily living. Her daughter, [name], is a Marine scientist in [place]. Her daughter, [name], lives in [place].[name], who frequently comes with the patient to medical visits, is married to the patient's son, who is also her power of attorney. He works for the [employer]. The patient has six grandchildren and three great-grandchildren. She attends [employer].    ADVANCED DIRECTIVES: In place  HEALTH MAINTENANCE: History  Substance Use Topics  . Smoking status: Former Smoker    Quit date: 05/01/1990  . Smokeless tobacco: Never Used  . Alcohol Use: No     Colonoscopy: 02/06/2002  PAP:  Not on file  Bone density: Not on file  Lipid panel: Not on file  Allergies  Allergen Reactions  . Solifenacin Succinate     REACTION: "sick" nausea    Current Outpatient Prescriptions  Medication Sig Dispense Refill  . acetaminophen (TYLENOL) 500 MG tablet Take 1,000 mg by mouth daily.       . beta carotene w/minerals (OCUVITE) tablet Take 1 tablet by mouth daily.      Marland Kitchen BIOTIN PO Take 10,000 mg by mouth daily.      . cholecalciferol (VITAMIN D) 1000 UNITS tablet Take 1,000 Units by mouth daily.      . clobetasol cream (TEMOVATE) 9.32 % Apply 1 application topically 2 (two) times daily as needed.      Marland Kitchen losartan (COZAAR) 25 MG tablet Take 1 tablet (25 mg total) by mouth daily.  90 tablet  3  . trastuzumab (HERCEPTIN) 440 MG injection Inject into the vein once.        . triamcinolone cream (KENALOG) 0.1 % Apply 1 application topically 2 (two)  times daily.  30 g  0  . zolendronic acid (ZOMETA) 4 MG/5ML injection Inject 4 mg into the vein every 3 (three) months.         No current facility-administered medications for this visit.    OBJECTIVE: Elderly white woman who appears stated age 29 Vitals:   02/03/14 1123  BP: 165/64  Pulse: 64  Temp: 97.8 F (36.6 C)  Resp: 16     Body mass index is 19.74 kg/(m^2).    ECOG FS: 1  Sclerae unicteric, EOMs intact Oropharynx clear, no thrush or other lesions No cervical or supraclavicular adenopathy Lungs no rales or rhonchi Heart regular rate and rhythm Abd soft, nontender, positive bowel sounds MSK kyphosis and scoliosis but no focal spinal tenderness, no upper extremity lymphedema Neuro: nonfocal, well oriented, feisty affect Breasts: The right breast is unremarkable. The left breast is status post mastectomy and radiation. There are some: telangiectasias from the treatments. There is no evidence of further chest wall recurrence. Left axilla is benign Skin: That "spot" on her back is a large seborrheic keratosis. It appears  entirely benign.   LAB RESULTS: Lab Results  Component Value Date   WBC 4.9 02/03/2014   NEUTROABS 3.2 02/03/2014   HGB 14.0 02/03/2014   HCT 43.5 02/03/2014   MCV 92.9 02/03/2014   PLT 146 02/03/2014      Chemistry      Component Value Date/Time   NA 141 01/06/2014 1259   NA 141 12/09/2013 1245   K 4.4 01/06/2014 1259   K 4.1 12/09/2013 1245   CL 104 12/09/2013 1245   CL 106 10/15/2012 1136   CO2 23 01/06/2014 1259   CO2 26 12/09/2013 1245   BUN 15.5 01/06/2014 1259   BUN 16 12/09/2013 1245   CREATININE 0.9 01/06/2014 1259   CREATININE 0.84 12/09/2013 1245      Component Value Date/Time   CALCIUM 9.8 01/06/2014 1259   CALCIUM 10.3 12/09/2013 1245   ALKPHOS 62 01/06/2014 1259   ALKPHOS 61 12/09/2013 1245   AST 27 01/06/2014 1259   AST 27 12/09/2013 1245   ALT 11 01/06/2014 1259   ALT 12 12/09/2013 1245   BILITOT 0.44 01/06/2014 1259   BILITOT 0.3 12/09/2013 1245       Lab Results  Component Value Date   LABCA2 17 09/26/2011     STUDIES: Study Result   ------------------------------------------------------------------- Transthoracic Echocardiography  Patient: Ottis, Sarnowski MR #: [id number] Study Date: 01/05/2014 Gender: F Age: 41 Height: 154.9 cm Weight: 48.1 kg BSA: 1.44 m^2 Pt. Status: Room:  ATTENDING Default, Provider [phone] REFERRING Rowe Clack SONOGRAPHER Delman Kitten, RCS ORDERING [name], [name] REFERRING [name], [name] PERFORMING [facility], Outpatient  cc:  ------------------------------------------------------------------- LV EF: 55%       ASSESSMENT: Ms. Snuffer is a 80 y.o.  [place], [place] woman:  (1) Status post left lumpectomy and sentinel lymph node dissection in 09/2002 for a T2 N1 (Stage II) invasive ductal carcinoma, triple negative, status post radiation therapy completed in 01/2003.  (2) Local recurrence around the areola 01/2006, leading to mastectomy.  (3) A series of further skin recurrences first noted in 03/2007.  Initially these were resected, later with too many lesions to resect; there was also a large left axillary lymph node which was biopsied in 04/2009 showing the tumor now to be HER-2 positive.   (4) Since 04/2009 she has been on anti-HER-2 treatment, initially with trastuzumab plus lapatinib, with a lapatinib discontinued 07/2009 because of side  effects.   (5) Recurrence of vulvar Paget's disease, originally diagnosed and resected in 2004 and recurred in 02/2012.  Followed by Dr. Paula Compton and Dr. Marti Sleigh.  (6) Continues to receive trastuzumab every 3 weeks and zoledronic acid every 3 months. Most recent echo 12/08/2012 showed an ejection fraction in the 50-55% range.  (7) Nocturnal leg cramping  PLAN: [name] is doing well as far as her recurrent breast cancer is concerned. There is no evidence of disease activity at present. She is tolerating the trastuzumab with no side effects that she is aware of. The port is working well. We reviewed her lab work and echocardiogram which are also favorable.  Unfortunately I don't have a solution for her experience as a Medicare patient trying to find a doctor. She does have an appointment with Dr. Basilio Cairo late November and she will discuss those issues again at that visit.  Otherwise our plan is to continue trastuzumab until there is evidence of disease progression or significant toxicity. I have entered her treatments through January and she will see me late January, after her next birthday. She knows to call for any problems that may develop before her next visit here.  Chauncey Cruel, MD  02/03/2014 11:34 AM

## 2014-02-04 ENCOUNTER — Other Ambulatory Visit: Payer: Self-pay | Admitting: Oncology

## 2014-02-04 ENCOUNTER — Telehealth: Payer: Self-pay | Admitting: Internal Medicine

## 2014-02-04 ENCOUNTER — Telehealth: Payer: Self-pay | Admitting: *Deleted

## 2014-02-04 NOTE — Telephone Encounter (Signed)
Per staff messages and POF I have scheduled appts. Advised scheduler of appts. JMW  

## 2014-02-04 NOTE — Progress Notes (Signed)
CORRECTION: she is receiving trastuzumab every FOUR weeks, not every 21 days.

## 2014-02-17 ENCOUNTER — Encounter: Payer: Self-pay | Admitting: Internal Medicine

## 2014-03-03 ENCOUNTER — Ambulatory Visit (HOSPITAL_BASED_OUTPATIENT_CLINIC_OR_DEPARTMENT_OTHER): Payer: Medicare Other

## 2014-03-03 DIAGNOSIS — C773 Secondary and unspecified malignant neoplasm of axilla and upper limb lymph nodes: Secondary | ICD-10-CM

## 2014-03-03 DIAGNOSIS — C50919 Malignant neoplasm of unspecified site of unspecified female breast: Secondary | ICD-10-CM

## 2014-03-03 DIAGNOSIS — C50912 Malignant neoplasm of unspecified site of left female breast: Secondary | ICD-10-CM

## 2014-03-03 DIAGNOSIS — M810 Age-related osteoporosis without current pathological fracture: Secondary | ICD-10-CM

## 2014-03-03 DIAGNOSIS — Z5112 Encounter for antineoplastic immunotherapy: Secondary | ICD-10-CM

## 2014-03-03 LAB — COMPREHENSIVE METABOLIC PANEL (CC13)
ALBUMIN: 4.2 g/dL (ref 3.5–5.0)
ALK PHOS: 69 U/L (ref 40–150)
ALT: 15 U/L (ref 0–55)
ANION GAP: 9 meq/L (ref 3–11)
AST: 29 U/L (ref 5–34)
BUN: 12.4 mg/dL (ref 7.0–26.0)
CO2: 24 meq/L (ref 22–29)
Calcium: 10.3 mg/dL (ref 8.4–10.4)
Chloride: 107 mEq/L (ref 98–109)
Creatinine: 0.9 mg/dL (ref 0.6–1.1)
Glucose: 89 mg/dl (ref 70–140)
Potassium: 4.1 mEq/L (ref 3.5–5.1)
Sodium: 141 mEq/L (ref 136–145)
Total Bilirubin: 0.48 mg/dL (ref 0.20–1.20)
Total Protein: 7.3 g/dL (ref 6.4–8.3)

## 2014-03-03 LAB — CBC WITH DIFFERENTIAL/PLATELET
BASO%: 0.9 % (ref 0.0–2.0)
BASOS ABS: 0 10*3/uL (ref 0.0–0.1)
EOS ABS: 0.2 10*3/uL (ref 0.0–0.5)
EOS%: 3.9 % (ref 0.0–7.0)
HCT: 44.7 % (ref 34.8–46.6)
HEMOGLOBIN: 14.1 g/dL (ref 11.6–15.9)
LYMPH%: 18.1 % (ref 14.0–49.7)
MCH: 29.1 pg (ref 25.1–34.0)
MCHC: 31.6 g/dL (ref 31.5–36.0)
MCV: 92.1 fL (ref 79.5–101.0)
MONO#: 0.5 10*3/uL (ref 0.1–0.9)
MONO%: 8.7 % (ref 0.0–14.0)
NEUT%: 68.4 % (ref 38.4–76.8)
NEUTROS ABS: 3.6 10*3/uL (ref 1.5–6.5)
PLATELETS: 165 10*3/uL (ref 145–400)
RBC: 4.85 10*6/uL (ref 3.70–5.45)
RDW: 14.5 % (ref 11.2–14.5)
WBC: 5.3 10*3/uL (ref 3.9–10.3)
lymph#: 1 10*3/uL (ref 0.9–3.3)

## 2014-03-03 MED ORDER — HEPARIN SOD (PORK) LOCK FLUSH 100 UNIT/ML IV SOLN
500.0000 [IU] | Freq: Once | INTRAVENOUS | Status: DC | PRN
  Filled 2014-03-03: qty 5

## 2014-03-03 MED ORDER — ACETAMINOPHEN 325 MG PO TABS
ORAL_TABLET | ORAL | Status: AC
Start: 2014-03-03 — End: 2014-03-03
  Filled 2014-03-03: qty 2

## 2014-03-03 MED ORDER — ZOLEDRONIC ACID 4 MG/5ML IV CONC
3.0000 mg | Freq: Once | INTRAVENOUS | Status: AC
  Administered 2014-03-03: 3 mg via INTRAVENOUS
  Filled 2014-03-03: qty 3.75

## 2014-03-03 MED ORDER — TRASTUZUMAB CHEMO INJECTION 440 MG
6.0000 mg/kg | Freq: Once | INTRAVENOUS | Status: AC
  Administered 2014-03-03: 294 mg via INTRAVENOUS
  Filled 2014-03-03: qty 14

## 2014-03-03 MED ORDER — SODIUM CHLORIDE 0.9 % IV SOLN
Freq: Once | INTRAVENOUS | Status: AC
  Administered 2014-03-03: 14:00:00 via INTRAVENOUS

## 2014-03-03 MED ORDER — ACETAMINOPHEN 325 MG PO TABS
650.0000 mg | ORAL_TABLET | Freq: Once | ORAL | Status: AC
  Administered 2014-03-03: 650 mg via ORAL

## 2014-03-03 NOTE — Patient Instructions (Signed)
[facility] Discharge Instructions for Patients Receiving Chemotherapy  Today you received the following chemotherapy agents: Herceptin.  To help prevent nausea and vomiting after your treatment, we encourage you to take your nausea medication: as directed.   If you develop nausea and vomiting that is not controlled by your nausea medication, call the clinic.   BELOW ARE SYMPTOMS THAT SHOULD BE REPORTED IMMEDIATELY:  *FEVER GREATER THAN 100.5 F  *CHILLS WITH OR WITHOUT FEVER  NAUSEA AND VOMITING THAT IS NOT CONTROLLED WITH YOUR NAUSEA MEDICATION  *UNUSUAL SHORTNESS OF BREATH  *UNUSUAL BRUISING OR BLEEDING  TENDERNESS IN MOUTH AND THROAT WITH OR WITHOUT PRESENCE OF ULCERS  *URINARY PROBLEMS  *BOWEL PROBLEMS  UNUSUAL RASH Items with * indicate a potential emergency and should be followed up as soon as possible.  Feel free to call the clinic you have any questions or concerns. The clinic phone number is [phone].   Zoledronic Acid injection (Hypercalcemia, Oncology) What is this medicine? ZOLEDRONIC ACID (ZOE le dron ik AS id) lowers the amount of calcium loss from bone. It is used to treat too much calcium in your blood from cancer. It is also used to prevent complications of cancer that has spread to the bone. This medicine may be used for other purposes; ask your health care provider or pharmacist if you have questions. COMMON BRAND NAME(S): Zometa What should I tell my health care provider before I take this medicine? They need to know if you have any of these conditions: -aspirin-sensitive asthma -cancer, especially if you are receiving medicines used to treat cancer -dental disease or wear dentures -infection -kidney disease -receiving corticosteroids like dexamethasone or prednisone -an unusual or allergic reaction to zoledronic acid, other medicines, foods, dyes, or preservatives -pregnant or trying to get pregnant -breast-feeding How should I  use this medicine? This medicine is for infusion into a vein. It is given by a health care professional in a hospital or clinic setting. Talk to your pediatrician regarding the use of this medicine in children. Special care may be needed. Overdosage: If you think you have taken too much of this medicine contact a poison control center or emergency room at once. NOTE: This medicine is only for you. Do not share this medicine with others. What if I miss a dose? It is important not to miss your dose. Call your doctor or health care professional if you are unable to keep an appointment. What may interact with this medicine? -certain antibiotics given by injection -NSAIDs, medicines for pain and inflammation, like ibuprofen or naproxen -some diuretics like bumetanide, furosemide -teriparatide -thalidomide This list may not describe all possible interactions. Give your health care provider a list of all the medicines, herbs, non-prescription drugs, or dietary supplements you use. Also tell them if you smoke, drink alcohol, or use illegal drugs. Some items may interact with your medicine. What should I watch for while using this medicine? Visit your doctor or health care professional for regular checkups. It may be some time before you see the benefit from this medicine. Do not stop taking your medicine unless your doctor tells you to. Your doctor may order blood tests or other tests to see how you are doing. Women should inform their doctor if they wish to become pregnant or think they might be pregnant. There is a potential for serious side effects to an unborn child. Talk to your health care professional or pharmacist for more information. You should make sure that you get  enough calcium and vitamin D while you are taking this medicine. Discuss the foods you eat and the vitamins you take with your health care professional. Some people who take this medicine have severe bone, joint, and/or muscle pain.  This medicine may also increase your risk for jaw problems or a broken thigh bone. Tell your doctor right away if you have severe pain in your jaw, bones, joints, or muscles. Tell your doctor if you have any pain that does not go away or that gets worse. Tell your dentist and dental surgeon that you are taking this medicine. You should not have major dental surgery while on this medicine. See your dentist to have a dental exam and fix any dental problems before starting this medicine. Take good care of your teeth while on this medicine. Make sure you see your dentist for regular follow-up appointments. What side effects may I notice from receiving this medicine? Side effects that you should report to your doctor or health care professional as soon as possible: -allergic reactions like skin rash, itching or hives, swelling of the face, lips, or tongue -anxiety, confusion, or depression -breathing problems -changes in vision -eye pain -feeling faint or lightheaded, falls -jaw pain, especially after dental work -mouth sores -muscle cramps, stiffness, or weakness -trouble passing urine or change in the amount of urine Side effects that usually do not require medical attention (report to your doctor or health care professional if they continue or are bothersome): -bone, joint, or muscle pain -constipation -diarrhea -fever -hair loss -irritation at site where injected -loss of appetite -nausea, vomiting -stomach upset -trouble sleeping -trouble swallowing -weak or tired This list may not describe all possible side effects. Call your doctor for medical advice about side effects. You may report side effects to FDA at 1-800-FDA-1088. Where should I keep my medicine? This drug is given in a hospital or clinic and will not be stored at home. NOTE: This sheet is a summary. It may not cover all possible information. If you have questions about this medicine, talk to your doctor, pharmacist, or  health care provider.  2015, [employer]. (2012-09-25 13:03:13)

## 2014-03-23 ENCOUNTER — Telehealth: Payer: Self-pay | Admitting: Oncology

## 2014-03-23 ENCOUNTER — Other Ambulatory Visit: Payer: Medicare Other

## 2014-03-23 NOTE — Telephone Encounter (Signed)
Gave pt/son two updated cal w/ d/t per careplan.

## 2014-03-29 ENCOUNTER — Ambulatory Visit (INDEPENDENT_AMBULATORY_CARE_PROVIDER_SITE_OTHER): Payer: Medicare Other | Admitting: Internal Medicine

## 2014-03-29 ENCOUNTER — Encounter: Payer: Self-pay | Admitting: Internal Medicine

## 2014-03-29 ENCOUNTER — Telehealth: Payer: Self-pay | Admitting: Oncology

## 2014-03-29 VITALS — BP 110/68 | HR 75 | Ht 61.0 in | Wt 103.0 lb

## 2014-03-29 DIAGNOSIS — I2510 Atherosclerotic heart disease of native coronary artery without angina pectoris: Secondary | ICD-10-CM

## 2014-03-29 DIAGNOSIS — R05 Cough: Secondary | ICD-10-CM

## 2014-03-29 DIAGNOSIS — R059 Cough, unspecified: Secondary | ICD-10-CM

## 2014-03-29 NOTE — Progress Notes (Signed)
Pre visit review using our clinic review tool, if applicable. No additional management support is needed unless otherwise documented below in the visit note. 

## 2014-03-29 NOTE — Patient Instructions (Addendum)
It was good to see you today.  We have reviewed your prior records including labs and tests today  There is no evidence for pneumonia on your exam - continue your cough medication and tylenol (if needed) - please call if worsening symptoms or unimproved  Please reschedule your chemo therapy until you are feeling better!  Ok for flu shot and Prevnar 13 immunizations (safe with chemo), BUT WAIT until you are feeling improved (no cough x 72h)  Other medications reviewed and updated, no changes recommended at this time.  Please schedule followup in 6 months, call sooner if problems.   Cough, Adult  A cough is a reflex. It helps you clear your throat and airways. A cough can help heal your body. A cough can last 2 or 3 weeks (acute) or may last more than 8 weeks (chronic). Some common causes of a cough can include an infection, allergy, or a cold. HOME CARE  Only take medicine as told by your doctor.  If given, take your medicines (antibiotics) as told. Finish them even if you start to feel better.  Use a cold steam vaporizer or humidifier in your home. This can help loosen thick spit (secretions).  Sleep so you are almost sitting up (semi-upright). Use pillows to do this. This helps reduce coughing.  Rest as needed.  Stop smoking if you smoke. GET HELP RIGHT AWAY IF:  You have yellowish-white fluid (pus) in your thick spit.  Your cough gets worse.  Your medicine does not reduce coughing, and you are losing sleep.  You cough up blood.  You have trouble breathing.  Your pain gets worse and medicine does not help.  You have a fever. MAKE SURE YOU:   Understand these instructions.  Will watch your condition.  Will get help right away if you are not doing well or get worse. Document Released: 12/28/2010 Document Revised: 08/31/2013 Document Reviewed: 12/28/2010 [facility] Patient Information 2015 [place], [place]. This information is not intended to replace advice given to  you by your health care provider. Make sure you discuss any questions you have with your health care provider. Cough, Adult  A cough is a reflex. It helps you clear your throat and airways. A cough can help heal your body. A cough can last 2 or 3 weeks (acute) or may last more than 8 weeks (chronic). Some common causes of a cough can include an infection, allergy, or a cold. HOME CARE  Only take medicine as told by your doctor.  If given, take your medicines (antibiotics) as told. Finish them even if you start to feel better.  Use a cold steam vaporizer or humidifier in your home. This can help loosen thick spit (secretions).  Sleep so you are almost sitting up (semi-upright). Use pillows to do this. This helps reduce coughing.  Rest as needed.  Stop smoking if you smoke. GET HELP RIGHT AWAY IF:  You have yellowish-white fluid (pus) in your thick spit.  Your cough gets worse.  Your medicine does not reduce coughing, and you are losing sleep.  You cough up blood.  You have trouble breathing.  Your pain gets worse and medicine does not help.  You have a fever. MAKE SURE YOU:   Understand these instructions.  Will watch your condition.  Will get help right away if you are not doing well or get worse. Document Released: 12/28/2010 Document Revised: 08/31/2013 Document Reviewed: 12/28/2010 [facility] Patient Information 2015 [place], [place]. This information is not intended to replace  advice given to you by your health care provider. Make sure you discuss any questions you have with your health care provider.  

## 2014-03-29 NOTE — Progress Notes (Signed)
   Subjective:    Patient ID: [name], female    DOB: October 07, 1913, 80 y.o.   MRN: [id number]  HPI  Patient is here for follow up  Reviewed chronic medical issues and interval medical events  Past Medical History  Diagnosis Date  . Cancer     breast  . Cataract   . CAD (coronary artery disease)     cath w/ trivial disease  . Diverticulosis of colon   . Osteoporosis     s/p compression fx T12  . Macular degeneration   . Breast cancer     Review of Systems  Constitutional: Positive for fatigue (x 5 days, but improved in last 24h). Negative for fever, activity change and unexpected weight change.  HENT: Positive for congestion (improved in last 24h).   Respiratory: Positive for cough. Negative for shortness of breath and wheezing.   Cardiovascular: Negative for chest pain, palpitations and leg swelling.       Objective:   Physical Exam  BP 110/68 mmHg  Pulse 75  Ht 5\' 1"  (1.549 m)  Wt 103 lb (46.72 kg)  BMI 19.47 kg/m2  SpO2 92% Wt Readings from Last 3 Encounters:  03/29/14 103 lb (46.72 kg)  02/03/14 104 lb 6.4 oz (47.356 kg)  01/05/14 104 lb 8 oz (47.401 kg)   Constitutional: She appears well-developed and well-nourished. No distress. Aide at side Neck: Normal range of motion. Neck supple. No JVD present. No thyromegaly present.  Cardiovascular: Normal rate, regular rhythm and normal heart sounds.  No murmur heard. No BLE edema. Pulmonary/Chest: Effort normal and breath sounds normal. No respiratory distress. She has no wheezes.  Psychiatric: She has a normal mood and affect. Her behavior is normal. Judgment and thought content normal.   Lab Results  Component Value Date   WBC 5.3 03/03/2014   HGB 14.1 03/03/2014   HCT 44.7 03/03/2014   PLT 165 03/03/2014   GLUCOSE 89 03/03/2014   ALT 15 03/03/2014   AST 29 03/03/2014   NA 141 03/03/2014   K 4.1 03/03/2014   CL 104 12/09/2013   CREATININE 0.9 03/03/2014   BUN 12.4 03/03/2014   CO2 24 03/03/2014     TSH 1.082 01/25/2010    No results found.     Assessment & Plan:   Cough - Afeb, lungs clear and no sputum - improved in past 24h by pt report - continue over-the-counter symptomatically care with antitussives and Tylenol. Patient to call symptoms worse or unimproved for administration of antibiotics if needed. Agree with delay of next chemotherapy as scheduled this week until symptoms improved. Likewise okay for an active flu and Prevnar 13, but encouraged to wait until symptoms improved including no cough for at least 72 hours

## 2014-03-29 NOTE — Telephone Encounter (Signed)
returned pt son call and confirmed that he wanted to cx due to pt sick...Marland Kitchenper Val ok to cx.

## 2014-03-31 ENCOUNTER — Other Ambulatory Visit: Payer: Medicare Other

## 2014-03-31 ENCOUNTER — Ambulatory Visit: Payer: Medicare Other

## 2014-04-14 ENCOUNTER — Other Ambulatory Visit: Payer: Medicare Other

## 2014-04-14 ENCOUNTER — Other Ambulatory Visit: Payer: Self-pay | Admitting: Nurse Practitioner

## 2014-04-14 ENCOUNTER — Ambulatory Visit (INDEPENDENT_AMBULATORY_CARE_PROVIDER_SITE_OTHER): Payer: Medicare Other | Admitting: *Deleted

## 2014-04-14 ENCOUNTER — Ambulatory Visit: Payer: Medicare Other

## 2014-04-14 DIAGNOSIS — Z23 Encounter for immunization: Secondary | ICD-10-CM

## 2014-04-19 ENCOUNTER — Telehealth: Payer: Self-pay | Admitting: *Deleted

## 2014-04-19 DIAGNOSIS — C519 Malignant neoplasm of vulva, unspecified: Secondary | ICD-10-CM

## 2014-04-19 DIAGNOSIS — C4499 Other specified malignant neoplasm of skin, unspecified: Secondary | ICD-10-CM

## 2014-04-19 MED ORDER — CLOBETASOL PROPIONATE 0.05 % EX CREA: 1.0000 "application " | TOPICAL_CREAM | Freq: Two times a day (BID) | CUTANEOUS | Status: DC | PRN

## 2014-04-19 NOTE — Telephone Encounter (Signed)
Received refill request for clobetasol cream from pt's pharmacy. Per Dr. Mora Bellman last note pt needs appt in March 2016. Called pt with appt for 07/09/14 at 10:45am and sent refill for cream. Pt wrote down appt date and time and is agreeable to pick up cream tomorrow at her pharmacy. Told pt to please call us back with any further questions or concerns. Pt agreeable to this.

## 2014-04-27 ENCOUNTER — Telehealth: Payer: Self-pay | Admitting: Oncology

## 2014-04-27 NOTE — Telephone Encounter (Signed)
S/w pt advised appt time chg on 1/27 from 11.30 to 1.15. Pt verbalized understanding.

## 2014-04-28 ENCOUNTER — Ambulatory Visit (HOSPITAL_BASED_OUTPATIENT_CLINIC_OR_DEPARTMENT_OTHER): Payer: Medicare Other

## 2014-04-28 ENCOUNTER — Other Ambulatory Visit (HOSPITAL_BASED_OUTPATIENT_CLINIC_OR_DEPARTMENT_OTHER): Payer: Medicare Other

## 2014-04-28 ENCOUNTER — Other Ambulatory Visit: Payer: Self-pay | Admitting: *Deleted

## 2014-04-28 DIAGNOSIS — C773 Secondary and unspecified malignant neoplasm of axilla and upper limb lymph nodes: Secondary | ICD-10-CM

## 2014-04-28 DIAGNOSIS — C50912 Malignant neoplasm of unspecified site of left female breast: Secondary | ICD-10-CM

## 2014-04-28 DIAGNOSIS — Z5112 Encounter for antineoplastic immunotherapy: Secondary | ICD-10-CM

## 2014-04-28 LAB — CBC WITH DIFFERENTIAL/PLATELET
BASO%: 1 % (ref 0.0–2.0)
BASOS ABS: 0 10*3/uL (ref 0.0–0.1)
EOS%: 5.3 % (ref 0.0–7.0)
Eosinophils Absolute: 0.2 10*3/uL (ref 0.0–0.5)
HCT: 45.1 % (ref 34.8–46.6)
HEMOGLOBIN: 14.3 g/dL (ref 11.6–15.9)
LYMPH%: 20.6 % (ref 14.0–49.7)
MCH: 29.7 pg (ref 25.1–34.0)
MCHC: 31.7 g/dL (ref 31.5–36.0)
MCV: 93.8 fL (ref 79.5–101.0)
MONO#: 0.5 10*3/uL (ref 0.1–0.9)
MONO%: 12.2 % (ref 0.0–14.0)
NEUT#: 2.5 10*3/uL (ref 1.5–6.5)
NEUT%: 60.9 % (ref 38.4–76.8)
PLATELETS: 126 10*3/uL — AB (ref 145–400)
RBC: 4.81 10*6/uL (ref 3.70–5.45)
RDW: 14.6 % — AB (ref 11.2–14.5)
WBC: 4.2 10*3/uL (ref 3.9–10.3)
lymph#: 0.9 10*3/uL (ref 0.9–3.3)
nRBC: 0 % (ref 0–0)

## 2014-04-28 LAB — COMPREHENSIVE METABOLIC PANEL (CC13)
ALK PHOS: 59 U/L (ref 40–150)
ALT: 15 U/L (ref 0–55)
AST: 29 U/L (ref 5–34)
Albumin: 4.1 g/dL (ref 3.5–5.0)
Anion Gap: 9 mEq/L (ref 3–11)
BILIRUBIN TOTAL: 0.65 mg/dL (ref 0.20–1.20)
BUN: 13.3 mg/dL (ref 7.0–26.0)
CO2: 26 mEq/L (ref 22–29)
Calcium: 10.3 mg/dL (ref 8.4–10.4)
Chloride: 107 mEq/L (ref 98–109)
Creatinine: 0.9 mg/dL (ref 0.6–1.1)
EGFR: 57 mL/min/{1.73_m2} — AB (ref >90)
GLUCOSE: 89 mg/dL (ref 70–140)
Potassium: 4.3 mEq/L (ref 3.5–5.1)
SODIUM: 142 meq/L (ref 136–145)
TOTAL PROTEIN: 7.1 g/dL (ref 6.4–8.3)

## 2014-04-28 MED ORDER — ACETAMINOPHEN 325 MG PO TABS
ORAL_TABLET | ORAL | Status: AC
  Filled 2014-04-28: qty 2

## 2014-04-28 MED ORDER — SODIUM CHLORIDE 0.9 % IV SOLN
Freq: Once | INTRAVENOUS | Status: AC
  Administered 2014-04-28: 10:00:00 via INTRAVENOUS

## 2014-04-28 MED ORDER — ACETAMINOPHEN 325 MG PO TABS
650.0000 mg | ORAL_TABLET | Freq: Once | ORAL | Status: AC
  Administered 2014-04-28: 650 mg via ORAL

## 2014-04-28 MED ORDER — TRASTUZUMAB CHEMO INJECTION 440 MG
6.0000 mg/kg | Freq: Once | INTRAVENOUS | Status: AC
  Administered 2014-04-28: 294 mg via INTRAVENOUS
  Filled 2014-04-28: qty 14

## 2014-04-28 NOTE — Patient Instructions (Signed)
[facility] Discharge Instructions for Patients Receiving Chemotherapy  Today you received the following chemotherapy agents Herceptin  To help prevent nausea and vomiting after your treatment, we encourage you to take your nausea medication     If you develop nausea and vomiting that is not controlled by your nausea medication, call the clinic.   BELOW ARE SYMPTOMS THAT SHOULD BE REPORTED IMMEDIATELY:  *FEVER GREATER THAN 100.5 F  *CHILLS WITH OR WITHOUT FEVER  NAUSEA AND VOMITING THAT IS NOT CONTROLLED WITH YOUR NAUSEA MEDICATION  *UNUSUAL SHORTNESS OF BREATH  *UNUSUAL BRUISING OR BLEEDING  TENDERNESS IN MOUTH AND THROAT WITH OR WITHOUT PRESENCE OF ULCERS  *URINARY PROBLEMS  *BOWEL PROBLEMS  UNUSUAL RASH Items with * indicate a potential emergency and should be followed up as soon as possible.  Feel free to call the clinic you have any questions or concerns. The clinic phone number is [phone].    

## 2014-04-28 NOTE — Telephone Encounter (Signed)
error 

## 2014-05-04 ENCOUNTER — Ambulatory Visit (INDEPENDENT_AMBULATORY_CARE_PROVIDER_SITE_OTHER): Payer: Medicare Other | Admitting: *Deleted

## 2014-05-04 DIAGNOSIS — Z23 Encounter for immunization: Secondary | ICD-10-CM

## 2014-05-05 ENCOUNTER — Encounter: Payer: Self-pay | Admitting: Family

## 2014-05-05 ENCOUNTER — Other Ambulatory Visit: Payer: Medicare Other

## 2014-05-05 ENCOUNTER — Ambulatory Visit (INDEPENDENT_AMBULATORY_CARE_PROVIDER_SITE_OTHER): Payer: Medicare Other | Admitting: Family

## 2014-05-05 VITALS — BP 144/82 | HR 74 | Temp 97.5°F | Resp 16 | Ht 60.0 in | Wt 104.4 lb

## 2014-05-05 DIAGNOSIS — L02429 Furuncle of limb, unspecified: Secondary | ICD-10-CM

## 2014-05-05 MED ORDER — SULFAMETHOXAZOLE-TRIMETHOPRIM 800-160 MG PO TABS: 1.0000 | ORAL_TABLET | Freq: Two times a day (BID) | ORAL | Status: DC

## 2014-05-05 NOTE — Progress Notes (Signed)
   Subjective:    Patient ID: Sandrea Hughs, female    DOB: Oct 02, 1913, 81 y.o.   MRN: [id number]  Chief Complaint  Patient presents with  . Cyst    has cyst on left arm, x1 month     HPI:  LILOU KNEIP is a 81 y.o. female who presents today for an acute visit.   Indicates she has a cyst on the posterior aspect of her of her left forearm that is red but does not hurt or itch. She has been applying warm compresses to the area as well as liquid bandage which have made little difference. Denies any discharge from site.   Allergies  Allergen Reactions  . Solifenacin Succinate     REACTION: "sick" nausea    Current Outpatient Prescriptions on File Prior to Visit  Medication Sig Dispense Refill  . acetaminophen (TYLENOL) 500 MG tablet Take 1,000 mg by mouth daily.     . beta carotene w/minerals (OCUVITE) tablet Take 1 tablet by mouth daily.    Marland Kitchen BIOTIN PO Take 10,000 mg by mouth daily.    . cholecalciferol (VITAMIN D) 1000 UNITS tablet Take 1,000 Units by mouth daily.    . clobetasol cream (TEMOVATE) 3.33 % Apply 1 application topically 2 (two) times daily as needed. 30 g 1  . losartan (COZAAR) 25 MG tablet Take 1 tablet (25 mg total) by mouth daily. 90 tablet 3  . trastuzumab (HERCEPTIN) 440 MG injection Inject into the vein once.      . triamcinolone cream (KENALOG) 0.1 % Apply 1 application topically 2 (two) times daily. 30 g 0  . zolendronic acid (ZOMETA) 4 MG/5ML injection Inject 4 mg into the vein every 3 (three) months.       No current facility-administered medications on file prior to visit.    Review of Systems  Constitutional: Negative for fever and chills.  Skin: Positive for rash (As described above).      Objective:    BP 144/82 mmHg  Pulse 74  Temp(Src) 97.5 F (36.4 C) (Oral)  Resp 16  Ht 5' (1.524 m)  Wt 104 lb 6.4 oz (47.356 kg)  BMI 20.39 kg/m2  SpO2 95% Nursing note and vital signs reviewed.  Physical Exam  Constitutional: She is oriented  to person, place, and time. She appears well-developed and well-nourished. No distress.  Cardiovascular: Normal rate, regular rhythm, normal heart sounds and intact distal pulses.   Pulmonary/Chest: Effort normal and breath sounds normal.  Neurological: She is alert and oriented to person, place, and time.  Skin: Skin is warm and dry.  Left forearm-about half-inch size boil present with no discharge, firm to touch  Psychiatric: She has a normal mood and affect. Her behavior is normal. Judgment and thought content normal.       Assessment & Plan:

## 2014-05-05 NOTE — Assessment & Plan Note (Signed)
Exam consistent with boil. Start Bactrim. Continue warm compresses. Follow up if it worsens or fails to improve with the treatment.

## 2014-05-05 NOTE — Patient Instructions (Addendum)
Thank you for choosing [facility].  Summary/Instructions:  Your prescription(s) have been submitted to your pharmacy or been printed and provided for you. Please take as directed and contact our office if you believe you are having problem(s) with the medication(s) or have any questions.  If your symptoms worsen or fail to improve, please contact our office for further instruction, or in case of emergency go directly to the emergency room at the closest medical facility.    Abscess An abscess is an infected area that contains a collection of pus and debris.It can occur in almost any part of the body. An abscess is also known as a furuncle or boil. CAUSES  An abscess occurs when tissue gets infected. This can occur from blockage of oil or sweat glands, infection of hair follicles, or a minor injury to the skin. As the body tries to fight the infection, pus collects in the area and creates pressure under the skin. This pressure causes pain. People with weakened immune systems have difficulty fighting infections and get certain abscesses more often.  SYMPTOMS Usually an abscess develops on the skin and becomes a painful mass that is red, warm, and tender. If the abscess forms under the skin, you may feel a moveable soft area under the skin. Some abscesses break open (rupture) on their own, but most will continue to get worse without care. The infection can spread deeper into the body and eventually into the bloodstream, causing you to feel ill.  DIAGNOSIS  Your caregiver will take your medical history and perform a physical exam. A sample of fluid may also be taken from the abscess to determine what is causing your infection. TREATMENT  Your caregiver may prescribe antibiotic medicines to fight the infection. However, taking antibiotics alone usually does not cure an abscess. Your caregiver may need to make a small cut (incision) in the abscess to drain the pus. In some cases, gauze is packed  into the abscess to reduce pain and to continue draining the area. HOME CARE INSTRUCTIONS   Only take over-the-counter or prescription medicines for pain, discomfort, or fever as directed by your caregiver.  If you were prescribed antibiotics, take them as directed. Finish them even if you start to feel better.  If gauze is used, follow your caregiver's directions for changing the gauze.  To avoid spreading the infection:  Keep your draining abscess covered with a bandage.  Wash your hands well.  Do not share personal care items, towels, or whirlpools with others.  Avoid skin contact with others.  Keep your skin and clothes clean around the abscess.  Keep all follow-up appointments as directed by your caregiver. SEEK MEDICAL CARE IF:   You have increased pain, swelling, redness, fluid drainage, or bleeding.  You have muscle aches, chills, or a general ill feeling.  You have a fever. MAKE SURE YOU:   Understand these instructions.  Will watch your condition.  Will get help right away if you are not doing well or get worse. Document Released: 01/24/2005 Document Revised: 10/16/2011 Document Reviewed: 06/29/2011 [facility] Patient Information 2015 [place], [place]. This information is not intended to replace advice given to you by your health care provider. Make sure you discuss any questions you have with your health care provider.

## 2014-05-18 ENCOUNTER — Ambulatory Visit (HOSPITAL_COMMUNITY)
Admission: RE | Admit: 2014-05-18 | Discharge: 2014-05-18 | Disposition: A | Discharge status: Home / Self Care | Payer: Medicare Other | Source: Ambulatory Visit | Attending: Internal Medicine | Admitting: Internal Medicine

## 2014-05-18 DIAGNOSIS — C50912 Malignant neoplasm of unspecified site of left female breast: Secondary | ICD-10-CM | POA: Insufficient documentation

## 2014-05-18 DIAGNOSIS — Z87891 Personal history of nicotine dependence: Secondary | ICD-10-CM | POA: Diagnosis not present

## 2014-05-18 DIAGNOSIS — I10 Essential (primary) hypertension: Secondary | ICD-10-CM | POA: Diagnosis not present

## 2014-05-18 DIAGNOSIS — I359 Nonrheumatic aortic valve disorder, unspecified: Secondary | ICD-10-CM

## 2014-05-18 NOTE — Progress Notes (Signed)
Echocardiogram 2D Echocardiogram has been performed.  [name] 05/18/2014, 1:44 PM

## 2014-05-26 ENCOUNTER — Ambulatory Visit: Payer: Medicare Other

## 2014-05-26 ENCOUNTER — Other Ambulatory Visit: Payer: Medicare Other

## 2014-05-26 ENCOUNTER — Encounter: Payer: Self-pay | Admitting: Nurse Practitioner

## 2014-05-26 ENCOUNTER — Ambulatory Visit (HOSPITAL_BASED_OUTPATIENT_CLINIC_OR_DEPARTMENT_OTHER): Payer: Medicare Other

## 2014-05-26 ENCOUNTER — Ambulatory Visit (HOSPITAL_BASED_OUTPATIENT_CLINIC_OR_DEPARTMENT_OTHER): Payer: Medicare Other | Admitting: Nurse Practitioner

## 2014-05-26 ENCOUNTER — Other Ambulatory Visit (HOSPITAL_BASED_OUTPATIENT_CLINIC_OR_DEPARTMENT_OTHER): Payer: Medicare Other

## 2014-05-26 ENCOUNTER — Telehealth: Payer: Self-pay | Admitting: *Deleted

## 2014-05-26 ENCOUNTER — Telehealth: Payer: Self-pay | Admitting: Nurse Practitioner

## 2014-05-26 VITALS — BP 172/80 | HR 70 | Resp 18 | Ht 60.0 in | Wt 103.5 lb

## 2014-05-26 DIAGNOSIS — M810 Age-related osteoporosis without current pathological fracture: Secondary | ICD-10-CM

## 2014-05-26 DIAGNOSIS — C50919 Malignant neoplasm of unspecified site of unspecified female breast: Secondary | ICD-10-CM

## 2014-05-26 DIAGNOSIS — C773 Secondary and unspecified malignant neoplasm of axilla and upper limb lymph nodes: Secondary | ICD-10-CM

## 2014-05-26 DIAGNOSIS — C50912 Malignant neoplasm of unspecified site of left female breast: Secondary | ICD-10-CM

## 2014-05-26 DIAGNOSIS — Z5112 Encounter for antineoplastic immunotherapy: Secondary | ICD-10-CM

## 2014-05-26 DIAGNOSIS — Z171 Estrogen receptor negative status [ER-]: Secondary | ICD-10-CM

## 2014-05-26 LAB — CBC WITH DIFFERENTIAL/PLATELET
BASO%: 1 % (ref 0.0–2.0)
BASOS ABS: 0 10*3/uL (ref 0.0–0.1)
EOS%: 3.5 % (ref 0.0–7.0)
Eosinophils Absolute: 0.2 10*3/uL (ref 0.0–0.5)
HCT: 42.7 % (ref 34.8–46.6)
HGB: 13.4 g/dL (ref 11.6–15.9)
LYMPH#: 0.8 10*3/uL — AB (ref 0.9–3.3)
LYMPH%: 19.6 % (ref 14.0–49.7)
MCH: 28.6 pg (ref 25.1–34.0)
MCHC: 31.3 g/dL — ABNORMAL LOW (ref 31.5–36.0)
MCV: 91.1 fL (ref 79.5–101.0)
MONO#: 0.5 10*3/uL (ref 0.1–0.9)
MONO%: 10.6 % (ref 0.0–14.0)
NEUT#: 2.8 10*3/uL (ref 1.5–6.5)
NEUT%: 65.3 % (ref 38.4–76.8)
Platelets: 155 10*3/uL (ref 145–400)
RBC: 4.68 10*6/uL (ref 3.70–5.45)
RDW: 14.9 % — ABNORMAL HIGH (ref 11.2–14.5)
WBC: 4.3 10*3/uL (ref 3.9–10.3)

## 2014-05-26 LAB — COMPREHENSIVE METABOLIC PANEL (CC13)
ALT: 15 U/L (ref 0–55)
AST: 28 U/L (ref 5–34)
Albumin: 4.1 g/dL (ref 3.5–5.0)
Alkaline Phosphatase: 66 U/L (ref 40–150)
Anion Gap: 11 mEq/L (ref 3–11)
BUN: 18.3 mg/dL (ref 7.0–26.0)
CHLORIDE: 108 meq/L (ref 98–109)
CO2: 24 mEq/L (ref 22–29)
Calcium: 9.9 mg/dL (ref 8.4–10.4)
Creatinine: 0.8 mg/dL (ref 0.6–1.1)
EGFR: 59 mL/min/{1.73_m2} — AB (ref >90)
Glucose: 90 mg/dl (ref 70–140)
Potassium: 4.1 mEq/L (ref 3.5–5.1)
Sodium: 142 mEq/L (ref 136–145)
TOTAL PROTEIN: 7 g/dL (ref 6.4–8.3)
Total Bilirubin: 0.62 mg/dL (ref 0.20–1.20)

## 2014-05-26 MED ORDER — ACETAMINOPHEN 325 MG PO TABS
ORAL_TABLET | ORAL | Status: AC
  Filled 2014-05-26: qty 2

## 2014-05-26 MED ORDER — SODIUM CHLORIDE 0.9 % IV SOLN
Freq: Once | INTRAVENOUS | Status: AC
  Administered 2014-05-26: 15:00:00 via INTRAVENOUS

## 2014-05-26 MED ORDER — TRASTUZUMAB CHEMO INJECTION 440 MG
6.0000 mg/kg | Freq: Once | INTRAVENOUS | Status: AC
  Administered 2014-05-26: 294 mg via INTRAVENOUS
  Filled 2014-05-26: qty 14

## 2014-05-26 MED ORDER — ZOLEDRONIC ACID 4 MG/5ML IV CONC
3.0000 mg | Freq: Once | INTRAVENOUS | Status: AC
  Administered 2014-05-26: 3 mg via INTRAVENOUS
  Filled 2014-05-26: qty 3.75

## 2014-05-26 MED ORDER — ACETAMINOPHEN 325 MG PO TABS
650.0000 mg | ORAL_TABLET | Freq: Once | ORAL | Status: AC
  Administered 2014-05-26: 650 mg via ORAL

## 2014-05-26 NOTE — Telephone Encounter (Signed)
Per staff message and POF I have scheduled appts. Advised scheduler of appts. JMW  

## 2014-05-26 NOTE — Telephone Encounter (Signed)
, °

## 2014-05-26 NOTE — Progress Notes (Signed)
[place]  Telephone:([phone] Fax:([phone]) [phone]  OFFICE PROGRESS NOTE   ID: [name]   DOB: [birth year]  MR#: [id number]  JAS#:[id number]  Cc:  [name], [name]  CHIEF COMPLAINT: metastatic breast cancer CURRENT TREATMENT: trastuzumab every 4 weeks  BREAST CANCER HISTORY: [name]'s gynecologist, Dr. Josefa [name], palpated a mass in the patient's left breast in 2004. The patient has a daughter, who is a Marine scientist, formerly I believe an oncology nurse, currently a pulmonary nurse in [place], so the patient went there for her care, and while I do not have all the workup, I do have the pathology report from October 01, 2002, which was her left lumpectomy and sentinel lymph node sampling under Dr. Kemper [name]. This showed [phone]) a 2.5 cm infiltrating ductal carcinoma with lobular features (the cells were E-cadherin positive), involving one of three sentinel lymph nodes. The anterior margin was close at less than 1 mm. The patient saw Dr. Arloa [name] here, and the option of completion mastectomy versus radiotherapy alone was discussed. He felt, and I would probably have agreed, that despite the very close margin, which was nevertheless technically negative, proceeding to radiation without "clearing the margin" further would be adequate. Accordingly, the patient did receive a total of 5040 cGy with an additional 1000 cGy boost to the left breast between September 11 and February 24, 2003.   The patient then did well, but in 2007 developed some redness and bumpiness around the left areola. She brought this to Dr. Ermelinda [name] attention, and apparently an initial biopsy was negative, as was a mammogram, however, with further development of the change, the patient had a biopsy under anesthesia, and this apparently was positive (I do not have those records). With this information, she proceeded to left mastectomy on February 26, 2006. The pathology there  ([id number]) showed dermal involvement by adenocarcinoma, with pagetoid spread and lymphatic space invasion. The closest margin being 1.2 cm from the inferior margin. Everything else being amply negative. There were no discrete masses or lesions identified in the left breast. The patient subsequently underwent left mastectomy.   There were a series of further skin recurrences, first in November of 2008. Initially, these were resected, but later there were too many lesions to resect, in addition to a large left axillary lymph node. This was biopsied in January 2011, showing the tumor now to be HER-2/neu positive. Patient has been on anti-HER-2/neu treatments since January 2011, initially with trastuzumab plus lapatinib. Lapatinib was discontinued in April 2011 secondary to side effects. Continues now to receive trastuzumab on a q. 3 week basis. Also receives a zoledronic acid every 3 months.    INTERVAL HISTORY: [name] returns today for follow up of her metastatic breast cancer, accompanied by her daughter in law. She is due for trastuzumab today. The interval history is generally unremarkable. She still refuses to walk with a cane but she has not fallen. Her hearing and vision are both getting mildly worse, but her hearing aids are working well today.   REVIEW OF SYSTEMS: [name] denies fevers, chills, nausea, or vomiting. She has regular bowel movements with help from OTC constipation meds. She is eating and drinking well. She denies headaches, dizziness, bleeding, bruising, or unexplained weight loss. She denies shortness of breath, chest pain, cough, palpitations, or fatigue. She has arthritic pain that is reduced with extra strength tylenol. She has leg cramps at night. She gets 6 hours of sleep at night whether she falls asleep at 8pm  or 11pm. She applies clobetasol cream to rashes that appear on her arms. A detailed review of systems is otherwise stable.   PAST MEDICAL HISTORY: Past Medical  History  Diagnosis Date  . Cancer     breast  . Cataract   . CAD (coronary artery disease)     cath w/ trivial disease  . Diverticulosis of colon   . Osteoporosis     s/p compression fx T12  . Macular degeneration   . Breast cancer     PAST SURGICAL HISTORY: Past Surgical History  Procedure Laterality Date  . Abdominal hysterectomy    . Breast lumpectomy    . Excision for paget's vulvar    . Vertbroplasty      T12  . Mastectomy      left  . Flexible sigmoidoscopy N/A 06/27/2012    Procedure: FLEXIBLE SIGMOIDOSCOPY;  Surgeon: [name] Dragon, MD;  Location: [facility];  Service: Endoscopy;  Laterality: N/A;    FAMILY HISTORY The patient's father died in an automobile accident at 60, the patient's mother from a stroke at 69. The patient had one brother who died from causes unknown to her at the age of 55, and a sister who had "lots of problems" and died at the age of 78, but there is no history of breast or ovarian cancer in the family.   GYNECOLOGIC HISTORY: She is GX P3. She took hormone replacement therapy briefly after menopause.   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 She has been a widow since 1997; lives independently, drives, and does all of her activities of daily living. Her daughter, [name], is a Marine scientist in [place]. Her daughter, [name], lives in [place].[name], who frequently comes with the patient to medical visits, is married to the patient's son, who is also her power of attorney. He works for the [employer]. The patient has six grandchildren and three great-grandchildren. She attends [employer].    ADVANCED DIRECTIVES: In place  HEALTH MAINTENANCE: History  Substance Use Topics  . Smoking status: Former Smoker    Quit date: 05/01/1990  . Smokeless tobacco: Never Used  . Alcohol Use: No     Colonoscopy: 02/06/2002  PAP: Not on file  Bone density: Not  on file  Lipid panel: Not on file  Allergies  Allergen Reactions  . Solifenacin Succinate     REACTION: "sick" nausea    Current Outpatient Prescriptions  Medication Sig Dispense Refill  . acetaminophen (TYLENOL) 500 MG tablet Take 1,000 mg by mouth daily.     . beta carotene w/minerals (OCUVITE) tablet Take 1 tablet by mouth daily.    Marland Kitchen BIOTIN PO Take 10,000 mg by mouth daily.    . clobetasol cream (TEMOVATE) 2.45 % Apply 1 application topically 2 (two) times daily as needed. 30 g 1  . losartan (COZAAR) 25 MG tablet Take 1 tablet (25 mg total) by mouth daily. 90 tablet 3  . trastuzumab (HERCEPTIN) 440 MG injection Inject into the vein once.      . triamcinolone cream (KENALOG) 0.1 % Apply 1 application topically 2 (two) times daily. 30 g 0  . zolendronic acid (ZOMETA) 4 MG/5ML injection Inject 4 mg into the vein every 3 (three) months.       No current facility-administered medications for this visit.    OBJECTIVE: Elderly white woman in mild distress Filed Vitals:   05/26/14 1342  BP: 172/80  Pulse: 70  Resp: 18     Body mass index is 20.21 kg/(m^2).    ECOG FS: 1   Skin: warm, dry  HEENT: sclerae anicteric, conjunctivae pink, oropharynx clear. No thrush or mucositis.  Lymph Nodes: No cervical or supraclavicular lymphadenopathy  Lungs: clear to auscultation bilaterally, no rales, wheezes, or rhonci  Heart: regular rate and rhythm  Abdomen: round, soft, non tender, positive bowel sounds  Musculoskeletal: No focal spinal tenderness, no peripheral edema , severe kyphosis Neuro: non focal, well oriented, positive affect  Breasts: left breast status post mastectomy. No evidence of recurrent disease. Left axilla benign. Right breast unremarkable.   LAB RESULTS: Lab Results  Component Value Date   WBC 4.3 05/26/2014   NEUTROABS 2.8 05/26/2014   HGB 13.4 05/26/2014   HCT 42.7 05/26/2014   MCV 91.1 05/26/2014   PLT 155 05/26/2014      Chemistry      Component Value  Date/Time   NA 142 04/28/2014 0923   NA 141 12/09/2013 1245   K 4.3 04/28/2014 0923   K 4.1 12/09/2013 1245   CL 104 12/09/2013 1245   CL 106 10/15/2012 1136   CO2 26 04/28/2014 0923   CO2 26 12/09/2013 1245   BUN 13.3 04/28/2014 0923   BUN 16 12/09/2013 1245   CREATININE 0.9 04/28/2014 0923   CREATININE 0.84 12/09/2013 1245      Component Value Date/Time   CALCIUM 10.3 04/28/2014 0923   CALCIUM 10.3 12/09/2013 1245   ALKPHOS 59 04/28/2014 0923   ALKPHOS 61 12/09/2013 1245   AST 29 04/28/2014 0923   AST 27 12/09/2013 1245   ALT 15 04/28/2014 0923   ALT 12 12/09/2013 1245   BILITOT 0.65 04/28/2014 0923   BILITOT 0.3 12/09/2013 1245       Lab Results  Component Value Date   LABCA2 17 09/26/2011     STUDIES: No results found.   Most recent echocardiogram on 05/18/14 showed an EF of 55-60%  ASSESSMENT: Ms. Edelman is a 81 y.o.  [place], [place] woman:  (1) Status post left lumpectomy and sentinel lymph node dissection in 09/2002 for a T2 N1 (Stage II) invasive ductal carcinoma, triple negative, status post radiation therapy completed in 01/2003.  (2) Local recurrence around the areola 01/2006, leading to mastectomy.  (3) A series of further skin recurrences first noted in 03/2007. Initially these were resected, later with too many lesions to resect; there was also a large left axillary lymph node which was biopsied in 04/2009 showing the tumor now to be HER-2 positive.   (4) Since 04/2009 she has been on anti-HER-2 treatment, initially with trastuzumab plus lapatinib, with a lapatinib discontinued 07/2009 because of side effects.   (5) Recurrence of vulvar Paget's disease, originally diagnosed and resected in 2004 and recurred in 02/2012.  Followed by Dr. Paula Compton and Dr. Marti Sleigh.  (6) Continues to receive trastuzumab every 4 weeks and zoledronic acid every 3 months.  (7) Nocturnal leg cramping  PLAN: Avyanna continues to do well as  far as her breast cancer is concerned. The labs were reviewed in detail and were stable. She will proceed with trastuzumab as scheduled. Her most recent echocardiogram shows a well preserved ejection fraction. Her next echo will be due in April.   Redith will continue trastuzumab every four weeks per Dr Magrinat's previous note. She will return in 3-4 months for labs and a follow up visit. She understands and agrees with this  plan. She knows the goal of treatment in her case is control. She has been encouraged to call with any issues that might arise before her next visit here.   Marcelino Duster, NP  05/26/2014 2:12 PM

## 2014-05-26 NOTE — Patient Instructions (Signed)
[facility] Discharge Instructions for Patients Receiving Chemotherapy  Today you received the following chemotherapy agents: Herceptin   To help prevent nausea and vomiting after your treatment, we encourage you to take your nausea medication as prescribed.   If you develop nausea and vomiting that is not controlled by your nausea medication, call the clinic.   BELOW ARE SYMPTOMS THAT SHOULD BE REPORTED IMMEDIATELY:  *FEVER GREATER THAN 100.5 F  *CHILLS WITH OR WITHOUT FEVER  NAUSEA AND VOMITING THAT IS NOT CONTROLLED WITH YOUR NAUSEA MEDICATION  *UNUSUAL SHORTNESS OF BREATH  *UNUSUAL BRUISING OR BLEEDING  TENDERNESS IN MOUTH AND THROAT WITH OR WITHOUT PRESENCE OF ULCERS  *URINARY PROBLEMS  *BOWEL PROBLEMS  UNUSUAL RASH Items with * indicate a potential emergency and should be followed up as soon as possible.  Feel free to call the clinic you have any questions or concerns. The clinic phone number is [phone].  Zoledronic Acid injection (Hypercalcemia, Oncology) What is this medicine? ZOLEDRONIC ACID (ZOE le dron ik AS id) lowers the amount of calcium loss from bone. It is used to treat too much calcium in your blood from cancer. It is also used to prevent complications of cancer that has spread to the bone. This medicine may be used for other purposes; ask your health care provider or pharmacist if you have questions. COMMON BRAND NAME(S): Zometa What should I tell my health care provider before I take this medicine? They need to know if you have any of these conditions: -aspirin-sensitive asthma -cancer, especially if you are receiving medicines used to treat cancer -dental disease or wear dentures -infection -kidney disease -receiving corticosteroids like dexamethasone or prednisone -an unusual or allergic reaction to zoledronic acid, other medicines, foods, dyes, or preservatives -pregnant or trying to get pregnant -breast-feeding How should I  use this medicine? This medicine is for infusion into a vein. It is given by a health care professional in a hospital or clinic setting. Talk to your pediatrician regarding the use of this medicine in children. Special care may be needed. Overdosage: If you think you have taken too much of this medicine contact a poison control center or emergency room at once. NOTE: This medicine is only for you. Do not share this medicine with others. What if I miss a dose? It is important not to miss your dose. Call your doctor or health care professional if you are unable to keep an appointment. What may interact with this medicine? -certain antibiotics given by injection -NSAIDs, medicines for pain and inflammation, like ibuprofen or naproxen -some diuretics like bumetanide, furosemide -teriparatide -thalidomide This list may not describe all possible interactions. Give your health care provider a list of all the medicines, herbs, non-prescription drugs, or dietary supplements you use. Also tell them if you smoke, drink alcohol, or use illegal drugs. Some items may interact with your medicine. What should I watch for while using this medicine? Visit your doctor or health care professional for regular checkups. It may be some time before you see the benefit from this medicine. Do not stop taking your medicine unless your doctor tells you to. Your doctor may order blood tests or other tests to see how you are doing. Women should inform their doctor if they wish to become pregnant or think they might be pregnant. There is a potential for serious side effects to an unborn child. Talk to your health care professional or pharmacist for more information. You should make sure that you get  enough calcium and vitamin D while you are taking this medicine. Discuss the foods you eat and the vitamins you take with your health care professional. Some people who take this medicine have severe bone, joint, and/or muscle pain.  This medicine may also increase your risk for jaw problems or a broken thigh bone. Tell your doctor right away if you have severe pain in your jaw, bones, joints, or muscles. Tell your doctor if you have any pain that does not go away or that gets worse. Tell your dentist and dental surgeon that you are taking this medicine. You should not have major dental surgery while on this medicine. See your dentist to have a dental exam and fix any dental problems before starting this medicine. Take good care of your teeth while on this medicine. Make sure you see your dentist for regular follow-up appointments. What side effects may I notice from receiving this medicine? Side effects that you should report to your doctor or health care professional as soon as possible: -allergic reactions like skin rash, itching or hives, swelling of the face, lips, or tongue -anxiety, confusion, or depression -breathing problems -changes in vision -eye pain -feeling faint or lightheaded, falls -jaw pain, especially after dental work -mouth sores -muscle cramps, stiffness, or weakness -trouble passing urine or change in the amount of urine Side effects that usually do not require medical attention (report to your doctor or health care professional if they continue or are bothersome): -bone, joint, or muscle pain -constipation -diarrhea -fever -hair loss -irritation at site where injected -loss of appetite -nausea, vomiting -stomach upset -trouble sleeping -trouble swallowing -weak or tired This list may not describe all possible side effects. Call your doctor for medical advice about side effects. You may report side effects to FDA at 1-800-FDA-1088. Where should I keep my medicine? This drug is given in a hospital or clinic and will not be stored at home. NOTE: This sheet is a summary. It may not cover all possible information. If you have questions about this medicine, talk to your doctor, pharmacist, or  health care provider.  2015, Elsevier/Gold Standard. (2012-09-25 13:03:13)

## 2014-05-27 ENCOUNTER — Telehealth: Payer: Self-pay | Admitting: *Deleted

## 2014-05-27 NOTE — Telephone Encounter (Signed)
Per staff message I have moved 4/20 to 4/21

## 2014-06-02 ENCOUNTER — Other Ambulatory Visit: Payer: Self-pay | Admitting: Dermatology

## 2014-06-23 ENCOUNTER — Ambulatory Visit (HOSPITAL_BASED_OUTPATIENT_CLINIC_OR_DEPARTMENT_OTHER): Payer: Medicare Other

## 2014-06-23 ENCOUNTER — Other Ambulatory Visit (HOSPITAL_BASED_OUTPATIENT_CLINIC_OR_DEPARTMENT_OTHER): Payer: Medicare Other

## 2014-06-23 DIAGNOSIS — Z5112 Encounter for antineoplastic immunotherapy: Secondary | ICD-10-CM

## 2014-06-23 DIAGNOSIS — C50912 Malignant neoplasm of unspecified site of left female breast: Secondary | ICD-10-CM

## 2014-06-23 DIAGNOSIS — C773 Secondary and unspecified malignant neoplasm of axilla and upper limb lymph nodes: Secondary | ICD-10-CM

## 2014-06-23 LAB — COMPREHENSIVE METABOLIC PANEL (CC13)
ALBUMIN: 4 g/dL (ref 3.5–5.0)
ALT: 14 U/L (ref 0–55)
ANION GAP: 10 meq/L (ref 3–11)
AST: 28 U/L (ref 5–34)
Alkaline Phosphatase: 63 U/L (ref 40–150)
BUN: 12.2 mg/dL (ref 7.0–26.0)
CHLORIDE: 109 meq/L (ref 98–109)
CO2: 25 meq/L (ref 22–29)
Calcium: 10.2 mg/dL (ref 8.4–10.4)
Creatinine: 0.8 mg/dL (ref 0.6–1.1)
EGFR: 59 mL/min/{1.73_m2} — ABNORMAL LOW (ref >90)
Glucose: 121 mg/dl (ref 70–140)
Potassium: 4 mEq/L (ref 3.5–5.1)
SODIUM: 144 meq/L (ref 136–145)
TOTAL PROTEIN: 6.8 g/dL (ref 6.4–8.3)
Total Bilirubin: 0.49 mg/dL (ref 0.20–1.20)

## 2014-06-23 LAB — CBC WITH DIFFERENTIAL/PLATELET
BASO%: 0.7 % (ref 0.0–2.0)
Basophils Absolute: 0 10*3/uL (ref 0.0–0.1)
EOS ABS: 0.2 10*3/uL (ref 0.0–0.5)
EOS%: 4.2 % (ref 0.0–7.0)
HEMATOCRIT: 43.7 % (ref 34.8–46.6)
HGB: 14 g/dL (ref 11.6–15.9)
LYMPH%: 19.5 % (ref 14.0–49.7)
MCH: 29.7 pg (ref 25.1–34.0)
MCHC: 32 g/dL (ref 31.5–36.0)
MCV: 92.6 fL (ref 79.5–101.0)
MONO#: 0.4 10*3/uL (ref 0.1–0.9)
MONO%: 10.4 % (ref 0.0–14.0)
NEUT%: 65.2 % (ref 38.4–76.8)
NEUTROS ABS: 2.6 10*3/uL (ref 1.5–6.5)
PLATELETS: 134 10*3/uL — AB (ref 145–400)
RBC: 4.72 10*6/uL (ref 3.70–5.45)
RDW: 14.7 % — ABNORMAL HIGH (ref 11.2–14.5)
WBC: 4.1 10*3/uL (ref 3.9–10.3)
lymph#: 0.8 10*3/uL — ABNORMAL LOW (ref 0.9–3.3)

## 2014-06-23 MED ORDER — SODIUM CHLORIDE 0.9 % IV SOLN
Freq: Once | INTRAVENOUS | Status: AC
  Administered 2014-06-23: 12:00:00 via INTRAVENOUS

## 2014-06-23 MED ORDER — TRASTUZUMAB CHEMO INJECTION 440 MG
6.0000 mg/kg | Freq: Once | INTRAVENOUS | Status: AC
  Administered 2014-06-23: 294 mg via INTRAVENOUS
  Filled 2014-06-23: qty 14

## 2014-06-23 MED ORDER — ACETAMINOPHEN 325 MG PO TABS
ORAL_TABLET | ORAL | Status: AC
  Filled 2014-06-23: qty 2

## 2014-06-23 MED ORDER — ACETAMINOPHEN 325 MG PO TABS
650.0000 mg | ORAL_TABLET | Freq: Once | ORAL | Status: AC
  Administered 2014-06-23: 650 mg via ORAL

## 2014-06-23 NOTE — Patient Instructions (Signed)
[facility] Discharge Instructions for Patients Receiving Chemotherapy  Today you received the following chemotherapy agents: Herceptin  To help prevent nausea and vomiting after your treatment, we encourage you to take your nausea medication as prescribed by your physician.    If you develop nausea and vomiting that is not controlled by your nausea medication, call the clinic.   BELOW ARE SYMPTOMS THAT SHOULD BE REPORTED IMMEDIATELY:  *FEVER GREATER THAN 100.5 F  *CHILLS WITH OR WITHOUT FEVER  NAUSEA AND VOMITING THAT IS NOT CONTROLLED WITH YOUR NAUSEA MEDICATION  *UNUSUAL SHORTNESS OF BREATH  *UNUSUAL BRUISING OR BLEEDING  TENDERNESS IN MOUTH AND THROAT WITH OR WITHOUT PRESENCE OF ULCERS  *URINARY PROBLEMS  *BOWEL PROBLEMS  UNUSUAL RASH Items with * indicate a potential emergency and should be followed up as soon as possible.  Feel free to call the clinic you have any questions or concerns. The clinic phone number is [phone].    

## 2014-07-09 ENCOUNTER — Ambulatory Visit: Payer: Medicare Other | Admitting: Gynecology

## 2014-07-20 ENCOUNTER — Other Ambulatory Visit: Payer: Self-pay | Admitting: Oncology

## 2014-07-21 ENCOUNTER — Other Ambulatory Visit (HOSPITAL_BASED_OUTPATIENT_CLINIC_OR_DEPARTMENT_OTHER): Payer: Medicare Other

## 2014-07-21 ENCOUNTER — Ambulatory Visit (HOSPITAL_BASED_OUTPATIENT_CLINIC_OR_DEPARTMENT_OTHER): Payer: Medicare Other

## 2014-07-21 DIAGNOSIS — Z5112 Encounter for antineoplastic immunotherapy: Secondary | ICD-10-CM

## 2014-07-21 DIAGNOSIS — C773 Secondary and unspecified malignant neoplasm of axilla and upper limb lymph nodes: Secondary | ICD-10-CM | POA: Diagnosis not present

## 2014-07-21 DIAGNOSIS — C50912 Malignant neoplasm of unspecified site of left female breast: Secondary | ICD-10-CM

## 2014-07-21 LAB — COMPREHENSIVE METABOLIC PANEL (CC13)
ALBUMIN: 4.3 g/dL (ref 3.5–5.0)
ALT: 18 U/L (ref 0–55)
AST: 29 U/L (ref 5–34)
Alkaline Phosphatase: 62 U/L (ref 40–150)
Anion Gap: 11 mEq/L (ref 3–11)
BUN: 15.4 mg/dL (ref 7.0–26.0)
CO2: 24 meq/L (ref 22–29)
Calcium: 10.2 mg/dL (ref 8.4–10.4)
Chloride: 106 mEq/L (ref 98–109)
Creatinine: 0.9 mg/dL (ref 0.6–1.1)
EGFR: 56 mL/min/{1.73_m2} — AB (ref >90)
GLUCOSE: 86 mg/dL (ref 70–140)
POTASSIUM: 4.3 meq/L (ref 3.5–5.1)
Sodium: 141 mEq/L (ref 136–145)
TOTAL PROTEIN: 7.6 g/dL (ref 6.4–8.3)
Total Bilirubin: 0.76 mg/dL (ref 0.20–1.20)

## 2014-07-21 LAB — CBC WITH DIFFERENTIAL/PLATELET
BASO%: 0.7 % (ref 0.0–2.0)
Basophils Absolute: 0 10*3/uL (ref 0.0–0.1)
EOS%: 2.8 % (ref 0.0–7.0)
Eosinophils Absolute: 0.2 10*3/uL (ref 0.0–0.5)
HCT: 43.6 % (ref 34.8–46.6)
HGB: 13.9 g/dL (ref 11.6–15.9)
LYMPH%: 14.4 % (ref 14.0–49.7)
MCH: 28.9 pg (ref 25.1–34.0)
MCHC: 31.9 g/dL (ref 31.5–36.0)
MCV: 90.7 fL (ref 79.5–101.0)
MONO#: 0.6 10*3/uL (ref 0.1–0.9)
MONO%: 10.5 % (ref 0.0–14.0)
NEUT%: 71.6 % (ref 38.4–76.8)
NEUTROS ABS: 3.9 10*3/uL (ref 1.5–6.5)
Platelets: 165 10*3/uL (ref 145–400)
RBC: 4.81 10*6/uL (ref 3.70–5.45)
RDW: 14.7 % — AB (ref 11.2–14.5)
WBC: 5.5 10*3/uL (ref 3.9–10.3)
lymph#: 0.8 10*3/uL — ABNORMAL LOW (ref 0.9–3.3)

## 2014-07-21 MED ORDER — TRASTUZUMAB CHEMO INJECTION 440 MG
6.0000 mg/kg | Freq: Once | INTRAVENOUS | Status: AC
  Administered 2014-07-21: 294 mg via INTRAVENOUS
  Filled 2014-07-21: qty 14

## 2014-07-21 MED ORDER — ACETAMINOPHEN 325 MG PO TABS
650.0000 mg | ORAL_TABLET | Freq: Once | ORAL | Status: AC
  Administered 2014-07-21: 650 mg via ORAL

## 2014-07-21 MED ORDER — SODIUM CHLORIDE 0.9 % IV SOLN
Freq: Once | INTRAVENOUS | Status: AC
  Administered 2014-07-21: 12:00:00 via INTRAVENOUS

## 2014-07-21 MED ORDER — ACETAMINOPHEN 325 MG PO TABS
ORAL_TABLET | ORAL | Status: AC
  Filled 2014-07-21: qty 2

## 2014-07-21 NOTE — Patient Instructions (Signed)
[facility] Discharge Instructions for Patients Receiving Chemotherapy  Today you received the following chemotherapy agents Herceptin  To help prevent nausea and vomiting after your treatment, we encourage you to take your nausea medication    If you develop nausea and vomiting that is not controlled by your nausea medication, call the clinic.   BELOW ARE SYMPTOMS THAT SHOULD BE REPORTED IMMEDIATELY:  *FEVER GREATER THAN 100.5 F  *CHILLS WITH OR WITHOUT FEVER  NAUSEA AND VOMITING THAT IS NOT CONTROLLED WITH YOUR NAUSEA MEDICATION  *UNUSUAL SHORTNESS OF BREATH  *UNUSUAL BRUISING OR BLEEDING  TENDERNESS IN MOUTH AND THROAT WITH OR WITHOUT PRESENCE OF ULCERS  *URINARY PROBLEMS  *BOWEL PROBLEMS  UNUSUAL RASH Items with * indicate a potential emergency and should be followed up as soon as possible.  Feel free to call the clinic you have any questions or concerns. The clinic phone number is [phone].  Please show the CHEMO ALERT CARD at check-in to the Emergency Department and triage nurse.   

## 2014-08-13 ENCOUNTER — Encounter: Payer: Self-pay | Admitting: Gynecology

## 2014-08-13 ENCOUNTER — Ambulatory Visit: Payer: Medicare Other | Attending: Gynecology | Admitting: Gynecology

## 2014-08-13 VITALS — BP 173/86 | HR 62 | Resp 20 | Ht 60.0 in | Wt 103.0 lb

## 2014-08-13 DIAGNOSIS — Z9012 Acquired absence of left breast and nipple: Secondary | ICD-10-CM | POA: Diagnosis not present

## 2014-08-13 DIAGNOSIS — Z853 Personal history of malignant neoplasm of breast: Secondary | ICD-10-CM | POA: Insufficient documentation

## 2014-08-13 DIAGNOSIS — C519 Malignant neoplasm of vulva, unspecified: Secondary | ICD-10-CM

## 2014-08-13 DIAGNOSIS — Z87891 Personal history of nicotine dependence: Secondary | ICD-10-CM | POA: Diagnosis not present

## 2014-08-13 DIAGNOSIS — Z79899 Other long term (current) drug therapy: Secondary | ICD-10-CM | POA: Diagnosis not present

## 2014-08-13 DIAGNOSIS — C4499 Other specified malignant neoplasm of skin, unspecified: Secondary | ICD-10-CM | POA: Insufficient documentation

## 2014-08-13 DIAGNOSIS — Z90710 Acquired absence of both cervix and uterus: Secondary | ICD-10-CM | POA: Diagnosis not present

## 2014-08-13 DIAGNOSIS — K5730 Diverticulosis of large intestine without perforation or abscess without bleeding: Secondary | ICD-10-CM | POA: Diagnosis not present

## 2014-08-13 DIAGNOSIS — I2510 Atherosclerotic heart disease of native coronary artery without angina pectoris: Secondary | ICD-10-CM | POA: Insufficient documentation

## 2014-08-13 DIAGNOSIS — H3530 Unspecified macular degeneration: Secondary | ICD-10-CM | POA: Diagnosis not present

## 2014-08-13 DIAGNOSIS — M810 Age-related osteoporosis without current pathological fracture: Secondary | ICD-10-CM | POA: Insufficient documentation

## 2014-08-13 NOTE — Progress Notes (Signed)
Consult Note: Gyn-Onc   [name] 81 y.o. female  Chief Complaint  Patient presents with  . pagets disease    Assessment : Paget's disease of the vulva which is unchanged on clinical examination. The patient is very happy with clobetasol which is relieving all of her symptoms.  Plan: Continue clobetasol on a when necessary basis. Return to see me in one year or as needed. Surgical options were discussed with the patient is content with her current medical management.  Interval History: In November 2013 the patient was treated with clobetasol twice a day. Over the past year or so she's been using it only on a when necessary basis since he is very satisfied. On average she uses about twice a week. She reports her symptoms are nearly completely resolved. She has no new vulvar lesions.  HPI:   81 year old white female initially seen in consultation request of Dr. Paula Compton regarding management of recurrent vulvar Paget's disease. The patient's history dates back to at least 2004 where she had symptomatic vulvar Paget's disease. She's had 2 surgical resections of lesions.  Recently she has noted more irritation of the left vulva. She saw Dr. Marvel Plan on October 21 , 2013 who obtained a biopsy of the left vulvar lesion showing recurrent Paget's disease. The patient's symptoms at the present time seem relatively mild other she describes this as "irritation". She denies any bleeding or any other vulvar pain.  At our initial visit November 2013 we instituted a medical management regimen using clobetasol which resulted in good relief of her symptoms.   Review of Systems:10 point review of systems is negative except as noted in interval history.   Vitals: Blood pressure 173/86, pulse 62, resp. rate 20, height 5' (1.524 m), weight 103 lb (46.72 kg).  Physical Exam: General : The patient is a healthy woman in no acute distress.  HEENT: normocephalic, extraoccular movements normal;  neck is supple without thyromegally  Lynphnodes: Supraclavicular and inguinal nodes not enlarged  Abdomen: Soft, non-tender, no ascites, no organomegally, no masses, no hernias  Pelvic:  EGBUS: Normal female . There is a 4 x 4 centimeter area of erythematous skin on the left anterior vulva which is unchanged from prior visit. Vagina: Normal, no lesions atrophic Urethra and Bladder: Normal, non-tender  Cervix: Surgically absent  Uterus: Surgically absent  Bi-manual examination: Non-tender; no adenxal masses or nodularity  Rectal: normal sphincter tone, no masses, no blood  Lower extremities: No edema or varicosities. Normal range of motion      Allergies  Allergen Reactions  . Solifenacin Succinate     REACTION: "sick" nausea    Past Medical History  Diagnosis Date  . Cancer     breast  . Cataract   . CAD (coronary artery disease)     cath w/ trivial disease  . Diverticulosis of colon   . Osteoporosis     s/p compression fx T12  . Macular degeneration   . Breast cancer     Past Surgical History  Procedure Laterality Date  . Abdominal hysterectomy    . Breast lumpectomy    . Excision for paget's vulvar    . Vertbroplasty      T12  . Mastectomy      left  . Flexible sigmoidoscopy N/A 06/27/2012    Procedure: FLEXIBLE SIGMOIDOSCOPY;  Surgeon: Lafayette Dragon, MD;  Location: [facility];  Service: Endoscopy;  Laterality: N/A;    Current Outpatient Prescriptions  Medication Sig Dispense Refill  .  beta carotene w/minerals (OCUVITE) tablet Take 1 tablet by mouth daily.    Marland Kitchen losartan (COZAAR) 25 MG tablet Take 1 tablet (25 mg total) by mouth daily. 90 tablet 3  . acetaminophen (TYLENOL) 500 MG tablet Take 1,000 mg by mouth daily.      No current facility-administered medications for this visit.    History   Social History  . Marital Status: Widowed    Spouse Name: N/A  . Number of Children: N/A  . Years of Education: N/A   Occupational History  . Not on file.    Social History Main Topics  . Smoking status: Former Smoker    Quit date: 05/01/1990  . Smokeless tobacco: Never Used  . Alcohol Use: No  . Drug Use: No  . Sexual Activity: No   Other Topics Concern  . Not on file   Social History Narrative   HSG, no college. Married '44- '97 widowed. 2 dtrs - '46, '54, 1 son - '49; 6 grandchildren; 5 great-grands. 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 She lives alone.    End of life care: Do not resuscitate; willing to have intensive care excluding mechanical ventilation.     Family History  Problem Relation Age of Onset  . Cancer Sister   . Hypertension Mother       Alvino Chapel, MD 08/13/2014, 12:24 PM

## 2014-08-13 NOTE — Patient Instructions (Signed)
Continue use clobetasol twice a day as needed for itching. Return to see me in one year.

## 2014-08-18 ENCOUNTER — Ambulatory Visit: Payer: Medicare Other

## 2014-08-19 ENCOUNTER — Telehealth: Payer: Self-pay | Admitting: Oncology

## 2014-08-19 ENCOUNTER — Other Ambulatory Visit: Payer: Self-pay | Admitting: *Deleted

## 2014-08-19 ENCOUNTER — Ambulatory Visit (HOSPITAL_BASED_OUTPATIENT_CLINIC_OR_DEPARTMENT_OTHER): Payer: Medicare Other | Admitting: Oncology

## 2014-08-19 ENCOUNTER — Ambulatory Visit (HOSPITAL_BASED_OUTPATIENT_CLINIC_OR_DEPARTMENT_OTHER): Payer: Medicare Other

## 2014-08-19 ENCOUNTER — Other Ambulatory Visit (HOSPITAL_BASED_OUTPATIENT_CLINIC_OR_DEPARTMENT_OTHER): Payer: Medicare Other

## 2014-08-19 VITALS — BP 139/62 | HR 60 | Temp 97.4°F | Resp 18 | Ht 60.0 in | Wt 103.5 lb

## 2014-08-19 DIAGNOSIS — Z5112 Encounter for antineoplastic immunotherapy: Secondary | ICD-10-CM

## 2014-08-19 DIAGNOSIS — C50912 Malignant neoplasm of unspecified site of left female breast: Secondary | ICD-10-CM

## 2014-08-19 DIAGNOSIS — D630 Anemia in neoplastic disease: Secondary | ICD-10-CM

## 2014-08-19 DIAGNOSIS — C773 Secondary and unspecified malignant neoplasm of axilla and upper limb lymph nodes: Secondary | ICD-10-CM | POA: Diagnosis not present

## 2014-08-19 DIAGNOSIS — Z171 Estrogen receptor negative status [ER-]: Secondary | ICD-10-CM

## 2014-08-19 DIAGNOSIS — I2510 Atherosclerotic heart disease of native coronary artery without angina pectoris: Secondary | ICD-10-CM

## 2014-08-19 DIAGNOSIS — C50919 Malignant neoplasm of unspecified site of unspecified female breast: Secondary | ICD-10-CM

## 2014-08-19 DIAGNOSIS — R252 Cramp and spasm: Secondary | ICD-10-CM

## 2014-08-19 DIAGNOSIS — M810 Age-related osteoporosis without current pathological fracture: Secondary | ICD-10-CM | POA: Diagnosis not present

## 2014-08-19 LAB — COMPREHENSIVE METABOLIC PANEL (CC13)
ALT: 14 U/L (ref 0–55)
ANION GAP: 12 meq/L — AB (ref 3–11)
AST: 24 U/L (ref 5–34)
Albumin: 3.9 g/dL (ref 3.5–5.0)
Alkaline Phosphatase: 58 U/L (ref 40–150)
BILIRUBIN TOTAL: 0.37 mg/dL (ref 0.20–1.20)
BUN: 16.1 mg/dL (ref 7.0–26.0)
CHLORIDE: 108 meq/L (ref 98–109)
CO2: 23 meq/L (ref 22–29)
CREATININE: 0.8 mg/dL (ref 0.6–1.1)
Calcium: 10.2 mg/dL (ref 8.4–10.4)
EGFR: 63 mL/min/{1.73_m2} — ABNORMAL LOW (ref >90)
Glucose: 82 mg/dl (ref 70–140)
Potassium: 4.6 mEq/L (ref 3.5–5.1)
Sodium: 142 mEq/L (ref 136–145)
TOTAL PROTEIN: 6.7 g/dL (ref 6.4–8.3)

## 2014-08-19 LAB — CBC WITH DIFFERENTIAL/PLATELET
BASO%: 0.7 % (ref 0.0–2.0)
Basophils Absolute: 0 10*3/uL (ref 0.0–0.1)
EOS%: 3.2 % (ref 0.0–7.0)
Eosinophils Absolute: 0.1 10*3/uL (ref 0.0–0.5)
HEMATOCRIT: 43.1 % (ref 34.8–46.6)
HGB: 13.7 g/dL (ref 11.6–15.9)
LYMPH#: 0.8 10*3/uL — AB (ref 0.9–3.3)
LYMPH%: 18.6 % (ref 14.0–49.7)
MCH: 29.8 pg (ref 25.1–34.0)
MCHC: 31.8 g/dL (ref 31.5–36.0)
MCV: 93.9 fL (ref 79.5–101.0)
MONO#: 0.6 10*3/uL (ref 0.1–0.9)
MONO%: 13.6 % (ref 0.0–14.0)
NEUT#: 2.8 10*3/uL (ref 1.5–6.5)
NEUT%: 63.9 % (ref 38.4–76.8)
Platelets: 156 10*3/uL (ref 145–400)
RBC: 4.59 10*6/uL (ref 3.70–5.45)
RDW: 14.2 % (ref 11.2–14.5)
WBC: 4.4 10*3/uL (ref 3.9–10.3)

## 2014-08-19 MED ORDER — SODIUM CHLORIDE 0.9 % IV SOLN
3.0000 mg | Freq: Once | INTRAVENOUS | Status: AC
  Administered 2014-08-19: 3 mg via INTRAVENOUS
  Filled 2014-08-19: qty 3.75

## 2014-08-19 MED ORDER — TRASTUZUMAB CHEMO INJECTION 440 MG
6.0000 mg/kg | Freq: Once | INTRAVENOUS | Status: AC
  Administered 2014-08-19: 294 mg via INTRAVENOUS
  Filled 2014-08-19: qty 14

## 2014-08-19 MED ORDER — SODIUM CHLORIDE 0.9 % IV SOLN
Freq: Once | INTRAVENOUS | Status: AC
  Administered 2014-08-19: 11:00:00 via INTRAVENOUS

## 2014-08-19 MED ORDER — ACETAMINOPHEN 325 MG PO TABS
650.0000 mg | ORAL_TABLET | Freq: Once | ORAL | Status: AC
  Administered 2014-08-19: 650 mg via ORAL

## 2014-08-19 MED ORDER — ACETAMINOPHEN 325 MG PO TABS
ORAL_TABLET | ORAL | Status: AC
  Filled 2014-08-19: qty 2

## 2014-08-19 NOTE — Telephone Encounter (Signed)
Scheduled herceptin per staff message/care plan

## 2014-08-19 NOTE — Progress Notes (Signed)
[facility]  [phone] Fax:(336) [phone]  OFFICE PROGRESS NOTE   ID: [name]   DOB: December 17, 1913  MR#: [id number]  XOV#:[id number]  Cc:  [name], [name]  CHIEF COMPLAINT: metastatic breast cancer  CURRENT TREATMENT: trastuzumab every 4 weeks  BREAST CANCER HISTORY: From the original intake note:  [name]'s gynecologist, [name], palpated a mass in the patient's left breast in 2004. The patient has a daughter, who is a Marine scientist, formerly I believe an oncology nurse, currently a pulmonary nurse in [place], so the patient went there for her care, and while I do not have all the workup, I do have the pathology report from October 01, 2002, which was her left lumpectomy and sentinel lymph node sampling under [name]. This showed [phone]) a 2.5 cm infiltrating ductal carcinoma with lobular features (the cells were E-cadherin positive), involving one of three sentinel lymph nodes. The anterior margin was close at less than 1 mm. The patient saw [name] here, and the option of completion mastectomy versus radiotherapy alone was discussed. He felt, and I would probably have agreed, that despite the very close margin, which was nevertheless technically negative, proceeding to radiation without "clearing the margin" further would be adequate. Accordingly, the patient did receive a total of 5040 cGy with an additional 1000 cGy boost to the left breast between September 11 and February 24, 2003.   The patient then did well, but in 2007 developed some redness and bumpiness around the left areola. She brought this to [name] attention, and apparently an initial biopsy was negative, as was a mammogram, however, with further development of the change, the patient had a biopsy under anesthesia, and this apparently was positive (I do not have those records). With this information, she proceeded to left mastectomy on February 26, 2006. The pathology there ([id number]) showed dermal involvement by adenocarcinoma, with pagetoid spread and lymphatic space invasion. The closest margin being 1.2 cm from the inferior margin. Everything else being amply negative. There were no discrete masses or lesions identified in the left breast. The patient subsequently underwent left mastectomy.   There were a series of further skin recurrences, first in November of 2008. Initially, these were resected, but later there were too many lesions to resect, in addition to a large left axillary lymph node. This was biopsied in January 2011, showing the tumor now to be HER-2/neu positive. Patient has been on anti-HER-2/neu treatments since January 2011, initially with trastuzumab plus [name]. [name] was discontinued in April 2011 secondary to side effects. Continues now to receive trastuzumab on a q. 3 week basis. Also receives a zoledronic acid every 3 months.    INTERVAL HISTORY: [name] returns today for follow up of her metastatic breast cancer, accompanied by her daughter in law. The interval history is generally unremarkable. She did have a fall in March with significant rib pain, but no fractures. She has recovered nicely from that. She is tolerating the trastuzumab with no side effects that she is aware of   REVIEW OF SYSTEMS: [name] is worried about her teeth because she had a root canal recently and now it sounds like she will need to have another one. She left her hearing aid at home today. She has some stress urinary incontinence. She has mild arthritis symptoms here in there. Her vision continues to deteriorate. She is using a cane to walk, and there have not been any further falls. A detailed review of  systems today was otherwise stable   PAST MEDICAL HISTORY: Past Medical History  Diagnosis Date  . Cancer     breast  . Cataract   . CAD (coronary artery disease)     cath w/ trivial disease  . Diverticulosis of colon   .  Osteoporosis     s/p compression fx T12  . Macular degeneration   . Breast cancer     PAST SURGICAL HISTORY: Past Surgical History  Procedure Laterality Date  . Abdominal hysterectomy    . Breast lumpectomy    . Excision for paget's vulvar    . Vertbroplasty      T12  . Mastectomy      left  . Flexible sigmoidoscopy N/A 06/27/2012    Procedure: FLEXIBLE SIGMOIDOSCOPY;  Surgeon: [name] Dragon, MD;  Location: [facility];  Service: Endoscopy;  Laterality: N/A;    FAMILY HISTORY The patient's father died in an automobile accident at 49, the patient's mother from a stroke at 77. The patient had one brother who died from causes unknown to her at the age of 74, and a sister who had "lots of problems" and died at the age of 8, but there is no history of breast or ovarian cancer in the family.   GYNECOLOGIC HISTORY: She is GX P3. She took hormone replacement therapy briefly after menopause.   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 She has been a widow since 1997; lives independently, drives, and does all of her activities of daily living. Her daughter, [name], is a Marine scientist in [place]. Her daughter, [name], lives in [place].[name], who frequently comes with the patient to medical visits, is married to the patient's son, who is also her power of attorney. He works for the [employer]. The patient has six grandchildren and three great-grandchildren. She attends [employer].    ADVANCED DIRECTIVES: In place  HEALTH MAINTENANCE: History  Substance Use Topics  . Smoking status: Former Smoker    Quit date: 05/01/1990  . Smokeless tobacco: Never Used  . Alcohol Use: No     Colonoscopy: 02/06/2002  PAP: Not on file  Bone density: Not on file  Lipid panel: Not on file  Allergies  Allergen Reactions  . Solifenacin Succinate     REACTION: "sick" nausea    Current Outpatient  Prescriptions  Medication Sig Dispense Refill  . acetaminophen (TYLENOL) 500 MG tablet Take 1,000 mg by mouth daily.     . beta carotene w/minerals (OCUVITE) tablet Take 1 tablet by mouth daily.    Marland Kitchen losartan (COZAAR) 25 MG tablet Take 1 tablet (25 mg total) by mouth daily. 90 tablet 3   No current facility-administered medications for this visit.    OBJECTIVE: Elderly white woman in no acute distress Filed Vitals:   08/19/14 0952  BP: 139/62  Pulse: 60  Temp: 97.4 F (36.3 C)  Resp: 18     Body mass index is 20.21 kg/(m^2).    ECOG FS: 1  Sclerae unicteric, EOMs intact Oropharynx clear, no thrush or other lesions No cervical or supraclavicular adenopathy Lungs no rales or rhonchi Heart regular rate and rhythm Abd soft, nontender, positive bowel sounds MSK kyphosis but no focal spinal tenderness, no upper extremity lymphedema Neuro: nonfocal, well oriented, positive affect Breasts: the right breast is unremarkable. The left breast is status post mastectomy. There is no evidence of further or active chest  wall recurrence. Left axilla is benign    LAB RESULTS: Lab Results  Component Value Date   WBC 4.4 08/19/2014   NEUTROABS 2.8 08/19/2014   HGB 13.7 08/19/2014   HCT 43.1 08/19/2014   MCV 93.9 08/19/2014   PLT 156 08/19/2014      Chemistry      Component Value Date/Time   NA 142 08/19/2014 0940   NA 141 12/09/2013 1245   K 4.6 08/19/2014 0940   K 4.1 12/09/2013 1245   CL 104 12/09/2013 1245   CL 106 10/15/2012 1136   CO2 23 08/19/2014 0940   CO2 26 12/09/2013 1245   BUN 16.1 08/19/2014 0940   BUN 16 12/09/2013 1245   CREATININE 0.8 08/19/2014 0940   CREATININE 0.84 12/09/2013 1245      Component Value Date/Time   CALCIUM 10.2 08/19/2014 0940   CALCIUM 10.3 12/09/2013 1245   ALKPHOS 58 08/19/2014 0940   ALKPHOS 61 12/09/2013 1245   AST 24 08/19/2014 0940   AST 27 12/09/2013 1245   ALT 14 08/19/2014 0940   ALT 12 12/09/2013 1245   BILITOT 0.37  08/19/2014 0940   BILITOT 0.3 12/09/2013 1245       Lab Results  Component Value Date   LABCA2 17 09/26/2011     STUDIES: No results found.     ASSESSMENT: [name] is a 81 y.o.  [place], [place] woman:  (1) Status post left lumpectomy and sentinel lymph node dissection in 09/2002 for a T2 N1 (Stage II) invasive ductal carcinoma, triple negative, status post radiation therapy completed in 01/2003.  (2) Local recurrence around the areola 01/2006, leading to mastectomy.  (3) A series of further skin recurrences first noted in 03/2007. Initially these were resected, later with too many lesions to resect; there was also a large left axillary lymph node which was biopsied in 04/2009 showing the tumor now to be HER-2 positive.   (4) Since 04/2009 she has been on anti-HER-2 treatment, initially with trastuzumab plus [name], with a [name] discontinued 07/2009 because of side effects.   (5) Recurrence of vulvar Paget's disease, originally diagnosed and resected in 2004 and recurred in 02/2012.  Followed by Dr. Paula Compton and Dr. Marti Sleigh.  (6) Continues to receive trastuzumab every 4 weeks and zoledronic acid every 3 months.  (a)  Echocardiogram 05/18/2014 was unremarkable  (7) Nocturnal leg cramping  PLAN: [name] is doing terrific with no evidence of disease activity from her stage IV breast cancer. She is tolerating the trastuzumab well. We are doing that once a month instead of every 3 weeks and she gets her echocardiograms every 4 months.  She needs a right mammogram and I have put that in for this coming week. Her next echocardiogram will be in mid May.  She is considering moving to an independent living situation. I think that would be a good move on her part and it would give her family some peace of mind. Her son particularly worries whenever he thinks about traveling that she might get into real trouble when he is not in town and no one  might be there.  The plan continues to be 4 trastuzumab every month until there is evidence of disease progression or toxicity. She will see as again in 3 months from now. She knows to call for any problems that may develop before then.  Chauncey Cruel, MD  08/19/2014 10:23 AM

## 2014-08-19 NOTE — Telephone Encounter (Signed)
per pof to sch pt appt-sent email to MW to sch pt trmt-sent Vaughan Basta email to pre-cert for [place] @ [employer]

## 2014-08-19 NOTE — Patient Instructions (Addendum)
[facility] Discharge Instructions for Patients Receiving Chemotherapy  Today you received the following chemotherapy agents: Herceptin, Zometa  To help prevent nausea and vomiting after your treatment, we encourage you to take your nausea medication as prescribed by your physician.   If you develop nausea and vomiting that is not controlled by your nausea medication, call the clinic.   BELOW ARE SYMPTOMS THAT SHOULD BE REPORTED IMMEDIATELY:  *FEVER GREATER THAN 100.5 F  *CHILLS WITH OR WITHOUT FEVER  NAUSEA AND VOMITING THAT IS NOT CONTROLLED WITH YOUR NAUSEA MEDICATION  *UNUSUAL SHORTNESS OF BREATH  *UNUSUAL BRUISING OR BLEEDING  TENDERNESS IN MOUTH AND THROAT WITH OR WITHOUT PRESENCE OF ULCERS  *URINARY PROBLEMS  *BOWEL PROBLEMS  UNUSUAL RASH Items with * indicate a potential emergency and should be followed up as soon as possible.  Feel free to call the clinic you have any questions or concerns. The clinic phone number is [phone].  Please show the [place] at check-in to the Emergency Department and triage nurse.

## 2014-09-14 ENCOUNTER — Other Ambulatory Visit: Payer: Self-pay | Admitting: *Deleted

## 2014-09-14 ENCOUNTER — Telehealth: Payer: Self-pay | Admitting: Oncology

## 2014-09-14 DIAGNOSIS — I159 Secondary hypertension, unspecified: Secondary | ICD-10-CM

## 2014-09-14 DIAGNOSIS — C50912 Malignant neoplasm of unspecified site of left female breast: Secondary | ICD-10-CM

## 2014-09-14 DIAGNOSIS — I2583 Coronary atherosclerosis due to lipid rich plaque: Secondary | ICD-10-CM

## 2014-09-14 DIAGNOSIS — I2510 Atherosclerotic heart disease of native coronary artery without angina pectoris: Secondary | ICD-10-CM

## 2014-09-14 NOTE — Telephone Encounter (Signed)
cld & spoke to pt daughter-in-law Juliann Pulse to adv of time & dtae of appt-she understood

## 2014-09-14 NOTE — Telephone Encounter (Signed)
Per pof patient needs an echo,echo was stated in [facility] from 4/21 but there is no echo order,also looks like clamensia sent a note to [place] for precert,i have left a voicemail for val to look into this,anne

## 2014-09-14 NOTE — Progress Notes (Signed)
This RN was notified by pharmacy of need for ECHO for herceptin therapy.  Noted MD placed order for ECHO 08/19/2014- and POF sent to scheduling.  This RN sent a second POF requesting URGENT scheduling.  Was informed to contact Vaughan Basta in managed care to see if precerted and then contact scheduler associated with order for that day.  Of note scheduler per above states she could not see order in pt's " appointment desk ".  Noted order viewable by this RN in " order review ".  Per Vaughan Basta - ECHO did not need precert.  This RN contacted scheduler who stated she could not see order as above.  This RN re-entered order and verified as " viewable " with request for urgent scheduling.  MD made aware of the above.

## 2014-09-14 NOTE — Telephone Encounter (Signed)
per pof to sch pt appt-cld pt didnot get an answer-adv Val sch per Val NPR per Linda-will try & call pt again

## 2014-09-15 ENCOUNTER — Other Ambulatory Visit (HOSPITAL_BASED_OUTPATIENT_CLINIC_OR_DEPARTMENT_OTHER): Payer: Medicare Other

## 2014-09-15 ENCOUNTER — Ambulatory Visit (HOSPITAL_BASED_OUTPATIENT_CLINIC_OR_DEPARTMENT_OTHER): Payer: Medicare Other

## 2014-09-15 VITALS — BP 155/76 | HR 55 | Temp 95.3°F | Resp 18

## 2014-09-15 DIAGNOSIS — C50912 Malignant neoplasm of unspecified site of left female breast: Secondary | ICD-10-CM

## 2014-09-15 DIAGNOSIS — Z5112 Encounter for antineoplastic immunotherapy: Secondary | ICD-10-CM

## 2014-09-15 DIAGNOSIS — C773 Secondary and unspecified malignant neoplasm of axilla and upper limb lymph nodes: Secondary | ICD-10-CM

## 2014-09-15 LAB — CBC WITH DIFFERENTIAL/PLATELET
BASO%: 0.4 % (ref 0.0–2.0)
Basophils Absolute: 0 10*3/uL (ref 0.0–0.1)
EOS%: 3.3 % (ref 0.0–7.0)
Eosinophils Absolute: 0.2 10*3/uL (ref 0.0–0.5)
HCT: 42.7 % (ref 34.8–46.6)
HGB: 13.9 g/dL (ref 11.6–15.9)
LYMPH%: 17.5 % (ref 14.0–49.7)
MCH: 30.6 pg (ref 25.1–34.0)
MCHC: 32.6 g/dL (ref 31.5–36.0)
MCV: 94.1 fL (ref 79.5–101.0)
MONO#: 0.5 10*3/uL (ref 0.1–0.9)
MONO%: 10.2 % (ref 0.0–14.0)
NEUT#: 3.4 10*3/uL (ref 1.5–6.5)
NEUT%: 68.6 % (ref 38.4–76.8)
Platelets: 137 10*3/uL — ABNORMAL LOW (ref 145–400)
RBC: 4.54 10*6/uL (ref 3.70–5.45)
RDW: 14.3 % (ref 11.2–14.5)
WBC: 4.9 10*3/uL (ref 3.9–10.3)
lymph#: 0.9 10*3/uL (ref 0.9–3.3)

## 2014-09-15 LAB — COMPREHENSIVE METABOLIC PANEL (CC13)
ALBUMIN: 4.1 g/dL (ref 3.5–5.0)
ALK PHOS: 56 U/L (ref 40–150)
ALT: 16 U/L (ref 0–55)
AST: 30 U/L (ref 5–34)
Anion Gap: 11 mEq/L (ref 3–11)
BILIRUBIN TOTAL: 0.64 mg/dL (ref 0.20–1.20)
BUN: 12.9 mg/dL (ref 7.0–26.0)
CO2: 22 mEq/L (ref 22–29)
Calcium: 10 mg/dL (ref 8.4–10.4)
Chloride: 108 mEq/L (ref 98–109)
Creatinine: 0.9 mg/dL (ref 0.6–1.1)
EGFR: 54 mL/min/{1.73_m2} — AB (ref >90)
Glucose: 90 mg/dl (ref 70–140)
POTASSIUM: 4.3 meq/L (ref 3.5–5.1)
Sodium: 142 mEq/L (ref 136–145)
Total Protein: 7 g/dL (ref 6.4–8.3)

## 2014-09-15 MED ORDER — ACETAMINOPHEN 325 MG PO TABS
650.0000 mg | ORAL_TABLET | Freq: Once | ORAL | Status: AC
  Administered 2014-09-15: 650 mg via ORAL

## 2014-09-15 MED ORDER — TRASTUZUMAB CHEMO INJECTION 440 MG
6.0000 mg/kg | Freq: Once | INTRAVENOUS | Status: AC
  Administered 2014-09-15: 294 mg via INTRAVENOUS
  Filled 2014-09-15: qty 14

## 2014-09-15 MED ORDER — SODIUM CHLORIDE 0.9 % IV SOLN
Freq: Once | INTRAVENOUS | Status: AC
  Administered 2014-09-15: 12:00:00 via INTRAVENOUS

## 2014-09-15 MED ORDER — ACETAMINOPHEN 325 MG PO TABS
ORAL_TABLET | ORAL | Status: AC
  Filled 2014-09-15: qty 2

## 2014-09-15 NOTE — Patient Instructions (Addendum)
[facility] Discharge Instructions for Patients Receiving Chemotherapy  Today you received the following chemotherapy agents Herceptin.  To help prevent nausea and vomiting after your treatment, we encourage you to take your nausea medication as prescribed by your physician. If you develop nausea and vomiting that is not controlled by your nausea medication, call the clinic.   BELOW ARE SYMPTOMS THAT SHOULD BE REPORTED IMMEDIATELY:  *FEVER GREATER THAN 100.5 F  *CHILLS WITH OR WITHOUT FEVER  NAUSEA AND VOMITING THAT IS NOT CONTROLLED WITH YOUR NAUSEA MEDICATION  *UNUSUAL SHORTNESS OF BREATH  *UNUSUAL BRUISING OR BLEEDING  TENDERNESS IN MOUTH AND THROAT WITH OR WITHOUT PRESENCE OF ULCERS  *URINARY PROBLEMS  *BOWEL PROBLEMS  UNUSUAL RASH Items with * indicate a potential emergency and should be followed up as soon as possible.  Feel free to call the clinic you have any questions or concerns. The clinic phone number is [phone].  Please show the CHEMO ALERT CARD at check-in to the Emergency Department and triage nurse.   

## 2014-09-21 ENCOUNTER — Other Ambulatory Visit (HOSPITAL_COMMUNITY): Payer: Medicare Other

## 2014-09-29 ENCOUNTER — Ambulatory Visit (HOSPITAL_COMMUNITY)
Admission: RE | Admit: 2014-09-29 | Discharge: 2014-09-29 | Disposition: A | Discharge status: Home / Self Care | Payer: Medicare Other | Source: Ambulatory Visit | Attending: Oncology | Admitting: Oncology

## 2014-09-29 DIAGNOSIS — C50912 Malignant neoplasm of unspecified site of left female breast: Secondary | ICD-10-CM

## 2014-09-29 DIAGNOSIS — I10 Essential (primary) hypertension: Secondary | ICD-10-CM | POA: Insufficient documentation

## 2014-09-29 DIAGNOSIS — I159 Secondary hypertension, unspecified: Secondary | ICD-10-CM

## 2014-09-29 DIAGNOSIS — I2510 Atherosclerotic heart disease of native coronary artery without angina pectoris: Secondary | ICD-10-CM | POA: Insufficient documentation

## 2014-09-29 DIAGNOSIS — I2583 Coronary atherosclerosis due to lipid rich plaque: Secondary | ICD-10-CM | POA: Diagnosis not present

## 2014-09-29 NOTE — Progress Notes (Signed)
  Echocardiogram 2D Echocardiogram has been performed.  [name] 09/29/2014, 12:03 PM

## 2014-09-30 ENCOUNTER — Ambulatory Visit: Payer: Medicare Other | Admitting: Internal Medicine

## 2014-10-13 ENCOUNTER — Ambulatory Visit (HOSPITAL_BASED_OUTPATIENT_CLINIC_OR_DEPARTMENT_OTHER): Payer: Medicare Other

## 2014-10-13 ENCOUNTER — Other Ambulatory Visit (HOSPITAL_BASED_OUTPATIENT_CLINIC_OR_DEPARTMENT_OTHER): Payer: Medicare Other

## 2014-10-13 VITALS — BP 149/72 | HR 60 | Temp 97.6°F | Resp 18

## 2014-10-13 DIAGNOSIS — C50912 Malignant neoplasm of unspecified site of left female breast: Secondary | ICD-10-CM | POA: Diagnosis present

## 2014-10-13 DIAGNOSIS — C773 Secondary and unspecified malignant neoplasm of axilla and upper limb lymph nodes: Secondary | ICD-10-CM

## 2014-10-13 DIAGNOSIS — Z5112 Encounter for antineoplastic immunotherapy: Secondary | ICD-10-CM | POA: Diagnosis present

## 2014-10-13 LAB — CBC WITH DIFFERENTIAL/PLATELET
BASO%: 1 % (ref 0.0–2.0)
Basophils Absolute: 0 10*3/uL (ref 0.0–0.1)
EOS%: 4 % (ref 0.0–7.0)
Eosinophils Absolute: 0.2 10*3/uL (ref 0.0–0.5)
HCT: 39.9 % (ref 34.8–46.6)
HGB: 13 g/dL (ref 11.6–15.9)
LYMPH%: 17.6 % (ref 14.0–49.7)
MCH: 29.5 pg (ref 25.1–34.0)
MCHC: 32.5 g/dL (ref 31.5–36.0)
MCV: 90.8 fL (ref 79.5–101.0)
MONO#: 0.4 10*3/uL (ref 0.1–0.9)
MONO%: 10.5 % (ref 0.0–14.0)
NEUT%: 66.9 % (ref 38.4–76.8)
NEUTROS ABS: 2.9 10*3/uL (ref 1.5–6.5)
PLATELETS: 147 10*3/uL (ref 145–400)
RBC: 4.39 10*6/uL (ref 3.70–5.45)
RDW: 14.7 % — AB (ref 11.2–14.5)
WBC: 4.3 10*3/uL (ref 3.9–10.3)
lymph#: 0.8 10*3/uL — ABNORMAL LOW (ref 0.9–3.3)

## 2014-10-13 LAB — COMPREHENSIVE METABOLIC PANEL (CC13)
ALT: 13 U/L (ref 0–55)
AST: 26 U/L (ref 5–34)
Albumin: 3.8 g/dL (ref 3.5–5.0)
Alkaline Phosphatase: 56 U/L (ref 40–150)
Anion Gap: 10 mEq/L (ref 3–11)
BUN: 17.1 mg/dL (ref 7.0–26.0)
CALCIUM: 9.6 mg/dL (ref 8.4–10.4)
CHLORIDE: 110 meq/L — AB (ref 98–109)
CO2: 21 mEq/L — ABNORMAL LOW (ref 22–29)
Creatinine: 0.9 mg/dL (ref 0.6–1.1)
EGFR: 54 mL/min/{1.73_m2} — ABNORMAL LOW (ref >90)
GLUCOSE: 108 mg/dL (ref 70–140)
Potassium: 3.9 mEq/L (ref 3.5–5.1)
SODIUM: 141 meq/L (ref 136–145)
TOTAL PROTEIN: 6.7 g/dL (ref 6.4–8.3)
Total Bilirubin: 0.6 mg/dL (ref 0.20–1.20)

## 2014-10-13 MED ORDER — TRASTUZUMAB CHEMO INJECTION 440 MG
6.0000 mg/kg | Freq: Once | INTRAVENOUS | Status: AC
  Administered 2014-10-13: 294 mg via INTRAVENOUS
  Filled 2014-10-13: qty 14

## 2014-10-13 MED ORDER — ACETAMINOPHEN 325 MG PO TABS
650.0000 mg | ORAL_TABLET | Freq: Once | ORAL | Status: AC
  Administered 2014-10-13: 650 mg via ORAL

## 2014-10-13 MED ORDER — ACETAMINOPHEN 325 MG PO TABS
ORAL_TABLET | ORAL | Status: AC
  Filled 2014-10-13: qty 2

## 2014-10-13 MED ORDER — SODIUM CHLORIDE 0.9 % IV SOLN
Freq: Once | INTRAVENOUS | Status: AC
  Administered 2014-10-13: 12:00:00 via INTRAVENOUS

## 2014-10-13 NOTE — Patient Instructions (Signed)
[facility] Discharge Instructions for Patients Receiving Chemotherapy  Today you received the following chemotherapy agents Herceptin  To help prevent nausea and vomiting after your treatment, we encourage you to take your nausea medication    If you develop nausea and vomiting that is not controlled by your nausea medication, call the clinic.   BELOW ARE SYMPTOMS THAT SHOULD BE REPORTED IMMEDIATELY:  *FEVER GREATER THAN 100.5 F  *CHILLS WITH OR WITHOUT FEVER  NAUSEA AND VOMITING THAT IS NOT CONTROLLED WITH YOUR NAUSEA MEDICATION  *UNUSUAL SHORTNESS OF BREATH  *UNUSUAL BRUISING OR BLEEDING  TENDERNESS IN MOUTH AND THROAT WITH OR WITHOUT PRESENCE OF ULCERS  *URINARY PROBLEMS  *BOWEL PROBLEMS  UNUSUAL RASH Items with * indicate a potential emergency and should be followed up as soon as possible.  Feel free to call the clinic you have any questions or concerns. The clinic phone number is [phone].  Please show the CHEMO ALERT CARD at check-in to the Emergency Department and triage nurse.   

## 2014-10-14 ENCOUNTER — Encounter (HOSPITAL_COMMUNITY): Payer: Medicare Other

## 2014-11-10 ENCOUNTER — Ambulatory Visit (HOSPITAL_BASED_OUTPATIENT_CLINIC_OR_DEPARTMENT_OTHER): Payer: Medicare Other

## 2014-11-10 ENCOUNTER — Other Ambulatory Visit (HOSPITAL_BASED_OUTPATIENT_CLINIC_OR_DEPARTMENT_OTHER): Payer: Medicare Other

## 2014-11-10 ENCOUNTER — Telehealth: Payer: Self-pay | Admitting: Nurse Practitioner

## 2014-11-10 ENCOUNTER — Ambulatory Visit (HOSPITAL_BASED_OUTPATIENT_CLINIC_OR_DEPARTMENT_OTHER): Payer: Medicare Other | Admitting: Nurse Practitioner

## 2014-11-10 VITALS — BP 154/73 | HR 72 | Temp 97.6°F | Resp 18 | Ht 60.0 in | Wt 102.6 lb

## 2014-11-10 DIAGNOSIS — C50912 Malignant neoplasm of unspecified site of left female breast: Secondary | ICD-10-CM | POA: Diagnosis present

## 2014-11-10 DIAGNOSIS — G4762 Sleep related leg cramps: Secondary | ICD-10-CM

## 2014-11-10 DIAGNOSIS — M810 Age-related osteoporosis without current pathological fracture: Secondary | ICD-10-CM

## 2014-11-10 DIAGNOSIS — Z5112 Encounter for antineoplastic immunotherapy: Secondary | ICD-10-CM | POA: Diagnosis present

## 2014-11-10 DIAGNOSIS — C773 Secondary and unspecified malignant neoplasm of axilla and upper limb lymph nodes: Secondary | ICD-10-CM

## 2014-11-10 DIAGNOSIS — Z5181 Encounter for therapeutic drug level monitoring: Secondary | ICD-10-CM

## 2014-11-10 DIAGNOSIS — C50919 Malignant neoplasm of unspecified site of unspecified female breast: Secondary | ICD-10-CM

## 2014-11-10 DIAGNOSIS — Z79899 Other long term (current) drug therapy: Secondary | ICD-10-CM

## 2014-11-10 LAB — COMPREHENSIVE METABOLIC PANEL (CC13)
ALBUMIN: 4 g/dL (ref 3.5–5.0)
ALK PHOS: 58 U/L (ref 40–150)
ALT: 11 U/L (ref 0–55)
AST: 27 U/L (ref 5–34)
Anion Gap: 6 mEq/L (ref 3–11)
BILIRUBIN TOTAL: 0.47 mg/dL (ref 0.20–1.20)
BUN: 17.2 mg/dL (ref 7.0–26.0)
CO2: 25 mEq/L (ref 22–29)
Calcium: 10.1 mg/dL (ref 8.4–10.4)
Chloride: 109 mEq/L (ref 98–109)
Creatinine: 1 mg/dL (ref 0.6–1.1)
EGFR: 49 mL/min/{1.73_m2} — ABNORMAL LOW (ref >90)
Glucose: 119 mg/dl (ref 70–140)
Potassium: 4 mEq/L (ref 3.5–5.1)
SODIUM: 140 meq/L (ref 136–145)
Total Protein: 6.8 g/dL (ref 6.4–8.3)

## 2014-11-10 LAB — CBC WITH DIFFERENTIAL/PLATELET
BASO%: 0.6 % (ref 0.0–2.0)
BASOS ABS: 0 10*3/uL (ref 0.0–0.1)
EOS%: 3.7 % (ref 0.0–7.0)
Eosinophils Absolute: 0.2 10*3/uL (ref 0.0–0.5)
HCT: 42.8 % (ref 34.8–46.6)
HGB: 13.9 g/dL (ref 11.6–15.9)
LYMPH%: 16.2 % (ref 14.0–49.7)
MCH: 29.7 pg (ref 25.1–34.0)
MCHC: 32.5 g/dL (ref 31.5–36.0)
MCV: 91.4 fL (ref 79.5–101.0)
MONO#: 0.4 10*3/uL (ref 0.1–0.9)
MONO%: 9.8 % (ref 0.0–14.0)
NEUT#: 3.1 10*3/uL (ref 1.5–6.5)
NEUT%: 69.7 % (ref 38.4–76.8)
Platelets: 145 10*3/uL (ref 145–400)
RBC: 4.69 10*6/uL (ref 3.70–5.45)
RDW: 14.4 % (ref 11.2–14.5)
WBC: 4.4 10*3/uL (ref 3.9–10.3)
lymph#: 0.7 10*3/uL — ABNORMAL LOW (ref 0.9–3.3)

## 2014-11-10 MED ORDER — TRASTUZUMAB CHEMO INJECTION 440 MG
6.0000 mg/kg | Freq: Once | INTRAVENOUS | Status: AC
  Administered 2014-11-10: 294 mg via INTRAVENOUS
  Filled 2014-11-10: qty 14

## 2014-11-10 MED ORDER — ACETAMINOPHEN 325 MG PO TABS
650.0000 mg | ORAL_TABLET | Freq: Once | ORAL | Status: AC
  Administered 2014-11-10: 650 mg via ORAL

## 2014-11-10 MED ORDER — SODIUM CHLORIDE 0.9 % IV SOLN
Freq: Once | INTRAVENOUS | Status: AC
  Administered 2014-11-10: 13:00:00 via INTRAVENOUS

## 2014-11-10 MED ORDER — ZOLEDRONIC ACID 4 MG/5ML IV CONC
3.0000 mg | Freq: Once | INTRAVENOUS | Status: AC
  Administered 2014-11-10: 3 mg via INTRAVENOUS
  Filled 2014-11-10: qty 3.75

## 2014-11-10 MED ORDER — ACETAMINOPHEN 325 MG PO TABS
ORAL_TABLET | ORAL | Status: AC
  Filled 2014-11-10: qty 2

## 2014-11-10 NOTE — Progress Notes (Signed)
[place]  [phone] [phone]  OFFICE PROGRESS NOTE   ID: [name]   DOB: [birth year]  MR#: [id number]  VQQ#:[id number]  Cc:  Azalee Course, Glori Bickers  CHIEF COMPLAINT: metastatic breast cancer  CURRENT TREATMENT: trastuzumab every 4 weeks  BREAST CANCER HISTORY: From the original intake note:  Mrs. Rentz's gynecologist, Dr. Josefa Half, palpated a mass in the patient's left breast in 2004. The patient has a daughter, who is a Marine scientist, formerly I believe an oncology nurse, currently a pulmonary nurse in [place], so the patient went there for her care, and while I do not have all the workup, I do have the pathology report from October 01, 2002, which was her left lumpectomy and sentinel lymph node sampling under Dr. Kemper Durie. This showed [phone]) a 2.5 cm infiltrating ductal carcinoma with lobular features (the cells were E-cadherin positive), involving one of three sentinel lymph nodes. The anterior margin was close at less than 1 mm. The patient saw Dr. Arloa Koh here, and the option of completion mastectomy versus radiotherapy alone was discussed. He felt, and I would probably have agreed, that despite the very close margin, which was nevertheless technically negative, proceeding to radiation without "clearing the margin" further would be adequate. Accordingly, the patient did receive a total of 5040 cGy with an additional 1000 cGy boost to the left breast between September 11 and February 24, 2003.   The patient then did well, but in 2007 developed some redness and bumpiness around the left areola. She brought this to Dr. Ermelinda Das attention, and apparently an initial biopsy was negative, as was a mammogram, however, with further development of the change, the patient had a biopsy under anesthesia, and this apparently was positive (I do not have those records). With this information, she proceeded to left mastectomy on February 26, 2006. The pathology there ([id number]) showed dermal involvement by adenocarcinoma, with pagetoid spread and lymphatic space invasion. The closest margin being 1.2 cm from the inferior margin. Everything else being amply negative. There were no discrete masses or lesions identified in the left breast. The patient subsequently underwent left mastectomy.   There were a series of further skin recurrences, first in November of 2008. Initially, these were resected, but later there were too many lesions to resect, in addition to a large left axillary lymph node. This was biopsied in January 2011, showing the tumor now to be HER-2/neu positive. Patient has been on anti-HER-2/neu treatments since January 2011, initially with trastuzumab plus lapatinib. Lapatinib was discontinued in April 2011 secondary to side effects. Continues now to receive trastuzumab on a q. 3 week basis. Also receives a zoledronic acid every 3 months.    INTERVAL HISTORY: [name] returns today for follow up of her metastatic breast cancer, accompanied by her son. She is due for trastuzumab today, which she receives monthly and tolerates well. She is also due for her 12 week dose of zometa. She is doing well today. The interval history is generally unremarkable.   REVIEW OF SYSTEMS: [name] denies fevers, chills, nausea, vomiting, or changes in bowel or bladder habits. She has chronic stress urinary in continence. She is eating and drinking well. She is scheduled for an eye surgery where they will scrape her corneas to improve her vision. She uses hearing aids and has a cane to ambulate. Her last fall was in March. She has sharp pains to her left side along her incision line/rib. A detailed review of systems is otherwise  stable.  PAST MEDICAL HISTORY: Past Medical History  Diagnosis Date  . Cancer     breast  . Cataract   . CAD (coronary artery disease)     cath w/ trivial disease  . Diverticulosis of colon   . Osteoporosis      s/p compression fx T12  . Macular degeneration   . Breast cancer     PAST SURGICAL HISTORY: Past Surgical History  Procedure Laterality Date  . Abdominal hysterectomy    . Breast lumpectomy    . Excision for paget's vulvar    . Vertbroplasty      T12  . Mastectomy      left  . Flexible sigmoidoscopy N/A 06/27/2012    Procedure: FLEXIBLE SIGMOIDOSCOPY;  Surgeon: Lafayette Dragon, MD;  Location: [facility];  Service: Endoscopy;  Laterality: N/A;    FAMILY HISTORY The patient's father died in an automobile accident at 80, the patient's mother from a stroke at 42. The patient had one brother who died from causes unknown to her at the age of 50, and a sister who had "lots of problems" and died at the age of 66, but there is no history of breast or ovarian cancer in the family.   GYNECOLOGIC HISTORY: She is GX P3. She took hormone replacement therapy briefly after menopause.   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 She has been a widow since 1997; lives independently, drives, and does all of her activities of daily living. Her daughter, [name], is a Marine scientist in [place]. Her daughter, [name], lives in [place].[name], who frequently comes with the patient to medical visits, is married to the patient's son, who is also her power of attorney. He works for the [employer]. The patient has six grandchildren and three great-grandchildren. She attends [employer].    ADVANCED DIRECTIVES: In place  HEALTH MAINTENANCE: History  Substance Use Topics  . Smoking status: Former Smoker    Quit date: 05/01/1990  . Smokeless tobacco: Never Used  . Alcohol Use: No     Colonoscopy: 02/06/2002  PAP: Not on file  Bone density: Not on file  Lipid panel: Not on file  Allergies  Allergen Reactions  . Solifenacin Succinate     REACTION: "sick" nausea    Current Outpatient Prescriptions  Medication  Sig Dispense Refill  . acetaminophen (TYLENOL) 500 MG tablet Take 1,000 mg by mouth daily.     . beta carotene w/minerals (OCUVITE) tablet Take 1 tablet by mouth daily.    Marland Kitchen losartan (COZAAR) 25 MG tablet Take 1 tablet (25 mg total) by mouth daily. 90 tablet 3  . ciprofloxacin (CILOXAN) 0.3 % ophthalmic solution Has not started yet    . DUREZOL 0.05 % EMUL Has not started yet     No current facility-administered medications for this visit.    OBJECTIVE: Elderly white woman in no acute distress Filed Vitals:   11/10/14 1115  BP: 154/73  Pulse: 72  Temp: 97.6 F (36.4 C)  Resp: 18     Body mass index is 20.04 kg/(m^2).    ECOG FS: 1  Skin: warm, dry  HEENT: sclerae anicteric, conjunctivae pink, oropharynx clear. No thrush or mucositis.  Lymph Nodes: No cervical or supraclavicular lymphadenopathy  Lungs: clear to auscultation bilaterally, no rales, wheezes, or rhonci  Heart: regular rate and rhythm  Abdomen: round, soft, non tender, positive bowel sounds  Musculoskeletal: No focal spinal tenderness, no peripheral edema, moderate kyphosis Neuro: non focal, well oriented, positive affect  Breasts: left breast status post mastectomy. No evidence of chest wall recurrence. Left axilla benign. Right breast unremarkable.   LAB RESULTS: Lab Results  Component Value Date   WBC 4.4 11/10/2014   NEUTROABS 3.1 11/10/2014   HGB 13.9 11/10/2014   HCT 42.8 11/10/2014   MCV 91.4 11/10/2014   PLT 145 11/10/2014      Chemistry      Component Value Date/Time   NA 140 11/10/2014 1102   NA 141 12/09/2013 1245   K 4.0 11/10/2014 1102   K 4.1 12/09/2013 1245   CL 104 12/09/2013 1245   CL 106 10/15/2012 1136   CO2 25 11/10/2014 1102   CO2 26 12/09/2013 1245   BUN 17.2 11/10/2014 1102   BUN 16 12/09/2013 1245   CREATININE 1.0 11/10/2014 1102   CREATININE 0.84 12/09/2013 1245      Component Value Date/Time   CALCIUM 10.1 11/10/2014 1102   CALCIUM 10.3 12/09/2013 1245   ALKPHOS 58  11/10/2014 1102   ALKPHOS 61 12/09/2013 1245   AST 27 11/10/2014 1102   AST 27 12/09/2013 1245   ALT 11 11/10/2014 1102   ALT 12 12/09/2013 1245   BILITOT 0.47 11/10/2014 1102   BILITOT 0.3 12/09/2013 1245       Lab Results  Component Value Date   LABCA2 17 09/26/2011     STUDIES:  No results found.  ASSESSMENT: [name] is a 81 y.o.  [place], [place] woman:  (1) Status post left lumpectomy and sentinel lymph node dissection in 09/2002 for a T2 N1 (Stage II) invasive ductal carcinoma, triple negative, status post radiation therapy completed in 01/2003.  (2) Local recurrence around the areola 01/2006, leading to mastectomy.  (3) A series of further skin recurrences first noted in 03/2007. Initially these were resected, later with too many lesions to resect; there was also a large left axillary lymph node which was biopsied in 04/2009 showing the tumor now to be HER-2 positive.   (4) Since 04/2009 she has been on anti-HER-2 treatment, initially with trastuzumab plus lapatinib, with a lapatinib discontinued 07/2009 because of side effects.   (5) Recurrence of vulvar Paget's disease, originally diagnosed and resected in 2004 and recurred in 02/2012.  Followed by Dr. Paula Compton and Dr. Marti Sleigh.  (6) Continues to receive trastuzumab every 4 weeks and zoledronic acid every 3 months.  (a)  Echocardiogram 09/29/2014 showed an EF of 55-60%  (7) Nocturnal leg cramping  PLAN: [name] looks and feels well today. I believe the sharp, catching sensation she feels to a left rib is actually more so tied to her mastectomy site and she now understands that these shooting pains are normal. The labs were reviewed in detail and were stable. She will proceed with her next dose of trastuzumab and zometa as planned today. She had an echocardiogram performed last month that showed a well preserved ejection fraction. Her next one will be due in early September.   We  discussed assisted living and she is not interested at this time.  [name] will continue trastuzumab monthly and zometa every 3 months. Her next office visit will be in October. She understands and agrees with this plan. She has been encouraged to call with any issues that might arise before her next visit here .   Laurie Panda, NP  11/10/2014 5:01 PM

## 2014-11-10 NOTE — Telephone Encounter (Signed)
Gave avs & calendar for August through December.

## 2014-11-10 NOTE — Patient Instructions (Signed)
[facility] Discharge Instructions for Patients Receiving Chemotherapy  Today you received the following chemotherapy agents:  Herceptin  To help prevent nausea and vomiting after your treatment, we encourage you to take your nausea medication as ordered per MD.   If you develop nausea and vomiting that is not controlled by your nausea medication, call the clinic.   BELOW ARE SYMPTOMS THAT SHOULD BE REPORTED IMMEDIATELY:  *FEVER GREATER THAN 100.5 F  *CHILLS WITH OR WITHOUT FEVER  NAUSEA AND VOMITING THAT IS NOT CONTROLLED WITH YOUR NAUSEA MEDICATION  *UNUSUAL SHORTNESS OF BREATH  *UNUSUAL BRUISING OR BLEEDING  TENDERNESS IN MOUTH AND THROAT WITH OR WITHOUT PRESENCE OF ULCERS  *URINARY PROBLEMS  *BOWEL PROBLEMS  UNUSUAL RASH Items with * indicate a potential emergency and should be followed up as soon as possible.  Feel free to call the clinic you have any questions or concerns. The clinic phone number is [phone].  Please show the CHEMO ALERT CARD at check-in to the Emergency Department and triage nurse.   

## 2014-11-11 ENCOUNTER — Encounter: Payer: Self-pay | Admitting: Nurse Practitioner

## 2014-12-08 ENCOUNTER — Ambulatory Visit (HOSPITAL_BASED_OUTPATIENT_CLINIC_OR_DEPARTMENT_OTHER): Payer: Medicare Other

## 2014-12-08 ENCOUNTER — Other Ambulatory Visit (HOSPITAL_BASED_OUTPATIENT_CLINIC_OR_DEPARTMENT_OTHER): Payer: Medicare Other

## 2014-12-08 ENCOUNTER — Other Ambulatory Visit: Payer: Self-pay | Admitting: Hematology and Oncology

## 2014-12-08 VITALS — BP 173/70 | HR 53

## 2014-12-08 DIAGNOSIS — C50912 Malignant neoplasm of unspecified site of left female breast: Secondary | ICD-10-CM

## 2014-12-08 DIAGNOSIS — C773 Secondary and unspecified malignant neoplasm of axilla and upper limb lymph nodes: Secondary | ICD-10-CM

## 2014-12-08 DIAGNOSIS — Z5112 Encounter for antineoplastic immunotherapy: Secondary | ICD-10-CM

## 2014-12-08 LAB — COMPREHENSIVE METABOLIC PANEL (CC13)
ALT: 23 U/L (ref 0–55)
AST: 31 U/L (ref 5–34)
Albumin: 4.1 g/dL (ref 3.5–5.0)
Alkaline Phosphatase: 59 U/L (ref 40–150)
Anion Gap: 5 mEq/L (ref 3–11)
BUN: 17.7 mg/dL (ref 7.0–26.0)
CALCIUM: 9.8 mg/dL (ref 8.4–10.4)
CO2: 27 mEq/L (ref 22–29)
CREATININE: 0.9 mg/dL (ref 0.6–1.1)
Chloride: 109 mEq/L (ref 98–109)
EGFR: 57 mL/min/{1.73_m2} — ABNORMAL LOW (ref >90)
Glucose: 90 mg/dl (ref 70–140)
POTASSIUM: 4.1 meq/L (ref 3.5–5.1)
Sodium: 141 mEq/L (ref 136–145)
Total Bilirubin: 0.53 mg/dL (ref 0.20–1.20)
Total Protein: 7 g/dL (ref 6.4–8.3)

## 2014-12-08 LAB — CBC WITH DIFFERENTIAL/PLATELET
BASO%: 0.9 % (ref 0.0–2.0)
BASOS ABS: 0 10*3/uL (ref 0.0–0.1)
EOS%: 4.3 % (ref 0.0–7.0)
Eosinophils Absolute: 0.2 10*3/uL (ref 0.0–0.5)
HEMATOCRIT: 43 % (ref 34.8–46.6)
HEMOGLOBIN: 13.9 g/dL (ref 11.6–15.9)
LYMPH%: 16.7 % (ref 14.0–49.7)
MCH: 29.7 pg (ref 25.1–34.0)
MCHC: 32.3 g/dL (ref 31.5–36.0)
MCV: 91.9 fL (ref 79.5–101.0)
MONO#: 0.4 10*3/uL (ref 0.1–0.9)
MONO%: 10.9 % (ref 0.0–14.0)
NEUT#: 2.7 10*3/uL (ref 1.5–6.5)
NEUT%: 67.2 % (ref 38.4–76.8)
Platelets: 141 10*3/uL — ABNORMAL LOW (ref 145–400)
RBC: 4.67 10*6/uL (ref 3.70–5.45)
RDW: 14.2 % (ref 11.2–14.5)
WBC: 4 10*3/uL (ref 3.9–10.3)
lymph#: 0.7 10*3/uL — ABNORMAL LOW (ref 0.9–3.3)

## 2014-12-08 MED ORDER — SODIUM CHLORIDE 0.9 % IV SOLN
6.0000 mg/kg | Freq: Once | INTRAVENOUS | Status: AC
  Administered 2014-12-08: 294 mg via INTRAVENOUS
  Filled 2014-12-08: qty 14

## 2014-12-08 MED ORDER — SODIUM CHLORIDE 0.9 % IV SOLN
Freq: Once | INTRAVENOUS | Status: AC
  Administered 2014-12-08: 11:00:00 via INTRAVENOUS

## 2014-12-08 MED ORDER — ACETAMINOPHEN 325 MG PO TABS
ORAL_TABLET | ORAL | Status: AC
  Filled 2014-12-08: qty 2

## 2014-12-08 MED ORDER — ACETAMINOPHEN 325 MG PO TABS
650.0000 mg | ORAL_TABLET | Freq: Once | ORAL | Status: AC
  Administered 2014-12-08: 650 mg via ORAL

## 2014-12-08 NOTE — Patient Instructions (Signed)
[facility] Discharge Instructions for Patients Receiving Chemotherapy  Today you received the following chemotherapy agents:  Herceptin  To help prevent nausea and vomiting after your treatment, we encourage you to take your nausea medication as prescribed.   If you develop nausea and vomiting that is not controlled by your nausea medication, call the clinic.   BELOW ARE SYMPTOMS THAT SHOULD BE REPORTED IMMEDIATELY:  *FEVER GREATER THAN 100.5 F  *CHILLS WITH OR WITHOUT FEVER  NAUSEA AND VOMITING THAT IS NOT CONTROLLED WITH YOUR NAUSEA MEDICATION  *UNUSUAL SHORTNESS OF BREATH  *UNUSUAL BRUISING OR BLEEDING  TENDERNESS IN MOUTH AND THROAT WITH OR WITHOUT PRESENCE OF ULCERS  *URINARY PROBLEMS  *BOWEL PROBLEMS  UNUSUAL RASH Items with * indicate a potential emergency and should be followed up as soon as possible.  Feel free to call the clinic you have any questions or concerns. The clinic phone number is [phone].  Please show the CHEMO ALERT CARD at check-in to the Emergency Department and triage nurse.   

## 2015-01-05 ENCOUNTER — Ambulatory Visit (HOSPITAL_BASED_OUTPATIENT_CLINIC_OR_DEPARTMENT_OTHER): Payer: Medicare Other

## 2015-01-05 ENCOUNTER — Other Ambulatory Visit: Payer: Self-pay | Admitting: Oncology

## 2015-01-05 ENCOUNTER — Other Ambulatory Visit (HOSPITAL_BASED_OUTPATIENT_CLINIC_OR_DEPARTMENT_OTHER): Payer: Medicare Other

## 2015-01-05 VITALS — BP 168/74 | HR 59

## 2015-01-05 DIAGNOSIS — C773 Secondary and unspecified malignant neoplasm of axilla and upper limb lymph nodes: Secondary | ICD-10-CM | POA: Diagnosis not present

## 2015-01-05 DIAGNOSIS — C50912 Malignant neoplasm of unspecified site of left female breast: Secondary | ICD-10-CM

## 2015-01-05 DIAGNOSIS — Z5112 Encounter for antineoplastic immunotherapy: Secondary | ICD-10-CM | POA: Diagnosis present

## 2015-01-05 LAB — CBC WITH DIFFERENTIAL/PLATELET
BASO%: 0.6 % (ref 0.0–2.0)
Basophils Absolute: 0 10*3/uL (ref 0.0–0.1)
EOS ABS: 0.2 10*3/uL (ref 0.0–0.5)
EOS%: 3.2 % (ref 0.0–7.0)
HEMATOCRIT: 42 % (ref 34.8–46.6)
HGB: 13.4 g/dL (ref 11.6–15.9)
LYMPH%: 17.6 % (ref 14.0–49.7)
MCH: 29.6 pg (ref 25.1–34.0)
MCHC: 31.9 g/dL (ref 31.5–36.0)
MCV: 92.9 fL (ref 79.5–101.0)
MONO#: 0.5 10*3/uL (ref 0.1–0.9)
MONO%: 9.9 % (ref 0.0–14.0)
NEUT%: 68.7 % (ref 38.4–76.8)
NEUTROS ABS: 3.6 10*3/uL (ref 1.5–6.5)
NRBC: 0 % (ref 0–0)
PLATELETS: 132 10*3/uL — AB (ref 145–400)
RBC: 4.52 10*6/uL (ref 3.70–5.45)
RDW: 14.6 % — AB (ref 11.2–14.5)
WBC: 5.3 10*3/uL (ref 3.9–10.3)
lymph#: 0.9 10*3/uL (ref 0.9–3.3)

## 2015-01-05 LAB — COMPREHENSIVE METABOLIC PANEL (CC13)
ALT: 18 U/L (ref 0–55)
ANION GAP: 9 meq/L (ref 3–11)
AST: 31 U/L (ref 5–34)
Albumin: 4.1 g/dL (ref 3.5–5.0)
Alkaline Phosphatase: 60 U/L (ref 40–150)
BUN: 15.6 mg/dL (ref 7.0–26.0)
CALCIUM: 10.3 mg/dL (ref 8.4–10.4)
CO2: 22 mEq/L (ref 22–29)
CREATININE: 0.8 mg/dL (ref 0.6–1.1)
Chloride: 110 mEq/L — ABNORMAL HIGH (ref 98–109)
EGFR: 58 mL/min/{1.73_m2} — ABNORMAL LOW (ref >90)
Glucose: 85 mg/dl (ref 70–140)
Potassium: 4.2 mEq/L (ref 3.5–5.1)
Sodium: 141 mEq/L (ref 136–145)
Total Bilirubin: 0.67 mg/dL (ref 0.20–1.20)
Total Protein: 7.1 g/dL (ref 6.4–8.3)

## 2015-01-05 MED ORDER — TRASTUZUMAB CHEMO INJECTION 440 MG
6.0000 mg/kg | Freq: Once | INTRAVENOUS | Status: AC
  Administered 2015-01-05: 294 mg via INTRAVENOUS
  Filled 2015-01-05: qty 14

## 2015-01-05 MED ORDER — SODIUM CHLORIDE 0.9 % IV SOLN
Freq: Once | INTRAVENOUS | Status: AC
  Administered 2015-01-05: 12:00:00 via INTRAVENOUS

## 2015-01-05 MED ORDER — ACETAMINOPHEN 325 MG PO TABS
650.0000 mg | ORAL_TABLET | Freq: Once | ORAL | Status: AC
  Administered 2015-01-05: 650 mg via ORAL

## 2015-01-05 MED ORDER — ACETAMINOPHEN 325 MG PO TABS
ORAL_TABLET | ORAL | Status: AC
  Filled 2015-01-05: qty 2

## 2015-01-05 NOTE — Patient Instructions (Signed)
[name] Discharge Instructions for Patients Receiving Chemotherapy  Today you received the following chemotherapy agents Herceptin  To help prevent nausea and vomiting after your treatment, we encourage you to take your nausea medication (none ordered; call MD office if needed.)  If you develop nausea and vomiting that is not controlled by your nausea medication, call the clinic.   BELOW ARE SYMPTOMS THAT SHOULD BE REPORTED IMMEDIATELY:  *FEVER GREATER THAN 100.5 F  *CHILLS WITH OR WITHOUT FEVER  NAUSEA AND VOMITING THAT IS NOT CONTROLLED WITH YOUR NAUSEA MEDICATION  *UNUSUAL SHORTNESS OF BREATH  *UNUSUAL BRUISING OR BLEEDING  TENDERNESS IN MOUTH AND THROAT WITH OR WITHOUT PRESENCE OF ULCERS  *URINARY PROBLEMS  *BOWEL PROBLEMS  UNUSUAL RASH Items with * indicate a potential emergency and should be followed up as soon as possible.  Feel free to call the clinic you have any questions or concerns. The clinic phone number is [phone].  Please show the [facility] at check-in to the Emergency Department and triage nurse.

## 2015-01-06 ENCOUNTER — Other Ambulatory Visit: Payer: Self-pay | Admitting: Oncology

## 2015-01-06 ENCOUNTER — Ambulatory Visit: Payer: Medicare Other | Admitting: Internal Medicine

## 2015-01-07 ENCOUNTER — Ambulatory Visit (HOSPITAL_COMMUNITY)
Admission: RE | Admit: 2015-01-07 | Discharge: 2015-01-07 | Disposition: A | Discharge status: Home / Self Care | Payer: Medicare Other | Source: Ambulatory Visit | Attending: Nurse Practitioner | Admitting: Nurse Practitioner

## 2015-01-07 ENCOUNTER — Other Ambulatory Visit: Payer: Self-pay | Admitting: Nurse Practitioner

## 2015-01-07 DIAGNOSIS — Z5181 Encounter for therapeutic drug level monitoring: Secondary | ICD-10-CM

## 2015-01-07 DIAGNOSIS — Z79899 Other long term (current) drug therapy: Secondary | ICD-10-CM | POA: Diagnosis not present

## 2015-01-07 DIAGNOSIS — C50912 Malignant neoplasm of unspecified site of left female breast: Secondary | ICD-10-CM | POA: Insufficient documentation

## 2015-01-07 DIAGNOSIS — I10 Essential (primary) hypertension: Secondary | ICD-10-CM | POA: Insufficient documentation

## 2015-01-07 NOTE — Progress Notes (Signed)
Echocardiogram 2D Echocardiogram limited has been performed.  Tresa Res 01/07/2015, 12:19 PM

## 2015-01-10 ENCOUNTER — Ambulatory Visit (INDEPENDENT_AMBULATORY_CARE_PROVIDER_SITE_OTHER): Payer: Medicare Other | Admitting: Internal Medicine

## 2015-01-10 ENCOUNTER — Encounter: Payer: Self-pay | Admitting: Internal Medicine

## 2015-01-10 VITALS — BP 146/90 | HR 67 | Temp 97.9°F | Resp 12 | Ht 60.0 in | Wt 101.1 lb

## 2015-01-10 DIAGNOSIS — Z0000 Encounter for general adult medical examination without abnormal findings: Secondary | ICD-10-CM | POA: Diagnosis not present

## 2015-01-10 DIAGNOSIS — Z23 Encounter for immunization: Secondary | ICD-10-CM

## 2015-01-10 NOTE — Progress Notes (Signed)
Pre visit review using our clinic review tool, if applicable. No additional management support is needed unless otherwise documented below in the visit note. 

## 2015-01-10 NOTE — Patient Instructions (Signed)
It was a pleasure to meet you today and I would be happy to see you back in 6 months.  Melatonin is the thing we talked about that is over the counter for sleep. You can use it for about a week and see if it helps.   Health Maintenance Adopting a healthy lifestyle and getting preventive care can go a long way to promote health and wellness. Talk with your health care provider about what schedule of regular examinations is right for you. This is a good chance for you to check in with your provider about disease prevention and staying healthy. In between checkups, there are plenty of things you can do on your own. Experts have done a lot of research about which lifestyle changes and preventive measures are most likely to keep you healthy. Ask your health care provider for more information. WEIGHT AND DIET  Eat a healthy diet  Be sure to include plenty of vegetables, fruits, low-fat dairy products, and lean protein.  Do not eat a lot of foods high in solid fats, added sugars, or salt.  Get regular exercise. This is one of the most important things you can do for your health.  Most adults should exercise for at least 150 minutes each week. The exercise should increase your heart rate and make you sweat (moderate-intensity exercise).  Most adults should also do strengthening exercises at least twice a week. This is in addition to the moderate-intensity exercise.  Maintain a healthy weight  Body mass index (BMI) is a measurement that can be used to identify possible weight problems. It estimates body fat based on height and weight. Your health care provider can help determine your BMI and help you achieve or maintain a healthy weight.  For females 55 years of age and older:   A BMI below 18.5 is considered underweight.  A BMI of 18.5 to 24.9 is normal.  A BMI of 25 to 29.9 is considered overweight.  A BMI of 30 and above is considered obese.  Watch levels of cholesterol and blood  lipids  You should start having your blood tested for lipids and cholesterol at 79 years of age, then have this test every 5 years.  You may need to have your cholesterol levels checked more often if:  Your lipid or cholesterol levels are high.  You are older than 79 years of age.  You are at high risk for heart disease.  CANCER SCREENING   Lung Cancer  Lung cancer screening is recommended for adults 53-52 years old who are at high risk for lung cancer because of a history of smoking.  A yearly low-dose CT scan of the lungs is recommended for people who:  Currently smoke.  Have quit within the past 15 years.  Have at least a 30-pack-year history of smoking. A pack year is smoking an average of one pack of cigarettes a day for 1 year.  Yearly screening should continue until it has been 15 years since you quit.  Yearly screening should stop if you develop a health problem that would prevent you from having lung cancer treatment.  Breast Cancer  Practice breast self-awareness. This means understanding how your breasts normally appear and feel.  It also means doing regular breast self-exams. Let your health care provider know about any changes, no matter how small.  If you are in your 20s or 30s, you should have a clinical breast exam (CBE) by a health care provider every 1-3 years as  part of a regular health exam.  If you are 40 or older, have a CBE every year. Also consider having a breast X-ray (mammogram) every year.  If you have a family history of breast cancer, talk to your health care provider about genetic screening.  If you are at high risk for breast cancer, talk to your health care provider about having an MRI and a mammogram every year.  Breast cancer gene (BRCA) assessment is recommended for women who have family members with BRCA-related cancers. BRCA-related cancers include:  Breast.  Ovarian.  Tubal.  Peritoneal cancers.  Results of the assessment  will determine the need for genetic counseling and BRCA1 and BRCA2 testing. Cervical Cancer Routine pelvic examinations to screen for cervical cancer are no longer recommended for nonpregnant women who are considered low risk for cancer of the pelvic organs (ovaries, uterus, and vagina) and who do not have symptoms. A pelvic examination may be necessary if you have symptoms including those associated with pelvic infections. Ask your health care provider if a screening pelvic exam is right for you.   The Pap test is the screening test for cervical cancer for women who are considered at risk.  If you had a hysterectomy for a problem that was not cancer or a condition that could lead to cancer, then you no longer need Pap tests.  If you are older than 65 years, and you have had normal Pap tests for the past 10 years, you no longer need to have Pap tests.  If you have had past treatment for cervical cancer or a condition that could lead to cancer, you need Pap tests and screening for cancer for at least 20 years after your treatment.  If you no longer get a Pap test, assess your risk factors if they change (such as having a new sexual partner). This can affect whether you should start being screened again.  Some women have medical problems that increase their chance of getting cervical cancer. If this is the case for you, your health care provider may recommend more frequent screening and Pap tests.  The human papillomavirus (HPV) test is another test that may be used for cervical cancer screening. The HPV test looks for the virus that can cause cell changes in the cervix. The cells collected during the Pap test can be tested for HPV.  The HPV test can be used to screen women 6 years of age and older. Getting tested for HPV can extend the interval between normal Pap tests from three to five years.  An HPV test also should be used to screen women of any age who have unclear Pap test results.  After  79 years of age, women should have HPV testing as often as Pap tests.  Colorectal Cancer  This type of cancer can be detected and often prevented.  Routine colorectal cancer screening usually begins at 79 years of age and continues through 79 years of age.  Your health care provider may recommend screening at an earlier age if you have risk factors for colon cancer.  Your health care provider may also recommend using home test kits to check for hidden blood in the stool.  A small camera at the end of a tube can be used to examine your colon directly (sigmoidoscopy or colonoscopy). This is done to check for the earliest forms of colorectal cancer.  Routine screening usually begins at age 46.  Direct examination of the colon should be repeated every 5-10  years through 79 years of age. However, you may need to be screened more often if early forms of precancerous polyps or small growths are found. Skin Cancer  Check your skin from head to toe regularly.  Tell your health care provider about any new moles or changes in moles, especially if there is a change in a mole's shape or color.  Also tell your health care provider if you have a mole that is larger than the size of a pencil eraser.  Always use sunscreen. Apply sunscreen liberally and repeatedly throughout the day.  Protect yourself by wearing long sleeves, pants, a wide-brimmed hat, and sunglasses whenever you are outside. HEART DISEASE, DIABETES, AND HIGH BLOOD PRESSURE   Have your blood pressure checked at least every 1-2 years. High blood pressure causes heart disease and increases the risk of stroke.  If you are between 82 years and 72 years old, ask your health care provider if you should take aspirin to prevent strokes.  Have regular diabetes screenings. This involves taking a blood sample to check your fasting blood sugar level.  If you are at a normal weight and have a low risk for diabetes, have this test once every  three years after 79 years of age.  If you are overweight and have a high risk for diabetes, consider being tested at a younger age or more often. PREVENTING INFECTION  Hepatitis B  If you have a higher risk for hepatitis B, you should be screened for this virus. You are considered at high risk for hepatitis B if:  You were born in a country where hepatitis B is common. Ask your health care provider which countries are considered high risk.  Your parents were born in a high-risk country, and you have not been immunized against hepatitis B (hepatitis B vaccine).  You have HIV or AIDS.  You use needles to inject street drugs.  You live with someone who has hepatitis B.  You have had sex with someone who has hepatitis B.  You get hemodialysis treatment.  You take certain medicines for conditions, including cancer, organ transplantation, and autoimmune conditions. Hepatitis C  Blood testing is recommended for:  Everyone born from [birth year] through 1963.  Anyone with known risk factors for hepatitis C. Sexually transmitted infections (STIs)  You should be screened for sexually transmitted infections (STIs) including gonorrhea and chlamydia if:  You are sexually active and are younger than 79 years of age.  You are older than 79 years of age and your health care provider tells you that you are at risk for this type of infection.  Your sexual activity has changed since you were last screened and you are at an increased risk for chlamydia or gonorrhea. Ask your health care provider if you are at risk.  If you do not have HIV, but are at risk, it may be recommended that you take a prescription medicine daily to prevent HIV infection. This is called pre-exposure prophylaxis (PrEP). You are considered at risk if:  You are sexually active and do not regularly use condoms or know the HIV status of your partner(s).  You take drugs by injection.  You are sexually active with a partner who  has HIV. Talk with your health care provider about whether you are at high risk of being infected with HIV. If you choose to begin PrEP, you should first be tested for HIV. You should then be tested every 3 months for as long as you are  taking PrEP.  PREGNANCY   If you are premenopausal and you may become pregnant, ask your health care provider about preconception counseling.  If you may become pregnant, take 400 to 800 micrograms (mcg) of folic acid every day.  If you want to prevent pregnancy, talk to your health care provider about birth control (contraception). OSTEOPOROSIS AND MENOPAUSE   Osteoporosis is a disease in which the bones lose minerals and strength with aging. This can result in serious bone fractures. Your risk for osteoporosis can be identified using a bone density scan.  If you are 33 years of age or older, or if you are at risk for osteoporosis and fractures, ask your health care provider if you should be screened.  Ask your health care provider whether you should take a calcium or vitamin D supplement to lower your risk for osteoporosis.  Menopause may have certain physical symptoms and risks.  Hormone replacement therapy may reduce some of these symptoms and risks. Talk to your health care provider about whether hormone replacement therapy is right for you.  HOME CARE INSTRUCTIONS   Schedule regular health, dental, and eye exams.  Stay current with your immunizations.   Do not use any tobacco products including cigarettes, chewing tobacco, or electronic cigarettes.  If you are pregnant, do not drink alcohol.  If you are breastfeeding, limit how much and how often you drink alcohol.  Limit alcohol intake to no more than 1 drink per day for nonpregnant women. One drink equals 12 ounces of beer, 5 ounces of wine, or 1 ounces of hard liquor.  Do not use street drugs.  Do not share needles.  Ask your health care provider for help if you need support or  information about quitting drugs.  Tell your health care provider if you often feel depressed.  Tell your health care provider if you have ever been abused or do not feel safe at home. Document Released: 10/30/2010 Document Revised: 08/31/2013 Document Reviewed: 03/18/2013 [facility] Patient Information 2015 [place], [place]. This information is not intended to replace advice given to you by your health care provider. Make sure you discuss any questions you have with your health care provider.

## 2015-01-11 ENCOUNTER — Encounter: Payer: Self-pay | Admitting: Internal Medicine

## 2015-01-11 DIAGNOSIS — Z0000 Encounter for general adult medical examination without abnormal findings: Secondary | ICD-10-CM | POA: Insufficient documentation

## 2015-01-11 NOTE — Assessment & Plan Note (Signed)
Currently on osteoporosis medication, given flu shot at visit. Up to date on immunizations. Has aged out of cancer screening. Still actively undergoing herceptin treatment for breast cancer recurrence.

## 2015-01-11 NOTE — Progress Notes (Signed)
   Subjective:    Patient ID: [name], female    DOB: 03/04/1914, 81 y.o.   MRN: [id number]  HPI Here for medicare wellness, no new complaints. Please see A/P for status and treatment of chronic medical problems.   Diet: heart healthy Physical activity: active Depression/mood screen: negative Hearing: intact to whispered voice Visual acuity: grossly normal, performs annual eye exam  ADLs: capable Fall risk: none Home safety: good Cognitive evaluation: intact to orientation, naming, recall and repetition EOL planning: adv directives discussed and in place  I have personally reviewed and have noted 1. The patient's medical and social history - reviewed today no changes 2. Their use of alcohol, tobacco or illicit drugs 3. Their current medications and supplements 4. The patient's functional ability including ADL's, fall risks, home safety risks and hearing or visual impairment. 5. Diet and physical activities 6. Evidence for depression or mood disorders 7. Care team reviewed and updated (available in snapshot)  Review of Systems  Constitutional: Negative for fever, activity change, appetite change, fatigue and unexpected weight change.  Respiratory: Negative for cough, chest tightness, shortness of breath and wheezing.   Cardiovascular: Negative for chest pain, palpitations and leg swelling.  Gastrointestinal: Negative for nausea, abdominal pain, diarrhea, constipation and abdominal distention.  Musculoskeletal: Positive for arthralgias. Negative for myalgias, back pain and gait problem.  Skin: Negative.   Neurological: Negative.   Psychiatric/Behavioral: Negative.       Objective:   Physical Exam  Constitutional: She is oriented to person, place, and time. She appears well-developed and well-nourished.  Thin  HENT:  Head: Normocephalic and atraumatic.  Eyes: EOM are normal.  Neck: Normal range of motion.  Cardiovascular: Normal rate and regular rhythm.     Pulmonary/Chest: Effort normal and breath sounds normal. No respiratory distress. She has no wheezes.  Abdominal: Soft. Bowel sounds are normal. She exhibits no distension. There is no tenderness. There is no rebound.  Musculoskeletal: She exhibits no edema.  Neurological: She is alert and oriented to person, place, and time.  Skin: Skin is warm and dry.  Psychiatric: She has a normal mood and affect.   Filed Vitals:   01/10/15 1410  BP: 146/90  Pulse: 67  Temp: 97.9 F (36.6 C)  TempSrc: Oral  Resp: 12  Height: 5' (1.524 m)  Weight: 101 lb 1.9 oz (45.868 kg)  SpO2: 93%      Assessment & Plan:  Flu shot given at visit.

## 2015-01-25 ENCOUNTER — Other Ambulatory Visit (HOSPITAL_COMMUNITY): Payer: Self-pay | Admitting: Internal Medicine

## 2015-02-01 ENCOUNTER — Other Ambulatory Visit: Payer: Self-pay | Admitting: *Deleted

## 2015-02-01 DIAGNOSIS — C519 Malignant neoplasm of vulva, unspecified: Secondary | ICD-10-CM

## 2015-02-01 DIAGNOSIS — C4499 Other specified malignant neoplasm of skin, unspecified: Secondary | ICD-10-CM

## 2015-02-01 MED ORDER — TRIAMCINOLONE ACETONIDE 0.1 % EX CREA: 1.0000 "application " | TOPICAL_CREAM | Freq: Two times a day (BID) | CUTANEOUS | Status: DC | PRN

## 2015-02-01 NOTE — Progress Notes (Signed)
Received call from patient stating that she is unable to get the clobetasol cream prescription as it is $80. Called patient's pharmacy and confirmed that the clobetasol ointment is not any cheaper. Per Dr. Denman George, okay to send triamcinolone cream as a substitution. Confirmed with patient's pharmacy that this medication is only $4. Called and notified patient and she is agreeable to pick-up prescription. Reminded patient to call in January or February and schedule f/u appt with Dr. Fermin Schwab as we do not have MD's schedule for spring 2017 yet.

## 2015-02-02 ENCOUNTER — Ambulatory Visit (HOSPITAL_BASED_OUTPATIENT_CLINIC_OR_DEPARTMENT_OTHER): Payer: Medicare Other | Admitting: Nurse Practitioner

## 2015-02-02 ENCOUNTER — Other Ambulatory Visit (HOSPITAL_BASED_OUTPATIENT_CLINIC_OR_DEPARTMENT_OTHER): Payer: Medicare Other

## 2015-02-02 ENCOUNTER — Telehealth: Payer: Self-pay | Admitting: Oncology

## 2015-02-02 ENCOUNTER — Ambulatory Visit (HOSPITAL_BASED_OUTPATIENT_CLINIC_OR_DEPARTMENT_OTHER): Payer: Medicare Other

## 2015-02-02 ENCOUNTER — Encounter: Payer: Self-pay | Admitting: Nurse Practitioner

## 2015-02-02 VITALS — BP 185/84 | HR 59 | Temp 97.8°F | Resp 18 | Ht 60.0 in | Wt 101.9 lb

## 2015-02-02 DIAGNOSIS — Z79899 Other long term (current) drug therapy: Secondary | ICD-10-CM

## 2015-02-02 DIAGNOSIS — C50912 Malignant neoplasm of unspecified site of left female breast: Secondary | ICD-10-CM

## 2015-02-02 DIAGNOSIS — G4762 Sleep related leg cramps: Secondary | ICD-10-CM

## 2015-02-02 DIAGNOSIS — C773 Secondary and unspecified malignant neoplasm of axilla and upper limb lymph nodes: Secondary | ICD-10-CM

## 2015-02-02 DIAGNOSIS — M810 Age-related osteoporosis without current pathological fracture: Secondary | ICD-10-CM | POA: Diagnosis not present

## 2015-02-02 DIAGNOSIS — C50919 Malignant neoplasm of unspecified site of unspecified female breast: Secondary | ICD-10-CM

## 2015-02-02 DIAGNOSIS — Z5112 Encounter for antineoplastic immunotherapy: Secondary | ICD-10-CM | POA: Diagnosis present

## 2015-02-02 DIAGNOSIS — Z171 Estrogen receptor negative status [ER-]: Secondary | ICD-10-CM | POA: Diagnosis not present

## 2015-02-02 DIAGNOSIS — Z5181 Encounter for therapeutic drug level monitoring: Secondary | ICD-10-CM

## 2015-02-02 LAB — CBC WITH DIFFERENTIAL/PLATELET
BASO%: 0.9 % (ref 0.0–2.0)
BASOS ABS: 0 10*3/uL (ref 0.0–0.1)
EOS%: 4.1 % (ref 0.0–7.0)
Eosinophils Absolute: 0.2 10*3/uL (ref 0.0–0.5)
HCT: 44.5 % (ref 34.8–46.6)
HGB: 14.2 g/dL (ref 11.6–15.9)
LYMPH%: 21.1 % (ref 14.0–49.7)
MCH: 29.1 pg (ref 25.1–34.0)
MCHC: 31.9 g/dL (ref 31.5–36.0)
MCV: 91.2 fL (ref 79.5–101.0)
MONO#: 0.4 10*3/uL (ref 0.1–0.9)
MONO%: 9.8 % (ref 0.0–14.0)
NEUT#: 2.5 10*3/uL (ref 1.5–6.5)
NEUT%: 64.1 % (ref 38.4–76.8)
Platelets: 159 10*3/uL (ref 145–400)
RBC: 4.87 10*6/uL (ref 3.70–5.45)
RDW: 14.3 % (ref 11.2–14.5)
WBC: 3.9 10*3/uL (ref 3.9–10.3)
lymph#: 0.8 10*3/uL — ABNORMAL LOW (ref 0.9–3.3)

## 2015-02-02 LAB — COMPREHENSIVE METABOLIC PANEL (CC13)
ALK PHOS: 63 U/L (ref 40–150)
ALT: 23 U/L (ref 0–55)
AST: 35 U/L — AB (ref 5–34)
Albumin: 3.9 g/dL (ref 3.5–5.0)
Anion Gap: 9 mEq/L (ref 3–11)
BUN: 16.9 mg/dL (ref 7.0–26.0)
CHLORIDE: 111 meq/L — AB (ref 98–109)
CO2: 23 mEq/L (ref 22–29)
Calcium: 10.1 mg/dL (ref 8.4–10.4)
Creatinine: 0.8 mg/dL (ref 0.6–1.1)
EGFR: 61 mL/min/{1.73_m2} — AB (ref >90)
Glucose: 86 mg/dl (ref 70–140)
POTASSIUM: 4.3 meq/L (ref 3.5–5.1)
SODIUM: 142 meq/L (ref 136–145)
Total Bilirubin: 0.35 mg/dL (ref 0.20–1.20)
Total Protein: 7.2 g/dL (ref 6.4–8.3)

## 2015-02-02 MED ORDER — SODIUM CHLORIDE 0.9 % IV SOLN
Freq: Once | INTRAVENOUS | Status: AC
  Administered 2015-02-02: 13:00:00 via INTRAVENOUS

## 2015-02-02 MED ORDER — ACETAMINOPHEN 325 MG PO TABS
ORAL_TABLET | ORAL | Status: AC
  Filled 2015-02-02: qty 2

## 2015-02-02 MED ORDER — TRASTUZUMAB CHEMO INJECTION 440 MG
6.0000 mg/kg | Freq: Once | INTRAVENOUS | Status: AC
  Administered 2015-02-02: 294 mg via INTRAVENOUS
  Filled 2015-02-02: qty 14

## 2015-02-02 MED ORDER — ZOLEDRONIC ACID 4 MG/5ML IV CONC
3.0000 mg | Freq: Once | INTRAVENOUS | Status: AC
  Administered 2015-02-02: 3 mg via INTRAVENOUS
  Filled 2015-02-02: qty 3.75

## 2015-02-02 MED ORDER — ACETAMINOPHEN 325 MG PO TABS
650.0000 mg | ORAL_TABLET | Freq: Once | ORAL | Status: AC
  Administered 2015-02-02: 650 mg via ORAL

## 2015-02-02 NOTE — Patient Instructions (Signed)
[facility] Discharge Instructions for Patients Receiving Chemotherapy  Today you received the following chemotherapy agents Herceptin  To help prevent nausea and vomiting after your treatment, we encourage you to take your nausea medication (none ordered; call MD office if needed.)  If you develop nausea and vomiting that is not controlled by your nausea medication, call the clinic.   BELOW ARE SYMPTOMS THAT SHOULD BE REPORTED IMMEDIATELY:  *FEVER GREATER THAN 100.5 F  *CHILLS WITH OR WITHOUT FEVER  NAUSEA AND VOMITING THAT IS NOT CONTROLLED WITH YOUR NAUSEA MEDICATION  *UNUSUAL SHORTNESS OF BREATH  *UNUSUAL BRUISING OR BLEEDING  TENDERNESS IN MOUTH AND THROAT WITH OR WITHOUT PRESENCE OF ULCERS  *URINARY PROBLEMS  *BOWEL PROBLEMS  UNUSUAL RASH Items with * indicate a potential emergency and should be followed up as soon as possible.  Feel free to call the clinic you have any questions or concerns. The clinic phone number is [phone].  Please show the [facility] at check-in to the Emergency Department and triage nurse.

## 2015-02-02 NOTE — Progress Notes (Signed)
[name]  [phone] [phone]  OFFICE PROGRESS NOTE   ID: [name]   DOB: Jul 22, 1913  MR#: [id number]  YOM#:[id number]  Cc:  [name], [name]  CHIEF COMPLAINT: metastatic breast cancer  CURRENT TREATMENT: trastuzumab every 4 weeks  BREAST CANCER HISTORY: From the original intake note:  [name]'s gynecologist, Dr. Josefa [name], palpated a mass in the patient's left breast in 2004. The patient has a daughter, who is a Marine scientist, formerly I believe an oncology nurse, currently a pulmonary nurse in [place], so the patient went there for her care, and while I do not have all the workup, I do have the pathology report from October 01, 2002, which was her left lumpectomy and sentinel lymph node sampling under Dr. Kemper [name]. This showed [phone]) a 2.5 cm infiltrating ductal carcinoma with lobular features (the cells were E-cadherin positive), involving one of three sentinel lymph nodes. The anterior margin was close at less than 1 mm. The patient saw Dr. Arloa [name] here, and the option of completion mastectomy versus radiotherapy alone was discussed. He felt, and I would probably have agreed, that despite the very close margin, which was nevertheless technically negative, proceeding to radiation without "clearing the margin" further would be adequate. Accordingly, the patient did receive a total of 5040 cGy with an additional 1000 cGy boost to the left breast between September 11 and February 24, 2003.   The patient then did well, but in 2007 developed some redness and bumpiness around the left areola. She brought this to Dr. Ermelinda [name] attention, and apparently an initial biopsy was negative, as was a mammogram, however, with further development of the change, the patient had a biopsy under anesthesia, and this apparently was positive (I do not have those records). With this information, she proceeded to left mastectomy on February 26, 2006. The pathology there ([id number]) showed dermal involvement by adenocarcinoma, with pagetoid spread and lymphatic space invasion. The closest margin being 1.2 cm from the inferior margin. Everything else being amply negative. There were no discrete masses or lesions identified in the left breast. The patient subsequently underwent left mastectomy.   There were a series of further skin recurrences, first in November of 2008. Initially, these were resected, but later there were too many lesions to resect, in addition to a large left axillary lymph node. This was biopsied in January 2011, showing the tumor now to be HER-2/neu positive. Patient has been on anti-HER-2/neu treatments since January 2011, initially with trastuzumab plus lapatinib. Lapatinib was discontinued in April 2011 secondary to side effects. Continues now to receive trastuzumab on a q. 3 week basis. Also receives a zoledronic acid every 3 months.    INTERVAL HISTORY: [name] returns today for follow up of her metastatic breast cancer, accompanied by her son. She is due for trastuzumab today, which she receives monthly and tolerates well. She is also due for her 12 week dose of zometa. The interval history is generally unremarkable. Her blood pressure is elevated today, and she is obviously agitated, but not clear on why. Her memory is intact and she is able to answer questions on her own.  REVIEW OF SYSTEMS: [name] denies fevers, chills, nausea, or vomiting. She has chronic urinary stress in continence but is moving her bowels well. Her appetite is healthy and she is maintaining her weight. She denies shortness of breath, cough, or palpitations. She has left chest wall pain, but this is going to be related to her mastectomy.  She ambulates with a cane and has not fallen recently. She denies headaches or dizziness, but has vision and hearing loss. A detailed review of systems is otherwise stable.  PAST MEDICAL HISTORY: Past  Medical History  Diagnosis Date  . Cancer     breast  . Cataract   . CAD (coronary artery disease)     cath w/ trivial disease  . Diverticulosis of colon   . Osteoporosis     s/p compression fx T12  . Macular degeneration   . Breast cancer     PAST SURGICAL HISTORY: Past Surgical History  Procedure Laterality Date  . Abdominal hysterectomy    . Breast lumpectomy    . Excision for paget's vulvar    . Vertbroplasty      T12  . Mastectomy      left  . Flexible sigmoidoscopy N/A 06/27/2012    Procedure: FLEXIBLE SIGMOIDOSCOPY;  Surgeon: Lafayette Dragon, MD;  Location: [facility];  Service: Endoscopy;  Laterality: N/A;    FAMILY HISTORY The patient's father died in an automobile accident at 54, the patient's mother from a stroke at 56. The patient had one brother who died from causes unknown to her at the age of 52, and a sister who had "lots of problems" and died at the age of 73, but there is no history of breast or ovarian cancer in the family.   GYNECOLOGIC HISTORY: She is GX P3. She took hormone replacement therapy briefly after menopause.   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 She has been a widow since 1997; lives independently, drives, and does all of her activities of daily living. Her daughter, [name], is a Marine scientist in [place]. Her daughter, [name], lives in [place].[name], who frequently comes with the patient to medical visits, is married to the patient's son, who is also her power of attorney. He works for the [employer]. The patient has six grandchildren and three great-grandchildren. She attends [employer].    ADVANCED DIRECTIVES: In place  HEALTH MAINTENANCE: Social History  Substance Use Topics  . Smoking status: Former Smoker    Quit date: 05/01/1990  . Smokeless tobacco: Never Used  . Alcohol Use: No     Colonoscopy: 02/06/2002  PAP: Not on  file  Bone density: Not on file  Lipid panel: Not on file  Allergies  Allergen Reactions  . Solifenacin Succinate     REACTION: "sick" nausea    Current Outpatient Prescriptions  Medication Sig Dispense Refill  . acetaminophen (TYLENOL) 500 MG tablet Take 1,000 mg by mouth daily.     Marland Kitchen losartan (COZAAR) 25 MG tablet TAKE ONE TABLET BY MOUTH ONCE DAILY 90 tablet 0  . Multiple Vitamins-Minerals (PRESERVISION AREDS 2 PO) Take 1 tablet by mouth 2 (two) times daily.    Marland Kitchen triamcinolone cream (KENALOG) 0.1 % Apply 1 application topically 2 (two) times daily as needed. 80 g 1   No current facility-administered medications for this visit.    OBJECTIVE: Elderly white woman in no acute distress Filed Vitals:   02/02/15 1120  BP: 185/84  Pulse: 59  Temp: 97.8 F (36.6 C)  Resp: 18     Body mass index is 19.9 kg/(m^2).    ECOG FS: 1  Skin: warm, dry  HEENT: sclerae anicteric, conjunctivae pink, oropharynx clear. No thrush or mucositis.  Lymph Nodes: No cervical or supraclavicular lymphadenopathy  Lungs:  clear to auscultation bilaterally, no rales, wheezes, or rhonci  Heart: regular rate and rhythm  Abdomen: round, soft, non tender, positive bowel sounds  Musculoskeletal:  no peripheral edema, moderate kyphosis Neuro: non focal, well oriented, angry affect  Breasts: left breast status post mastectomy. No evidence of chest wall recurrence. Left axilla benign. Right breast unremarkable.   LAB RESULTS: Lab Results  Component Value Date   WBC 3.9 02/02/2015   NEUTROABS 2.5 02/02/2015   HGB 14.2 02/02/2015   HCT 44.5 02/02/2015   MCV 91.2 02/02/2015   PLT 159 02/02/2015      Chemistry      Component Value Date/Time   NA 142 02/02/2015 1051   NA 141 12/09/2013 1245   K 4.3 02/02/2015 1051   K 4.1 12/09/2013 1245   CL 104 12/09/2013 1245   CL 106 10/15/2012 1136   CO2 23 02/02/2015 1051   CO2 26 12/09/2013 1245   BUN 16.9 02/02/2015 1051   BUN 16 12/09/2013 1245    CREATININE 0.8 02/02/2015 1051   CREATININE 0.84 12/09/2013 1245      Component Value Date/Time   CALCIUM 10.1 02/02/2015 1051   CALCIUM 10.3 12/09/2013 1245   ALKPHOS 63 02/02/2015 1051   ALKPHOS 61 12/09/2013 1245   AST 35* 02/02/2015 1051   AST 27 12/09/2013 1245   ALT 23 02/02/2015 1051   ALT 12 12/09/2013 1245   BILITOT 0.35 02/02/2015 1051   BILITOT 0.3 12/09/2013 1245       Lab Results  Component Value Date   LABCA2 17 09/26/2011     STUDIES:  No results found.  ASSESSMENT: Ms. Nine is a 81 y.o.  [place], [place] woman:  (1) Status post left lumpectomy and sentinel lymph node dissection in 09/2002 for a T2 N1 (Stage II) invasive ductal carcinoma, triple negative, status post radiation therapy completed in 01/2003.  (2) Local recurrence around the areola 01/2006, leading to mastectomy.  (3) A series of further skin recurrences first noted in 03/2007. Initially these were resected, later with too many lesions to resect; there was also a large left axillary lymph node which was biopsied in 04/2009 showing the tumor now to be HER-2 positive.   (4) Since 04/2009 she has been on anti-HER-2 treatment, initially with trastuzumab plus lapatinib, with a lapatinib discontinued 07/2009 because of side effects.   (5) Recurrence of vulvar Paget's disease, originally diagnosed and resected in 2004 and recurred in 02/2012.  Followed by Dr. Paula Compton and Dr. Marti Sleigh.  (6) Continues to receive trastuzumab every 4 weeks and zoledronic acid every 3 months.  (a)  Echocardiogram 09/29/2014 showed an EF of 55-60%  (7) Nocturnal leg cramping  PLAN: Gusta is in a "terrible mood" today, but is otherwise performing well. The labs were reviewed in detail and were stable. Her most recent echocardiogram showed a well preserved ejection fraction. She will proceed with trastuzumab as planned, as well as the zometa also due today.  I have placed an order for  a repeat echocardiogram in December.  Latorie will continue trastuzumab monthly and zometa every 3 months. Her next office visit will be in January with Dr. Jana Hakim. She understands and agrees with this plan. She has been encouraged to call with any issues that might arise before her next visit here .   Laurie Panda, NP  02/02/2015 12:39 PM

## 2015-02-02 NOTE — Telephone Encounter (Signed)
no vm....mailed pt appt sched

## 2015-03-01 ENCOUNTER — Other Ambulatory Visit: Payer: Self-pay | Admitting: Neurology

## 2015-03-02 ENCOUNTER — Ambulatory Visit (HOSPITAL_BASED_OUTPATIENT_CLINIC_OR_DEPARTMENT_OTHER): Payer: Medicare Other

## 2015-03-02 ENCOUNTER — Other Ambulatory Visit (HOSPITAL_BASED_OUTPATIENT_CLINIC_OR_DEPARTMENT_OTHER): Payer: Medicare Other

## 2015-03-02 VITALS — BP 171/85 | HR 67 | Temp 96.8°F | Resp 16

## 2015-03-02 DIAGNOSIS — Z5112 Encounter for antineoplastic immunotherapy: Secondary | ICD-10-CM

## 2015-03-02 DIAGNOSIS — C50912 Malignant neoplasm of unspecified site of left female breast: Secondary | ICD-10-CM

## 2015-03-02 DIAGNOSIS — C773 Secondary and unspecified malignant neoplasm of axilla and upper limb lymph nodes: Secondary | ICD-10-CM

## 2015-03-02 DIAGNOSIS — C50919 Malignant neoplasm of unspecified site of unspecified female breast: Secondary | ICD-10-CM

## 2015-03-02 LAB — BASIC METABOLIC PANEL (CC13)
Anion Gap: 6 mEq/L (ref 3–11)
BUN: 11.2 mg/dL (ref 7.0–26.0)
CHLORIDE: 109 meq/L (ref 98–109)
CO2: 26 meq/L (ref 22–29)
CREATININE: 0.7 mg/dL (ref 0.6–1.1)
Calcium: 9.9 mg/dL (ref 8.4–10.4)
EGFR: 67 mL/min/{1.73_m2} — ABNORMAL LOW (ref >90)
GLUCOSE: 88 mg/dL (ref 70–140)
Potassium: 4.2 mEq/L (ref 3.5–5.1)
Sodium: 141 mEq/L (ref 136–145)

## 2015-03-02 MED ORDER — ACETAMINOPHEN 325 MG PO TABS
650.0000 mg | ORAL_TABLET | Freq: Once | ORAL | Status: AC
  Administered 2015-03-02: 650 mg via ORAL

## 2015-03-02 MED ORDER — SODIUM CHLORIDE 0.9 % IV SOLN
Freq: Once | INTRAVENOUS | Status: AC
  Administered 2015-03-02: 12:00:00 via INTRAVENOUS

## 2015-03-02 MED ORDER — TRASTUZUMAB CHEMO INJECTION 440 MG
6.0000 mg/kg | Freq: Once | INTRAVENOUS | Status: AC
  Administered 2015-03-02: 294 mg via INTRAVENOUS
  Filled 2015-03-02: qty 14

## 2015-03-02 MED ORDER — ACETAMINOPHEN 325 MG PO TABS
ORAL_TABLET | ORAL | Status: AC
  Filled 2015-03-02: qty 2

## 2015-03-02 NOTE — Patient Instructions (Signed)
[facility] Discharge Instructions for Patients Receiving Chemotherapy  Today you received the following chemotherapy agents herceptin  To help prevent nausea and vomiting after your treatment, we encourage you to take your nausea medication  If you develop nausea and vomiting that is not controlled by your nausea medication, call the clinic.   BELOW ARE SYMPTOMS THAT SHOULD BE REPORTED IMMEDIATELY:  *FEVER GREATER THAN 100.5 F  *CHILLS WITH OR WITHOUT FEVER  NAUSEA AND VOMITING THAT IS NOT CONTROLLED WITH YOUR NAUSEA MEDICATION  *UNUSUAL SHORTNESS OF BREATH  *UNUSUAL BRUISING OR BLEEDING  TENDERNESS IN MOUTH AND THROAT WITH OR WITHOUT PRESENCE OF ULCERS  *URINARY PROBLEMS  *BOWEL PROBLEMS  UNUSUAL RASH Items with * indicate a potential emergency and should be followed up as soon as possible.  Feel free to call the clinic you have any questions or concerns. The clinic phone number is [phone].  Please show the CHEMO ALERT CARD at check-in to the Emergency Department and triage nurse.   

## 2015-03-02 NOTE — Progress Notes (Signed)
BP upon arrival of 183/93. Pt states she took her cozaar this morning. Dr. Jana Hakim notified, requested that we repeat BP in 5 minutes. BP decreased to 171/85. Dr. Jana Hakim notified, per MD okay to treat with current BP and no further interventions ordered.

## 2015-03-29 ENCOUNTER — Other Ambulatory Visit: Payer: Self-pay

## 2015-03-29 DIAGNOSIS — C50912 Malignant neoplasm of unspecified site of left female breast: Secondary | ICD-10-CM

## 2015-03-30 ENCOUNTER — Ambulatory Visit (HOSPITAL_BASED_OUTPATIENT_CLINIC_OR_DEPARTMENT_OTHER): Payer: Medicare Other

## 2015-03-30 ENCOUNTER — Other Ambulatory Visit (HOSPITAL_BASED_OUTPATIENT_CLINIC_OR_DEPARTMENT_OTHER): Payer: Medicare Other

## 2015-03-30 VITALS — BP 181/83 | HR 67 | Resp 18

## 2015-03-30 DIAGNOSIS — C773 Secondary and unspecified malignant neoplasm of axilla and upper limb lymph nodes: Secondary | ICD-10-CM | POA: Diagnosis not present

## 2015-03-30 DIAGNOSIS — Z5112 Encounter for antineoplastic immunotherapy: Secondary | ICD-10-CM | POA: Diagnosis present

## 2015-03-30 DIAGNOSIS — C50912 Malignant neoplasm of unspecified site of left female breast: Secondary | ICD-10-CM | POA: Diagnosis present

## 2015-03-30 LAB — COMPREHENSIVE METABOLIC PANEL (CC13)
ALBUMIN: 3.9 g/dL (ref 3.5–5.0)
ALK PHOS: 60 U/L (ref 40–150)
ALT: 18 U/L (ref 0–55)
AST: 31 U/L (ref 5–34)
Anion Gap: 7 mEq/L (ref 3–11)
BUN: 12.6 mg/dL (ref 7.0–26.0)
CALCIUM: 10.2 mg/dL (ref 8.4–10.4)
CHLORIDE: 109 meq/L (ref 98–109)
CO2: 26 mEq/L (ref 22–29)
Creatinine: 0.9 mg/dL (ref 0.6–1.1)
EGFR: 54 mL/min/{1.73_m2} — AB (ref >90)
GLUCOSE: 130 mg/dL (ref 70–140)
POTASSIUM: 4.4 meq/L (ref 3.5–5.1)
SODIUM: 142 meq/L (ref 136–145)
Total Bilirubin: 0.57 mg/dL (ref 0.20–1.20)
Total Protein: 7.2 g/dL (ref 6.4–8.3)

## 2015-03-30 LAB — CBC WITH DIFFERENTIAL/PLATELET
BASO%: 0.4 % (ref 0.0–2.0)
BASOS ABS: 0 10*3/uL (ref 0.0–0.1)
EOS ABS: 0.2 10*3/uL (ref 0.0–0.5)
EOS%: 4.4 % (ref 0.0–7.0)
HCT: 42.4 % (ref 34.8–46.6)
HEMOGLOBIN: 13.6 g/dL (ref 11.6–15.9)
LYMPH%: 17.2 % (ref 14.0–49.7)
MCH: 29.5 pg (ref 25.1–34.0)
MCHC: 32.1 g/dL (ref 31.5–36.0)
MCV: 92 fL (ref 79.5–101.0)
MONO#: 0.4 10*3/uL (ref 0.1–0.9)
MONO%: 9.5 % (ref 0.0–14.0)
NEUT#: 3.1 10*3/uL (ref 1.5–6.5)
NEUT%: 68.5 % (ref 38.4–76.8)
Platelets: 153 10*3/uL (ref 145–400)
RBC: 4.61 10*6/uL (ref 3.70–5.45)
RDW: 14.8 % — AB (ref 11.2–14.5)
WBC: 4.5 10*3/uL (ref 3.9–10.3)
lymph#: 0.8 10*3/uL — ABNORMAL LOW (ref 0.9–3.3)

## 2015-03-30 MED ORDER — ACETAMINOPHEN 325 MG PO TABS
ORAL_TABLET | ORAL | Status: AC
  Filled 2015-03-30: qty 2

## 2015-03-30 MED ORDER — ACETAMINOPHEN 325 MG PO TABS
650.0000 mg | ORAL_TABLET | Freq: Once | ORAL | Status: AC
  Administered 2015-03-30: 650 mg via ORAL

## 2015-03-30 MED ORDER — SODIUM CHLORIDE 0.9 % IV SOLN
Freq: Once | INTRAVENOUS | Status: AC
  Administered 2015-03-30: 13:00:00 via INTRAVENOUS

## 2015-03-30 MED ORDER — TRASTUZUMAB CHEMO INJECTION 440 MG
6.0000 mg/kg | Freq: Once | INTRAVENOUS | Status: AC
  Administered 2015-03-30: 294 mg via INTRAVENOUS
  Filled 2015-03-30: qty 14

## 2015-03-30 NOTE — Patient Instructions (Signed)
[facility] Discharge Instructions for Patients Receiving Chemotherapy  Today you received the following chemotherapy agents:  Herceptin  To help prevent nausea and vomiting after your treatment, we encourage you to take your nausea medication as prescribed.   If you develop nausea and vomiting that is not controlled by your nausea medication, call the clinic.   BELOW ARE SYMPTOMS THAT SHOULD BE REPORTED IMMEDIATELY:  *FEVER GREATER THAN 100.5 F  *CHILLS WITH OR WITHOUT FEVER  NAUSEA AND VOMITING THAT IS NOT CONTROLLED WITH YOUR NAUSEA MEDICATION  *UNUSUAL SHORTNESS OF BREATH  *UNUSUAL BRUISING OR BLEEDING  TENDERNESS IN MOUTH AND THROAT WITH OR WITHOUT PRESENCE OF ULCERS  *URINARY PROBLEMS  *BOWEL PROBLEMS  UNUSUAL RASH Items with * indicate a potential emergency and should be followed up as soon as possible.  Feel free to call the clinic you have any questions or concerns. The clinic phone number is [phone].  Please show the CHEMO ALERT CARD at check-in to the Emergency Department and triage nurse.   

## 2015-04-15 ENCOUNTER — Telehealth: Payer: Self-pay | Admitting: Oncology

## 2015-04-15 NOTE — Telephone Encounter (Signed)
S/w pt, advised appt chg on 1/11 from MD to NP per MD due to [facility]. Pt says she is "not impressed" with Nira Conn and wants to see Dr. Jana Hakim. I advised pt her appt would need to be moved to another day if she insists on seeing md and md would have to apprvoe the appt move due to her infusion appt scheuled that day. Pt says she will see Nira Conn and keep appt on 05/11/15 at 10.30am.

## 2015-04-21 ENCOUNTER — Other Ambulatory Visit: Payer: Self-pay | Admitting: Oncology

## 2015-04-26 ENCOUNTER — Other Ambulatory Visit: Payer: Self-pay | Admitting: *Deleted

## 2015-04-26 DIAGNOSIS — C50912 Malignant neoplasm of unspecified site of left female breast: Secondary | ICD-10-CM

## 2015-04-27 ENCOUNTER — Other Ambulatory Visit: Payer: Self-pay | Admitting: Nurse Practitioner

## 2015-04-27 ENCOUNTER — Ambulatory Visit (HOSPITAL_BASED_OUTPATIENT_CLINIC_OR_DEPARTMENT_OTHER): Payer: Medicare Other

## 2015-04-27 ENCOUNTER — Other Ambulatory Visit (HOSPITAL_COMMUNITY): Payer: Self-pay | Admitting: Internal Medicine

## 2015-04-27 ENCOUNTER — Ambulatory Visit (HOSPITAL_COMMUNITY)
Admission: RE | Admit: 2015-04-27 | Discharge: 2015-04-27 | Disposition: A | Discharge status: Home / Self Care | Payer: Medicare Other | Source: Ambulatory Visit | Attending: Nurse Practitioner | Admitting: Nurse Practitioner

## 2015-04-27 ENCOUNTER — Other Ambulatory Visit (HOSPITAL_BASED_OUTPATIENT_CLINIC_OR_DEPARTMENT_OTHER): Payer: Medicare Other

## 2015-04-27 ENCOUNTER — Other Ambulatory Visit: Payer: Self-pay | Admitting: Hematology and Oncology

## 2015-04-27 VITALS — BP 155/69 | HR 58 | Temp 97.4°F | Resp 16

## 2015-04-27 DIAGNOSIS — C773 Secondary and unspecified malignant neoplasm of axilla and upper limb lymph nodes: Secondary | ICD-10-CM | POA: Diagnosis not present

## 2015-04-27 DIAGNOSIS — Z87891 Personal history of nicotine dependence: Secondary | ICD-10-CM | POA: Insufficient documentation

## 2015-04-27 DIAGNOSIS — Z79899 Other long term (current) drug therapy: Secondary | ICD-10-CM

## 2015-04-27 DIAGNOSIS — I10 Essential (primary) hypertension: Secondary | ICD-10-CM | POA: Diagnosis not present

## 2015-04-27 DIAGNOSIS — Z5181 Encounter for therapeutic drug level monitoring: Secondary | ICD-10-CM

## 2015-04-27 DIAGNOSIS — Z5112 Encounter for antineoplastic immunotherapy: Secondary | ICD-10-CM | POA: Diagnosis present

## 2015-04-27 DIAGNOSIS — C50912 Malignant neoplasm of unspecified site of left female breast: Secondary | ICD-10-CM | POA: Diagnosis present

## 2015-04-27 LAB — CBC WITH DIFFERENTIAL/PLATELET
BASO%: 1.4 % (ref 0.0–2.0)
BASOS ABS: 0.1 10*3/uL (ref 0.0–0.1)
EOS ABS: 0.2 10*3/uL (ref 0.0–0.5)
EOS%: 4 % (ref 0.0–7.0)
HCT: 42 % (ref 34.8–46.6)
HGB: 13.4 g/dL (ref 11.6–15.9)
LYMPH%: 18.6 % (ref 14.0–49.7)
MCH: 28.9 pg (ref 25.1–34.0)
MCHC: 32 g/dL (ref 31.5–36.0)
MCV: 90.2 fL (ref 79.5–101.0)
MONO#: 0.4 10*3/uL (ref 0.1–0.9)
MONO%: 11.4 % (ref 0.0–14.0)
NEUT%: 64.6 % (ref 38.4–76.8)
NEUTROS ABS: 2.5 10*3/uL (ref 1.5–6.5)
Platelets: 148 10*3/uL (ref 145–400)
RBC: 4.66 10*6/uL (ref 3.70–5.45)
RDW: 14.7 % — ABNORMAL HIGH (ref 11.2–14.5)
WBC: 3.9 10*3/uL (ref 3.9–10.3)
lymph#: 0.7 10*3/uL — ABNORMAL LOW (ref 0.9–3.3)

## 2015-04-27 LAB — COMPREHENSIVE METABOLIC PANEL
ALT: 15 U/L (ref 0–55)
AST: 29 U/L (ref 5–34)
Albumin: 4 g/dL (ref 3.5–5.0)
Alkaline Phosphatase: 61 U/L (ref 40–150)
Anion Gap: 9 mEq/L (ref 3–11)
BUN: 15.5 mg/dL (ref 7.0–26.0)
CO2: 21 meq/L — AB (ref 22–29)
Calcium: 9.9 mg/dL (ref 8.4–10.4)
Chloride: 111 mEq/L — ABNORMAL HIGH (ref 98–109)
Creatinine: 0.9 mg/dL (ref 0.6–1.1)
EGFR: 57 mL/min/{1.73_m2} — AB (ref >90)
GLUCOSE: 91 mg/dL (ref 70–140)
POTASSIUM: 4.3 meq/L (ref 3.5–5.1)
SODIUM: 141 meq/L (ref 136–145)
Total Bilirubin: 0.64 mg/dL (ref 0.20–1.20)
Total Protein: 7 g/dL (ref 6.4–8.3)

## 2015-04-27 MED ORDER — TRASTUZUMAB CHEMO INJECTION 440 MG
6.0000 mg/kg | Freq: Once | INTRAVENOUS | Status: AC
  Administered 2015-04-27: 294 mg via INTRAVENOUS
  Filled 2015-04-27: qty 14

## 2015-04-27 MED ORDER — ACETAMINOPHEN 325 MG PO TABS
650.0000 mg | ORAL_TABLET | Freq: Once | ORAL | Status: AC
  Administered 2015-04-27: 650 mg via ORAL

## 2015-04-27 MED ORDER — SODIUM CHLORIDE 0.9 % IV SOLN
Freq: Once | INTRAVENOUS | Status: AC
  Administered 2015-04-27: 11:00:00 via INTRAVENOUS

## 2015-04-27 MED ORDER — ACETAMINOPHEN 325 MG PO TABS
ORAL_TABLET | ORAL | Status: AC
  Filled 2015-04-27: qty 2

## 2015-04-27 NOTE — Progress Notes (Signed)
  Echocardiogram 2D Echocardiogram has been performed.  Jennette Dubin 04/27/2015, 10:06 AM

## 2015-04-27 NOTE — Progress Notes (Deleted)
  Echocardiogram 2D Echocardiogram has been performed.  Jennette Dubin 04/27/2015, 10:08 AM

## 2015-04-27 NOTE — Patient Instructions (Signed)
[facility] Discharge Instructions for Patients Receiving Chemotherapy  Today you received the following chemotherapy agents Herceptin.  To help prevent nausea and vomiting after your treatment, we encourage you to take your nausea medication as prescribed by your physician. If you develop nausea and vomiting that is not controlled by your nausea medication, call the clinic.   BELOW ARE SYMPTOMS THAT SHOULD BE REPORTED IMMEDIATELY:  *FEVER GREATER THAN 100.5 F  *CHILLS WITH OR WITHOUT FEVER  NAUSEA AND VOMITING THAT IS NOT CONTROLLED WITH YOUR NAUSEA MEDICATION  *UNUSUAL SHORTNESS OF BREATH  *UNUSUAL BRUISING OR BLEEDING  TENDERNESS IN MOUTH AND THROAT WITH OR WITHOUT PRESENCE OF ULCERS  *URINARY PROBLEMS  *BOWEL PROBLEMS  UNUSUAL RASH Items with * indicate a potential emergency and should be followed up as soon as possible.  Feel free to call the clinic you have any questions or concerns. The clinic phone number is [phone].  Please show the CHEMO ALERT CARD at check-in to the Emergency Department and triage nurse.   

## 2015-05-03 ENCOUNTER — Other Ambulatory Visit (HOSPITAL_COMMUNITY): Payer: Self-pay | Admitting: *Deleted

## 2015-05-04 ENCOUNTER — Ambulatory Visit: Payer: Medicare Other

## 2015-05-04 ENCOUNTER — Other Ambulatory Visit: Payer: Medicare Other

## 2015-05-04 ENCOUNTER — Ambulatory Visit: Payer: Medicare Other | Admitting: Oncology

## 2015-05-10 ENCOUNTER — Other Ambulatory Visit: Payer: Self-pay | Admitting: *Deleted

## 2015-05-10 DIAGNOSIS — C50912 Malignant neoplasm of unspecified site of left female breast: Secondary | ICD-10-CM

## 2015-05-11 ENCOUNTER — Encounter: Payer: Medicare Other | Admitting: Nurse Practitioner

## 2015-05-11 ENCOUNTER — Encounter: Payer: Self-pay | Admitting: Nurse Practitioner

## 2015-05-11 ENCOUNTER — Ambulatory Visit (HOSPITAL_BASED_OUTPATIENT_CLINIC_OR_DEPARTMENT_OTHER): Payer: Medicare Other

## 2015-05-11 ENCOUNTER — Other Ambulatory Visit: Payer: Self-pay | Admitting: Nurse Practitioner

## 2015-05-11 ENCOUNTER — Other Ambulatory Visit (HOSPITAL_BASED_OUTPATIENT_CLINIC_OR_DEPARTMENT_OTHER): Payer: Medicare Other

## 2015-05-11 VITALS — BP 160/84 | HR 88 | Temp 97.8°F

## 2015-05-11 DIAGNOSIS — M810 Age-related osteoporosis without current pathological fracture: Secondary | ICD-10-CM

## 2015-05-11 DIAGNOSIS — C50919 Malignant neoplasm of unspecified site of unspecified female breast: Secondary | ICD-10-CM

## 2015-05-11 DIAGNOSIS — C50912 Malignant neoplasm of unspecified site of left female breast: Secondary | ICD-10-CM | POA: Diagnosis not present

## 2015-05-11 LAB — COMPREHENSIVE METABOLIC PANEL
ALK PHOS: 63 U/L (ref 40–150)
ALT: 19 U/L (ref 0–55)
ANION GAP: 8 meq/L (ref 3–11)
AST: 34 U/L (ref 5–34)
Albumin: 4 g/dL (ref 3.5–5.0)
BUN: 15.7 mg/dL (ref 7.0–26.0)
CALCIUM: 9.7 mg/dL (ref 8.4–10.4)
CHLORIDE: 112 meq/L — AB (ref 98–109)
CO2: 21 mEq/L — ABNORMAL LOW (ref 22–29)
CREATININE: 0.9 mg/dL (ref 0.6–1.1)
EGFR: 55 mL/min/{1.73_m2} — ABNORMAL LOW (ref >90)
Glucose: 96 mg/dl (ref 70–140)
Potassium: 4.3 mEq/L (ref 3.5–5.1)
Sodium: 141 mEq/L (ref 136–145)
Total Bilirubin: 0.56 mg/dL (ref 0.20–1.20)
Total Protein: 7.1 g/dL (ref 6.4–8.3)

## 2015-05-11 LAB — CBC WITH DIFFERENTIAL/PLATELET
BASO%: 0.4 % (ref 0.0–2.0)
BASOS ABS: 0 10*3/uL (ref 0.0–0.1)
EOS%: 3.8 % (ref 0.0–7.0)
Eosinophils Absolute: 0.2 10*3/uL (ref 0.0–0.5)
HEMATOCRIT: 42.9 % (ref 34.8–46.6)
HGB: 13.7 g/dL (ref 11.6–15.9)
LYMPH#: 0.8 10*3/uL — AB (ref 0.9–3.3)
LYMPH%: 18.4 % (ref 14.0–49.7)
MCH: 29.2 pg (ref 25.1–34.0)
MCHC: 31.9 g/dL (ref 31.5–36.0)
MCV: 91.5 fL (ref 79.5–101.0)
MONO#: 0.5 10*3/uL (ref 0.1–0.9)
MONO%: 11.1 % (ref 0.0–14.0)
NEUT#: 3 10*3/uL (ref 1.5–6.5)
NEUT%: 66.3 % (ref 38.4–76.8)
PLATELETS: 145 10*3/uL (ref 145–400)
RBC: 4.69 10*6/uL (ref 3.70–5.45)
RDW: 14.6 % — ABNORMAL HIGH (ref 11.2–14.5)
WBC: 4.5 10*3/uL (ref 3.9–10.3)

## 2015-05-11 MED ORDER — ACETAMINOPHEN 325 MG PO TABS: 650.0000 mg | ORAL_TABLET | Freq: Once | ORAL | Status: DC

## 2015-05-11 MED ORDER — TRASTUZUMAB CHEMO INJECTION 440 MG: 6.0000 mg/kg | Freq: Once | INTRAVENOUS | Status: DC

## 2015-05-11 MED ORDER — ZOLEDRONIC ACID 4 MG/5ML IV CONC
3.0000 mg | Freq: Once | INTRAVENOUS | Status: AC
  Administered 2015-05-11: 3 mg via INTRAVENOUS
  Filled 2015-05-11: qty 3.75

## 2015-05-11 MED ORDER — SODIUM CHLORIDE 0.9 % IV SOLN
Freq: Once | INTRAVENOUS | Status: AC
  Administered 2015-05-11: 13:00:00 via INTRAVENOUS

## 2015-05-11 MED ORDER — SODIUM CHLORIDE 0.9 % IV SOLN: Freq: Once | INTRAVENOUS | Status: DC

## 2015-05-11 NOTE — Patient Instructions (Signed)

## 2015-05-11 NOTE — Progress Notes (Signed)
Patient refused to see me today, stating that her appointment was to be with Dr. Jana Hakim. This was indeed the case up until 12/16, when scheduling informed her that her appointment on 1/11 would be moved to my schedule due to [facility]. According to the conversation documented by the scheduler on 12/16, she was advised that she could be accommodated on another day if she insisted upon seeing Dr. Jana Hakim, but she declined and agreed to see me on that date.  Unfortunately, this was not her disposition this morning. The nurse performed her medication reconciliation and I reviewed her labs and echocardiogram from my office, providing no physical exam or contact with the patient. She was then arrived directly to infusion. She will see Dr. Jana Hakim with her next treatment on 06/02/15.   Laurie Panda, NP 05/11/2015 11:26 AM

## 2015-05-11 NOTE — Progress Notes (Signed)
Patient refused to see me today, stating that her appointment was to be with Dr. Jana Hakim. This was indeed the case up until 12/16, when scheduling informed her that her appointment on 1/11 would be moved to my schedule due to [facility]. According to the conversation documented by the scheduler on 12/16, she was advised that she could be accommodated on another day if she insisted upon seeing Dr. Jana Hakim, but she declined and agreed to see me on that date.   Unfortunately, this was not her disposition this morning. The nurse performed her medication reconciliation and I reviewed her labs and echocardiogram from my office, providing no physical exam or contact with the patient. She was then arrived directly to infusion. She will see Dr. Jana Hakim with her next treatment on 06/02/15.   Laurie Panda, NP  05/11/2015 11:26 AM

## 2015-05-12 ENCOUNTER — Telehealth: Payer: Self-pay | Admitting: Oncology

## 2015-05-12 NOTE — Telephone Encounter (Signed)
Patient aware of 2/2 appts

## 2015-06-01 ENCOUNTER — Other Ambulatory Visit: Payer: Self-pay

## 2015-06-01 ENCOUNTER — Telehealth: Payer: Self-pay | Admitting: Oncology

## 2015-06-01 ENCOUNTER — Telehealth: Payer: Self-pay | Admitting: *Deleted

## 2015-06-01 DIAGNOSIS — C50912 Malignant neoplasm of unspecified site of left female breast: Secondary | ICD-10-CM

## 2015-06-01 NOTE — Telephone Encounter (Signed)
Returned call and spoke with patient to confirm her Feb 2 appointment time.

## 2015-06-01 NOTE — Telephone Encounter (Signed)
Patient's son Gershon Mussel called asking "what time mom is to come in tomorrow.  She says she needs to be there at 10:00 am."  First appointment is lab at 11:00 am with F/U and Herecptin treatment.  Asked to arrive early for registration process.  verbalized understanding.

## 2015-06-02 ENCOUNTER — Other Ambulatory Visit (HOSPITAL_BASED_OUTPATIENT_CLINIC_OR_DEPARTMENT_OTHER): Payer: Medicare Other

## 2015-06-02 ENCOUNTER — Ambulatory Visit (HOSPITAL_COMMUNITY)
Admission: RE | Admit: 2015-06-02 | Discharge: 2015-06-02 | Disposition: A | Discharge status: Home / Self Care | Payer: Medicare Other | Source: Ambulatory Visit | Attending: Oncology | Admitting: Oncology

## 2015-06-02 ENCOUNTER — Ambulatory Visit (HOSPITAL_BASED_OUTPATIENT_CLINIC_OR_DEPARTMENT_OTHER): Payer: Medicare Other | Admitting: Oncology

## 2015-06-02 ENCOUNTER — Ambulatory Visit (HOSPITAL_BASED_OUTPATIENT_CLINIC_OR_DEPARTMENT_OTHER): Payer: Medicare Other

## 2015-06-02 ENCOUNTER — Telehealth: Payer: Self-pay | Admitting: Oncology

## 2015-06-02 VITALS — BP 165/84 | HR 70 | Temp 97.4°F | Resp 17 | Ht 60.0 in | Wt 104.0 lb

## 2015-06-02 DIAGNOSIS — R0781 Pleurodynia: Secondary | ICD-10-CM | POA: Insufficient documentation

## 2015-06-02 DIAGNOSIS — C773 Secondary and unspecified malignant neoplasm of axilla and upper limb lymph nodes: Secondary | ICD-10-CM

## 2015-06-02 DIAGNOSIS — Z5112 Encounter for antineoplastic immunotherapy: Secondary | ICD-10-CM

## 2015-06-02 DIAGNOSIS — C252 Malignant neoplasm of tail of pancreas: Secondary | ICD-10-CM | POA: Diagnosis not present

## 2015-06-02 DIAGNOSIS — Z923 Personal history of irradiation: Secondary | ICD-10-CM | POA: Diagnosis not present

## 2015-06-02 DIAGNOSIS — R937 Abnormal findings on diagnostic imaging of other parts of musculoskeletal system: Secondary | ICD-10-CM | POA: Insufficient documentation

## 2015-06-02 DIAGNOSIS — M810 Age-related osteoporosis without current pathological fracture: Secondary | ICD-10-CM | POA: Diagnosis not present

## 2015-06-02 DIAGNOSIS — C50812 Malignant neoplasm of overlapping sites of left female breast: Secondary | ICD-10-CM

## 2015-06-02 DIAGNOSIS — Z171 Estrogen receptor negative status [ER-]: Secondary | ICD-10-CM | POA: Diagnosis not present

## 2015-06-02 DIAGNOSIS — C50912 Malignant neoplasm of unspecified site of left female breast: Secondary | ICD-10-CM | POA: Diagnosis present

## 2015-06-02 LAB — COMPREHENSIVE METABOLIC PANEL
ALBUMIN: 4.1 g/dL (ref 3.5–5.0)
ALK PHOS: 72 U/L (ref 40–150)
ALT: 17 U/L (ref 0–55)
AST: 31 U/L (ref 5–34)
Anion Gap: 9 mEq/L (ref 3–11)
BUN: 13.5 mg/dL (ref 7.0–26.0)
CHLORIDE: 111 meq/L — AB (ref 98–109)
CO2: 21 meq/L — AB (ref 22–29)
Calcium: 9.8 mg/dL (ref 8.4–10.4)
Creatinine: 0.9 mg/dL (ref 0.6–1.1)
EGFR: 53 mL/min/{1.73_m2} — AB (ref >90)
GLUCOSE: 110 mg/dL (ref 70–140)
POTASSIUM: 4.3 meq/L (ref 3.5–5.1)
SODIUM: 141 meq/L (ref 136–145)
Total Bilirubin: 0.5 mg/dL (ref 0.20–1.20)
Total Protein: 7.2 g/dL (ref 6.4–8.3)

## 2015-06-02 LAB — CBC WITH DIFFERENTIAL/PLATELET
BASO%: 0.6 % (ref 0.0–2.0)
BASOS ABS: 0 10*3/uL (ref 0.0–0.1)
EOS%: 3.3 % (ref 0.0–7.0)
Eosinophils Absolute: 0.2 10*3/uL (ref 0.0–0.5)
HCT: 43.5 % (ref 34.8–46.6)
HEMOGLOBIN: 14 g/dL (ref 11.6–15.9)
LYMPH%: 19.2 % (ref 14.0–49.7)
MCH: 29.4 pg (ref 25.1–34.0)
MCHC: 32.2 g/dL (ref 31.5–36.0)
MCV: 91.4 fL (ref 79.5–101.0)
MONO#: 0.6 10*3/uL (ref 0.1–0.9)
MONO%: 11.8 % (ref 0.0–14.0)
NEUT#: 3.2 10*3/uL (ref 1.5–6.5)
NEUT%: 65.1 % (ref 38.4–76.8)
Platelets: 158 10*3/uL (ref 145–400)
RBC: 4.76 10*6/uL (ref 3.70–5.45)
RDW: 14.6 % — AB (ref 11.2–14.5)
WBC: 4.9 10*3/uL (ref 3.9–10.3)
lymph#: 0.9 10*3/uL (ref 0.9–3.3)

## 2015-06-02 MED ORDER — ACETAMINOPHEN 325 MG PO TABS
650.0000 mg | ORAL_TABLET | Freq: Once | ORAL | Status: AC
  Administered 2015-06-02: 650 mg via ORAL

## 2015-06-02 MED ORDER — SODIUM CHLORIDE 0.9 % IV SOLN
Freq: Once | INTRAVENOUS | Status: AC
  Administered 2015-06-02: 13:00:00 via INTRAVENOUS

## 2015-06-02 MED ORDER — TRASTUZUMAB CHEMO INJECTION 440 MG
6.0000 mg/kg | Freq: Once | INTRAVENOUS | Status: AC
  Administered 2015-06-02: 294 mg via INTRAVENOUS
  Filled 2015-06-02: qty 14

## 2015-06-02 MED ORDER — ACETAMINOPHEN 325 MG PO TABS
ORAL_TABLET | ORAL | Status: AC
  Filled 2015-06-02: qty 2

## 2015-06-02 MED ORDER — ACETAMINOPHEN 325 MG PO TABS
ORAL_TABLET | ORAL | Status: AC
  Filled 2015-06-02: qty 1

## 2015-06-02 NOTE — Progress Notes (Signed)
[facility]  [phone] [phone]  OFFICE PROGRESS NOTE   ID: [name]   DOB: 20-Jan-1914  MR#: [id number]  RDN#:[id number]  Cc:  [name], [name]  CHIEF COMPLAINT: metastatic breast cancer  CURRENT TREATMENT: trastuzumab every 4 weeks  BREAST CANCER HISTORY: From the original intake note:  [name]'s gynecologist, Dr. Conley [name], palpated a mass in the patient's left breast in 2004. The patient has a daughter, who is a Engineer, civil (consulting), formerly I believe an oncology nurse, currently a pulmonary nurse in [place], so the patient went there for her care, and while I do not have all the workup, I do have the pathology report from October 01, 2002, which was her left lumpectomy and sentinel lymph node sampling under Dr. Rexene [name]. This showed [phone]) a 2.5 cm infiltrating ductal carcinoma with lobular features (the cells were E-cadherin positive), involving one of three sentinel lymph nodes. The anterior margin was close at less than 1 mm. The patient saw Dr. Chipper [name] here, and the option of completion mastectomy versus radiotherapy alone was discussed. He felt, and I would probably have agreed, that despite the very close margin, which was nevertheless technically negative, proceeding to radiation without "clearing the margin" further would be adequate. Accordingly, the patient did receive a total of 5040 cGy with an additional 1000 cGy boost to the left breast between September 11 and February 24, 2003.   The patient then did well, but in 2007 developed some redness and bumpiness around the left areola. She brought this to Dr. Wilfred [name] attention, and apparently an initial biopsy was negative, as was a mammogram, however, with further development of the change, the patient had a biopsy under anesthesia, and this apparently was positive (I do not have those records). With this information, she proceeded to left mastectomy on February 26, 2006. The pathology there ([name]) showed dermal involvement by adenocarcinoma, with pagetoid spread and lymphatic space invasion. The closest margin being 1.2 cm from the inferior margin. Everything else being amply negative. There were no discrete masses or lesions identified in the left breast. The patient subsequently underwent left mastectomy.   There were a series of further skin recurrences, first in November of 2008. Initially, these were resected, but later there were too many lesions to resect, in addition to a large left axillary lymph node. This was biopsied in January 2011, showing the tumor now to be [name] positive. Patient has been on anti-[name] treatments since January 2011, initially with trastuzumab plus lapatinib. Lapatinib was discontinued in April 2011 secondary to side effects. Continues now to receive trastuzumab on a q. 3 week basis. Also receives a zoledronic acid every 3 months.    INTERVAL HISTORY: [name] returns today for follow up of her HER-2 positive breast cancer, accompanied by her daughter-in-law. [name] continues to receive trastuzumab every 4 weeks. She tolerates this well. An echocardiogram 04/27/2015 showed a well preserved ejection fraction.  REVIEW OF SYSTEMS: [name] goes to church almost daily, goes to a Bible study weekly, and goes to a book club monthly. She cannot drive and she complains of being stuck in the house all the time. She is considering moving to [place] where there are many more activities. She was having some left rib cage pain last time we saw her and this persists and she feels it worse. It is not associated with pleurisy or hemoptysis. She denies shortness of breath, or cough. She does describe herself as mildly fatigued. She has mild  seasonal allergies. She has stress urinary incontinence. She denies unusual headaches, and now that she has had her left eye surgery she can see much better. She is moderately constipated but  does not like to take stool softeners because they tend to "overdo it". Otherwise a detailed review of systems today was entirely benign  PAST MEDICAL HISTORY: Past Medical History  Diagnosis Date  . Cancer ([place])     breast  . Cataract   . CAD (coronary artery disease)     cath w/ trivial disease  . Diverticulosis of colon   . Osteoporosis     s/p compression fx T12  . Macular degeneration   . Breast cancer ([place])     PAST SURGICAL HISTORY: Past Surgical History  Procedure Laterality Date  . Abdominal hysterectomy    . Breast lumpectomy    . Excision for paget's vulvar    . Vertbroplasty      T12  . Mastectomy      left  . Flexible sigmoidoscopy N/A 06/27/2012    Procedure: FLEXIBLE SIGMOIDOSCOPY;  Surgeon: Lafayette Dragon, MD;  Location: [facility];  Service: Endoscopy;  Laterality: N/A;    FAMILY HISTORY The patient's father died in an automobile accident at 23, the patient's mother from a stroke at 42. The patient had one brother who died from causes unknown to her at the age of 66, and a sister who had "lots of problems" and died at the age of 16, but there is no history of breast or ovarian cancer in the family.   GYNECOLOGIC HISTORY: She is GX P3. She took hormone replacement therapy briefly after menopause.   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 She has been a widow since 1997; lives independently, drives, and does all of her activities of daily living. Her daughter, [name], is a Marine scientist in [place]. Her daughter, [name], lives in [place].[name], who frequently comes with the patient to medical visits, is married to the patient's son, who is also her power of attorney. He works for the [employer]. The patient has six grandchildren and three great-grandchildren. She attends [employer].    ADVANCED DIRECTIVES: In place  HEALTH MAINTENANCE: Social History  Substance  Use Topics  . Smoking status: Former Smoker    Quit date: 05/01/1990  . Smokeless tobacco: Never Used  . Alcohol Use: No     Colonoscopy: 02/06/2002  PAP: Not on file  Bone density: Not on file  Lipid panel: Not on file  Allergies  Allergen Reactions  . Solifenacin Succinate     REACTION: "sick" nausea    Current Outpatient Prescriptions  Medication Sig Dispense Refill  . acetaminophen (TYLENOL) 500 MG tablet Take 1,000 mg by mouth daily.     Marland Kitchen losartan (COZAAR) 25 MG tablet TAKE ONE TABLET BY MOUTH ONCE DAILY 90 tablet 0  . Multiple Vitamins-Minerals (PRESERVISION AREDS 2 PO) Take 1 tablet by mouth 2 (two) times daily.    Marland Kitchen triamcinolone cream (KENALOG) 0.1 % Apply 1 application topically 2 (two) times daily as needed. (Patient not taking: Reported on 05/11/2015) 80 g 1   No current facility-administered medications for this visit.    OBJECTIVE: Elderly white woman who appears younger than stated age 47 Vitals:   06/02/15 1122  BP: 165/84  Pulse: 70  Temp: 97.4 F (36.3 C)  Resp: 17     Body mass index  is 20.31 kg/(m^2).    ECOG FS: 1  Sclerae unicteric, EOMs intact Oropharynx clear, slightly dry No cervical or supraclavicular adenopathy Lungs no rales or rhonchi Heart regular rate and rhythm Abd soft, nontender, positive bowel sounds MSK severe scoliosis on moderate kyphosis but no focal spinal tenderness, no tenderness to palpation of the left rib cage Neuro: nonfocal, well oriented, appropriate affect Breasts: The right breast is unremarkable. The left breast is status post mastectomy and radiation. There is no evidence of chest wall recurrence per the left axilla is benign.  LAB RESULTS: Lab Results  Component Value Date   WBC 4.9 06/02/2015   NEUTROABS 3.2 06/02/2015   HGB 14.0 06/02/2015   HCT 43.5 06/02/2015   MCV 91.4 06/02/2015   PLT 158 06/02/2015      Chemistry      Component Value Date/Time   NA 141 05/11/2015 1058   NA 141 12/09/2013 1245    K 4.3 05/11/2015 1058   K 4.1 12/09/2013 1245   CL 104 12/09/2013 1245   CL 106 10/15/2012 1136   CO2 21* 05/11/2015 1058   CO2 26 12/09/2013 1245   BUN 15.7 05/11/2015 1058   BUN 16 12/09/2013 1245   CREATININE 0.9 05/11/2015 1058   CREATININE 0.84 12/09/2013 1245      Component Value Date/Time   CALCIUM 9.7 05/11/2015 1058   CALCIUM 10.3 12/09/2013 1245   ALKPHOS 63 05/11/2015 1058   ALKPHOS 61 12/09/2013 1245   AST 34 05/11/2015 1058   AST 27 12/09/2013 1245   ALT 19 05/11/2015 1058   ALT 12 12/09/2013 1245   BILITOT 0.56 05/11/2015 1058   BILITOT 0.3 12/09/2013 1245       Lab Results  Component Value Date   LABCA2 17 09/26/2011     STUDIES:  No results found.  ASSESSMENT: Ms. Gouge is a 82 y.o.  [place], [place] woman:  (1) Status post left lumpectomy and sentinel lymph node dissection in 09/2002 for a T2 N1 (Stage II) invasive ductal carcinoma, triple negative, status post radiation therapy completed in 01/2003.  (2) Local recurrence around the areola 01/2006, leading to mastectomy.  (3) A series of further skin recurrences first noted in 03/2007. Initially these were resected, later with too many lesions to resect; there was also a large left axillary lymph node which was biopsied in 04/2009 showing the tumor now to be HER-2 positive.   (4) Since 04/2009 she has been on anti-HER-2 treatment, initially with trastuzumab plus lapatinib, with a lapatinib discontinued 07/2009 because of side effects.   (5) Recurrence of vulvar Paget's disease, originally diagnosed and resected in 2004 and recurred in 02/2012.  Followed by Dr. Paula Compton and Dr. Marti Sleigh.  (6) Continues to receive trastuzumab every 4 weeks and zoledronic acid every 3 months.  (a)  Echocardiogram 04/27/2015 showed an ejection fraction of 55-60%.  (7) Nocturnal leg cramping  PLAN: Kayona continues to do well clinically, now 9 years into her recurrent/metastatic  breast cancer. She is continuing trastuzumab, which she receives now every 4 weeks. She is getting echocardiograms every 6 months, with the most recent one, in late December, continuing to show an excellent ejection fraction.  She will turn and 89 this year. I [facility] to [facility] but [facility] have temporarily discontinued that service. We will try again later in the year.  She is considering moving to [place]. We discussed the process and minuses involved.  The only new finding  of concern is worsening persistent pain in the left rib cage area. The exam is entirely negative and palpation of that entire area does not elicit any tenderness. Nevertheless we will obtain a chest x-ray today and some plain left rib films for baseline.  Her blood pressure is always a bit elevated when she is here but she tells me it is never elevated when she takes it at home. She is taking her blood pressure medication faithfully.  Otherwise she will receive Herceptin today and monthly, until she sees me again in May. She will have a repeat echocardiogram before the May visit.  She knows to call for any problems that may develop before then. Chauncey Cruel, MD  06/02/2015 11:25 AM

## 2015-06-02 NOTE — Patient Instructions (Signed)
[facility] Discharge Instructions for Patients Receiving Chemotherapy  Today you received the following chemotherapy agents: Herceptin   To help prevent nausea and vomiting after your treatment, we encourage you to take your nausea medication as directed.    If you develop nausea and vomiting that is not controlled by your nausea medication, call the clinic.   BELOW ARE SYMPTOMS THAT SHOULD BE REPORTED IMMEDIATELY:  *FEVER GREATER THAN 100.5 F  *CHILLS WITH OR WITHOUT FEVER  NAUSEA AND VOMITING THAT IS NOT CONTROLLED WITH YOUR NAUSEA MEDICATION  *UNUSUAL SHORTNESS OF BREATH  *UNUSUAL BRUISING OR BLEEDING  TENDERNESS IN MOUTH AND THROAT WITH OR WITHOUT PRESENCE OF ULCERS  *URINARY PROBLEMS  *BOWEL PROBLEMS  UNUSUAL RASH Items with * indicate a potential emergency and should be followed up as soon as possible.  Feel free to call the clinic you have any questions or concerns. The clinic phone number is [phone].  Please show the CHEMO ALERT CARD at check-in to the Emergency Department and triage nurse.   

## 2015-06-02 NOTE — Telephone Encounter (Signed)
Appointments made and avs printed for patient,inbox to dr Jana Hakim to order echo mentioned in [place]

## 2015-06-02 NOTE — Addendum Note (Signed)
Addended by: Chauncey Cruel on: 06/02/2015 12:03 PM   Modules accepted: Orders

## 2015-06-03 ENCOUNTER — Other Ambulatory Visit: Payer: Self-pay | Admitting: Oncology

## 2015-06-03 NOTE — Progress Notes (Unsigned)
Called [name] to discuss results of her chest x-ray which is fine, and her rib films, which shows a nonspecific erosion. I really don't think we want to get into bone scans and CAT scans at this point, but certainly can do that if symptoms progress.  She developed phlebitis in the treatment arm. I will then alerted our head nurse to investigate and follow-up.

## 2015-06-28 ENCOUNTER — Other Ambulatory Visit: Payer: Self-pay | Admitting: *Deleted

## 2015-06-28 DIAGNOSIS — C50812 Malignant neoplasm of overlapping sites of left female breast: Secondary | ICD-10-CM

## 2015-06-29 ENCOUNTER — Ambulatory Visit (INDEPENDENT_AMBULATORY_CARE_PROVIDER_SITE_OTHER): Payer: Medicare Other | Admitting: Family

## 2015-06-29 ENCOUNTER — Other Ambulatory Visit (HOSPITAL_BASED_OUTPATIENT_CLINIC_OR_DEPARTMENT_OTHER): Payer: Medicare Other

## 2015-06-29 ENCOUNTER — Ambulatory Visit (HOSPITAL_BASED_OUTPATIENT_CLINIC_OR_DEPARTMENT_OTHER): Payer: Medicare Other

## 2015-06-29 ENCOUNTER — Encounter: Payer: Self-pay | Admitting: Family

## 2015-06-29 VITALS — BP 179/77 | HR 54 | Temp 97.6°F | Resp 17

## 2015-06-29 VITALS — BP 118/80 | HR 75 | Resp 14 | Ht 61.0 in | Wt 105.0 lb

## 2015-06-29 DIAGNOSIS — C50912 Malignant neoplasm of unspecified site of left female breast: Secondary | ICD-10-CM

## 2015-06-29 DIAGNOSIS — H6121 Impacted cerumen, right ear: Secondary | ICD-10-CM | POA: Diagnosis not present

## 2015-06-29 DIAGNOSIS — Z5112 Encounter for antineoplastic immunotherapy: Secondary | ICD-10-CM

## 2015-06-29 DIAGNOSIS — C773 Secondary and unspecified malignant neoplasm of axilla and upper limb lymph nodes: Secondary | ICD-10-CM

## 2015-06-29 DIAGNOSIS — C50812 Malignant neoplasm of overlapping sites of left female breast: Secondary | ICD-10-CM

## 2015-06-29 DIAGNOSIS — H6120 Impacted cerumen, unspecified ear: Secondary | ICD-10-CM | POA: Insufficient documentation

## 2015-06-29 LAB — COMPREHENSIVE METABOLIC PANEL
ALK PHOS: 62 U/L (ref 40–150)
ALT: 14 U/L (ref 0–55)
ANION GAP: 7 meq/L (ref 3–11)
AST: 28 U/L (ref 5–34)
Albumin: 4 g/dL (ref 3.5–5.0)
BILIRUBIN TOTAL: 0.5 mg/dL (ref 0.20–1.20)
BUN: 18.6 mg/dL (ref 7.0–26.0)
CALCIUM: 9.8 mg/dL (ref 8.4–10.4)
CO2: 23 meq/L (ref 22–29)
Chloride: 111 mEq/L — ABNORMAL HIGH (ref 98–109)
Creatinine: 0.9 mg/dL (ref 0.6–1.1)
EGFR: 51 mL/min/{1.73_m2} — AB (ref >90)
Glucose: 91 mg/dl (ref 70–140)
Potassium: 4.4 mEq/L (ref 3.5–5.1)
Sodium: 141 mEq/L (ref 136–145)
TOTAL PROTEIN: 7.1 g/dL (ref 6.4–8.3)

## 2015-06-29 LAB — CBC WITH DIFFERENTIAL/PLATELET
BASO%: 0.9 % (ref 0.0–2.0)
Basophils Absolute: 0 10*3/uL (ref 0.0–0.1)
EOS ABS: 0.2 10*3/uL (ref 0.0–0.5)
EOS%: 3.5 % (ref 0.0–7.0)
HEMATOCRIT: 41.5 % (ref 34.8–46.6)
HGB: 13.7 g/dL (ref 11.6–15.9)
LYMPH%: 18 % (ref 14.0–49.7)
MCH: 29.2 pg (ref 25.1–34.0)
MCHC: 32.9 g/dL (ref 31.5–36.0)
MCV: 88.7 fL (ref 79.5–101.0)
MONO#: 0.6 10*3/uL (ref 0.1–0.9)
MONO%: 11.6 % (ref 0.0–14.0)
NEUT%: 66 % (ref 38.4–76.8)
NEUTROS ABS: 3.2 10*3/uL (ref 1.5–6.5)
PLATELETS: 147 10*3/uL (ref 145–400)
RBC: 4.68 10*6/uL (ref 3.70–5.45)
RDW: 14.6 % — ABNORMAL HIGH (ref 11.2–14.5)
WBC: 4.9 10*3/uL (ref 3.9–10.3)
lymph#: 0.9 10*3/uL (ref 0.9–3.3)

## 2015-06-29 MED ORDER — ACETAMINOPHEN 325 MG PO TABS
ORAL_TABLET | ORAL | Status: AC
  Filled 2015-06-29: qty 2

## 2015-06-29 MED ORDER — TRASTUZUMAB CHEMO INJECTION 440 MG
6.0000 mg/kg | Freq: Once | INTRAVENOUS | Status: AC
  Administered 2015-06-29: 294 mg via INTRAVENOUS
  Filled 2015-06-29: qty 14

## 2015-06-29 MED ORDER — SODIUM CHLORIDE 0.9 % IV SOLN
Freq: Once | INTRAVENOUS | Status: AC
  Administered 2015-06-29: 11:00:00 via INTRAVENOUS

## 2015-06-29 MED ORDER — ACETAMINOPHEN 325 MG PO TABS
650.0000 mg | ORAL_TABLET | Freq: Once | ORAL | Status: AC
  Administered 2015-06-29: 650 mg via ORAL

## 2015-06-29 NOTE — Progress Notes (Signed)
Pre visit review using our clinic review tool, if applicable. No additional management support is needed unless otherwise documented below in the visit note. 

## 2015-06-29 NOTE — Assessment & Plan Note (Signed)
Symptoms and exam consistent with cerumen impaction on the right side. Right ear was cleaned out with warm water mixed with Docusate Sodium. There was wax return from the right ear and ear drum was visible. Patient tolerated this well. Given home solution for ear wax reduction. Follow up as needed.

## 2015-06-29 NOTE — Patient Instructions (Signed)
Cancer Center Discharge Instructions for Patients  Today you received the following: Herceptin   To help prevent nausea and vomiting after your treatment, we encourage you to take your nausea medication as directed.   If you develop nausea and vomiting that is not controlled by your nausea medication, call the clinic.   BELOW ARE SYMPTOMS THAT SHOULD BE REPORTED IMMEDIATELY:  *FEVER GREATER THAN 100.5 F  *CHILLS WITH OR WITHOUT FEVER  NAUSEA AND VOMITING THAT IS NOT CONTROLLED WITH YOUR NAUSEA MEDICATION  *UNUSUAL SHORTNESS OF BREATH  *UNUSUAL BRUISING OR BLEEDING  TENDERNESS IN MOUTH AND THROAT WITH OR WITHOUT PRESENCE OF ULCERS  *URINARY PROBLEMS  *BOWEL PROBLEMS  UNUSUAL RASH Items with * indicate a potential emergency and should be followed up as soon as possible.  Feel free to call the clinic you have any questions or concerns. The clinic phone number is [phone].  Please show the CHEMO ALERT CARD at check-in to the Emergency Department and triage nurse.   

## 2015-06-29 NOTE — Progress Notes (Addendum)
   Subjective:    Patient ID: [name], female    DOB: [birth year], 82 y.o.   MRN: [id number]  Chief Complaint  Patient presents with  . Ear Fullness    HPI:  [name] is a 82 y.o. female who  has a past medical history of Cancer ([place]); Cataract; CAD (coronary artery disease); Diverticulosis of colon; Osteoporosis; Macular degeneration; and Breast cancer ([place]). and presents today for an acute office visit.   1.) Right ear - She was recently evaluated for hearing aids at the hearing clinic and was advised to follow up to have her right ear cleaned from impacted cerumen  Allergies  Allergen Reactions  . Solifenacin Succinate     REACTION: "sick" nausea     Current Outpatient Prescriptions on File Prior to Visit  Medication Sig Dispense Refill  . acetaminophen (TYLENOL) 500 MG tablet Take 1,000 mg by mouth daily.     Marland Kitchen losartan (COZAAR) 25 MG tablet TAKE ONE TABLET BY MOUTH ONCE DAILY 90 tablet 0  . Multiple Vitamins-Minerals (PRESERVISION AREDS 2 PO) Take 1 tablet by mouth 2 (two) times daily.    Marland Kitchen triamcinolone cream (KENALOG) 0.1 % Apply 1 application topically 2 (two) times daily as needed. 80 g 1   No current facility-administered medications on file prior to visit.     Past Surgical History  Procedure Laterality Date  . Abdominal hysterectomy    . Breast lumpectomy    . Excision for paget's vulvar    . Vertbroplasty      T12  . Mastectomy      left  . Flexible sigmoidoscopy N/A 06/27/2012    Procedure: FLEXIBLE SIGMOIDOSCOPY;  Surgeon: Lafayette Dragon, MD;  Location: [facility];  Service: Endoscopy;  Laterality: N/A;       Review of Systems  Constitutional: Negative for fever and chills.  HENT: Positive for hearing loss. Negative for ear discharge and ear pain.       Objective:    BP 118/80 mmHg  Pulse 75  Resp 14  Ht 5\' 1"  (1.549 m)  Wt 105 lb (47.628 kg)  BMI 19.85 kg/m2  SpO2 94% Nursing note and vital signs reviewed.  Physical  Exam  Constitutional: She is oriented to person, place, and time. She appears well-developed and well-nourished. No distress.  HENT:  Impacted cerumen in right ear.   Cardiovascular: Normal rate, regular rhythm, normal heart sounds and intact distal pulses.   Pulmonary/Chest: Effort normal and breath sounds normal.  Neurological: She is alert and oriented to person, place, and time.  Skin: Skin is warm and dry.  Psychiatric: She has a normal mood and affect. Her behavior is normal. Judgment and thought content normal.       Assessment & Plan:   Problem List Items Addressed This Visit      Nervous and Auditory   Cerumen impaction - Primary    Symptoms and exam consistent with cerumen impaction on the right side. Right ear was cleaned out with warm water mixed with Docusate Sodium. There was wax return from the right ear and ear drum was visible. Patient tolerated this well. Given home solution for ear wax reduction. Follow up as needed.

## 2015-06-29 NOTE — Patient Instructions (Signed)
Thank you for choosing [facility].  Summary/Instructions:  Your prescription(s) have been submitted to your pharmacy or been printed and provided for you. Please take as directed and contact our office if you believe you are having problem(s) with the medication(s) or have any questions.  If your symptoms worsen or fail to improve, please contact our office for further instruction, or in case of emergency go directly to the emergency room at the closest medical facility.   Cerumen Impaction The structures of the external ear canal secrete a waxy substance known as cerumen. Excess cerumen can build up in the ear canal, causing a condition known as cerumen impaction. Cerumen impaction can cause ear pain and disrupt the function of the ear. The rate of cerumen production differs for each individual. In certain individuals, the configuration of the ear canal may decrease his or her ability to naturally remove cerumen. CAUSES Cerumen impaction is caused by excessive cerumen production or buildup. RISK FACTORS 1. Frequent use of swabs to clean ears. 2. Having narrow ear canals. 3. Having eczema. 4. Being dehydrated. SIGNS AND SYMPTOMS 1. Diminished hearing. 2. Ear drainage. 3. Ear pain. 4. Ear itch. TREATMENT Treatment may involve: 1. Over-the-counter or prescription ear drops to soften the cerumen. 2. Removal of cerumen by a health care provider. This may be done with: 1. Irrigation with warm water. This is the most common method of removal. 2. Ear curettes and other instruments. 3. Surgery. This may be done in severe cases. HOME CARE INSTRUCTIONS 1. Take medicines only as directed by your health care provider. 2. Do not insert objects into the ear with the intent of cleaning the ear. PREVENTION 1. Do not insert objects into the ear, even with the intent of cleaning the ear. Removing cerumen as a part of normal hygiene is not necessary, and the use of swabs in the ear canal is  not recommended. 2. Drink enough water to keep your urine clear or pale yellow. 3. Control your eczema if you have it. SEEK MEDICAL CARE IF:  You develop ear pain.  You develop bleeding from the ear.  The cerumen does not clear after you use ear drops as directed.   This information is not intended to replace advice given to you by your health care provider. Make sure you discuss any questions you have with your health care provider.   Document Released: 05/24/2004 Document Revised: 05/07/2014 Document Reviewed: 12/01/2014 Elsevier Interactive Patient Education 2016 [employer].   To prevent wax buildup within the ear:   Use equal parts of water and white vinegar  Soak a cotton ball in the solution and place several drops within the ear  Insert cotton ball in external ear canal and let sit for 30 minutes prior to shower  Remove cotton ball and gently irrigate the ear canal in the shower.  Do not irrigate directly into the ear but rather let it hit the external canal and irrigate.  For maintenance, this can be done 1 time weekly.

## 2015-07-11 ENCOUNTER — Ambulatory Visit (INDEPENDENT_AMBULATORY_CARE_PROVIDER_SITE_OTHER): Payer: Medicare Other | Admitting: Internal Medicine

## 2015-07-11 ENCOUNTER — Encounter: Payer: Self-pay | Admitting: Internal Medicine

## 2015-07-11 VITALS — BP 130/82 | HR 77 | Temp 97.9°F | Resp 14 | Ht 60.0 in | Wt 103.0 lb

## 2015-07-11 DIAGNOSIS — I10 Essential (primary) hypertension: Secondary | ICD-10-CM | POA: Diagnosis not present

## 2015-07-11 DIAGNOSIS — N3946 Mixed incontinence: Secondary | ICD-10-CM

## 2015-07-11 MED ORDER — MIRABEGRON ER 50 MG PO TB24: 50.0000 mg | ORAL_TABLET | Freq: Every day | ORAL | Status: DC

## 2015-07-11 NOTE — Progress Notes (Signed)
   Subjective:    Patient ID: [name], female    DOB: Dec 12, 1913, 82 y.o.   MRN: [id number]  HPI The patient is a 82 YO female coming in for follow up of her blood pressure. Denies new complaints. No headaches or chest pains. No SOB or fatigue. Taking losartan daily without side effects. Also having worsening of her bladder incontinence and would like to take something for it if possible. She has taken Lisbeth Ply in the past and it constipated her and she would not like to take it again.   Review of Systems  Constitutional: Negative for fever, activity change, appetite change, fatigue and unexpected weight change.  Respiratory: Negative for cough, chest tightness, shortness of breath and wheezing.   Cardiovascular: Negative for chest pain, palpitations and leg swelling.  Gastrointestinal: Negative for nausea, abdominal pain, diarrhea, constipation and abdominal distention.  Genitourinary:       Incontinence  Musculoskeletal: Positive for arthralgias. Negative for myalgias, back pain and gait problem.  Skin: Negative.   Neurological: Negative.   Psychiatric/Behavioral: Negative.       Objective:   Physical Exam  Constitutional: She is oriented to person, place, and time. She appears well-developed and well-nourished.  Thin  HENT:  Head: Normocephalic and atraumatic.  Eyes: EOM are normal.  Neck: Normal range of motion.  Cardiovascular: Normal rate and regular rhythm.   Pulmonary/Chest: Effort normal and breath sounds normal. No respiratory distress. She has no wheezes.  Abdominal: Soft. Bowel sounds are normal. She exhibits no distension. There is no tenderness. There is no rebound.  Musculoskeletal: She exhibits no edema.  Neurological: She is alert and oriented to person, place, and time.  Skin: Skin is warm and dry.  Psychiatric: She has a normal mood and affect.   Filed Vitals:   07/11/15 1441  BP: 130/82  Pulse: 77  Temp: 97.9 F (36.6 C)  TempSrc: Oral  Resp: 14    Height: 5' (1.524 m)  Weight: 103 lb (46.72 kg)  SpO2: 90%      Assessment & Plan:

## 2015-07-11 NOTE — Assessment & Plan Note (Signed)
Rx for myrbetriq for her to try for the bladder, we did discuss symptoms with her.

## 2015-07-11 NOTE — Progress Notes (Signed)
Pre visit review using our clinic review tool, if applicable. No additional management support is needed unless otherwise documented below in the visit note. 

## 2015-07-11 NOTE — Assessment & Plan Note (Signed)
BP at goal on losartan and last BMP without indication for change.

## 2015-07-11 NOTE — Patient Instructions (Signed)
We have sent in myrbetriq for the bladder. It is 1 pill per day that helps the bladder be stronger.   It can take 4 weeks to take full effect.

## 2015-07-26 ENCOUNTER — Other Ambulatory Visit: Payer: Self-pay | Admitting: Oncology

## 2015-07-27 ENCOUNTER — Ambulatory Visit (HOSPITAL_BASED_OUTPATIENT_CLINIC_OR_DEPARTMENT_OTHER): Payer: Medicare Other

## 2015-07-27 ENCOUNTER — Other Ambulatory Visit (HOSPITAL_BASED_OUTPATIENT_CLINIC_OR_DEPARTMENT_OTHER): Payer: Medicare Other

## 2015-07-27 VITALS — BP 165/80 | HR 60 | Temp 97.0°F | Resp 18

## 2015-07-27 DIAGNOSIS — Z5112 Encounter for antineoplastic immunotherapy: Secondary | ICD-10-CM

## 2015-07-27 DIAGNOSIS — C50919 Malignant neoplasm of unspecified site of unspecified female breast: Secondary | ICD-10-CM

## 2015-07-27 DIAGNOSIS — C50912 Malignant neoplasm of unspecified site of left female breast: Secondary | ICD-10-CM

## 2015-07-27 DIAGNOSIS — C50812 Malignant neoplasm of overlapping sites of left female breast: Secondary | ICD-10-CM

## 2015-07-27 LAB — BASIC METABOLIC PANEL
ANION GAP: 8 meq/L (ref 3–11)
BUN: 12.2 mg/dL (ref 7.0–26.0)
CHLORIDE: 110 meq/L — AB (ref 98–109)
CO2: 24 mEq/L (ref 22–29)
CREATININE: 0.8 mg/dL (ref 0.6–1.1)
Calcium: 9.8 mg/dL (ref 8.4–10.4)
EGFR: 57 mL/min/{1.73_m2} — ABNORMAL LOW (ref >90)
Glucose: 84 mg/dl (ref 70–140)
Potassium: 4.2 mEq/L (ref 3.5–5.1)
SODIUM: 142 meq/L (ref 136–145)

## 2015-07-27 MED ORDER — ACETAMINOPHEN 325 MG PO TABS
650.0000 mg | ORAL_TABLET | Freq: Once | ORAL | Status: AC
Start: 2015-07-27 — End: 2015-07-27
  Administered 2015-07-27: 650 mg via ORAL

## 2015-07-27 MED ORDER — ACETAMINOPHEN 325 MG PO TABS
ORAL_TABLET | ORAL | Status: AC
  Filled 2015-07-27: qty 2

## 2015-07-27 MED ORDER — TRASTUZUMAB CHEMO INJECTION 440 MG
6.0000 mg/kg | Freq: Once | INTRAVENOUS | Status: AC
  Administered 2015-07-27: 294 mg via INTRAVENOUS
  Filled 2015-07-27: qty 14

## 2015-07-27 MED ORDER — SODIUM CHLORIDE 0.9 % IV SOLN
Freq: Once | INTRAVENOUS | Status: AC
  Administered 2015-07-27: 12:00:00 via INTRAVENOUS

## 2015-07-27 NOTE — Patient Instructions (Signed)
[facility] Discharge Instructions for Patients  Today you received the following: Herceptin   To help prevent nausea and vomiting after your treatment, we encourage you to take your nausea medication as directed.   If you develop nausea and vomiting that is not controlled by your nausea medication, call the clinic.   BELOW ARE SYMPTOMS THAT SHOULD BE REPORTED IMMEDIATELY:  *FEVER GREATER THAN 100.5 F  *CHILLS WITH OR WITHOUT FEVER  NAUSEA AND VOMITING THAT IS NOT CONTROLLED WITH YOUR NAUSEA MEDICATION  *UNUSUAL SHORTNESS OF BREATH  *UNUSUAL BRUISING OR BLEEDING  TENDERNESS IN MOUTH AND THROAT WITH OR WITHOUT PRESENCE OF ULCERS  *URINARY PROBLEMS  *BOWEL PROBLEMS  UNUSUAL RASH Items with * indicate a potential emergency and should be followed up as soon as possible.  Feel free to call the clinic you have any questions or concerns. The clinic phone number is [phone].  Please show the CHEMO ALERT CARD at check-in to the Emergency Department and triage nurse.   

## 2015-07-29 ENCOUNTER — Other Ambulatory Visit (HOSPITAL_COMMUNITY): Payer: Self-pay | Admitting: Internal Medicine

## 2015-08-02 ENCOUNTER — Other Ambulatory Visit (HOSPITAL_COMMUNITY): Payer: Self-pay | Admitting: *Deleted

## 2015-08-24 ENCOUNTER — Ambulatory Visit: Payer: Medicare Other

## 2015-08-24 ENCOUNTER — Ambulatory Visit (HOSPITAL_BASED_OUTPATIENT_CLINIC_OR_DEPARTMENT_OTHER): Payer: Medicare Other

## 2015-08-24 ENCOUNTER — Other Ambulatory Visit (HOSPITAL_BASED_OUTPATIENT_CLINIC_OR_DEPARTMENT_OTHER): Payer: Medicare Other

## 2015-08-24 VITALS — BP 157/71 | HR 67 | Temp 97.6°F | Resp 17

## 2015-08-24 DIAGNOSIS — C50812 Malignant neoplasm of overlapping sites of left female breast: Secondary | ICD-10-CM

## 2015-08-24 DIAGNOSIS — C50912 Malignant neoplasm of unspecified site of left female breast: Secondary | ICD-10-CM

## 2015-08-24 DIAGNOSIS — C773 Secondary and unspecified malignant neoplasm of axilla and upper limb lymph nodes: Secondary | ICD-10-CM

## 2015-08-24 DIAGNOSIS — Z5112 Encounter for antineoplastic immunotherapy: Secondary | ICD-10-CM | POA: Diagnosis present

## 2015-08-24 DIAGNOSIS — M810 Age-related osteoporosis without current pathological fracture: Secondary | ICD-10-CM

## 2015-08-24 DIAGNOSIS — C50919 Malignant neoplasm of unspecified site of unspecified female breast: Secondary | ICD-10-CM

## 2015-08-24 LAB — BASIC METABOLIC PANEL
Anion Gap: 7 mEq/L (ref 3–11)
BUN: 13.4 mg/dL (ref 7.0–26.0)
CALCIUM: 9.5 mg/dL (ref 8.4–10.4)
CO2: 23 meq/L (ref 22–29)
CREATININE: 0.8 mg/dL (ref 0.6–1.1)
Chloride: 113 mEq/L — ABNORMAL HIGH (ref 98–109)
EGFR: 58 mL/min/{1.73_m2} — ABNORMAL LOW (ref >90)
GLUCOSE: 110 mg/dL (ref 70–140)
Potassium: 4 mEq/L (ref 3.5–5.1)
SODIUM: 143 meq/L (ref 136–145)

## 2015-08-24 MED ORDER — ACETAMINOPHEN 325 MG PO TABS
ORAL_TABLET | ORAL | Status: AC
  Filled 2015-08-24: qty 2

## 2015-08-24 MED ORDER — SODIUM CHLORIDE 0.9 % IV SOLN
Freq: Once | INTRAVENOUS | Status: AC
  Administered 2015-08-24: 11:00:00 via INTRAVENOUS

## 2015-08-24 MED ORDER — ZOLEDRONIC ACID 4 MG/5ML IV CONC
3.0000 mg | Freq: Once | INTRAVENOUS | Status: AC
  Administered 2015-08-24: 3 mg via INTRAVENOUS
  Filled 2015-08-24: qty 3.75

## 2015-08-24 MED ORDER — SODIUM CHLORIDE 0.9 % IV SOLN
6.0000 mg/kg | Freq: Once | INTRAVENOUS | Status: AC
  Administered 2015-08-24: 294 mg via INTRAVENOUS
  Filled 2015-08-24: qty 14

## 2015-08-24 MED ORDER — ACETAMINOPHEN 325 MG PO TABS
650.0000 mg | ORAL_TABLET | Freq: Once | ORAL | Status: AC
Start: 2015-08-24 — End: 2015-08-24
  Administered 2015-08-24: 650 mg via ORAL

## 2015-08-24 NOTE — Patient Instructions (Signed)
[facility] Discharge Instructions for Patients Receiving Chemotherapy  Today you received the following chemotherapy agents: Herceptin;Zometa  To help prevent nausea and vomiting after your treatment, we encourage you to take your nausea medication: as directed.   If you develop nausea and vomiting that is not controlled by your nausea medication, call the clinic.   BELOW ARE SYMPTOMS THAT SHOULD BE REPORTED IMMEDIATELY:  *FEVER GREATER THAN 100.5 F  *CHILLS WITH OR WITHOUT FEVER  NAUSEA AND VOMITING THAT IS NOT CONTROLLED WITH YOUR NAUSEA MEDICATION  *UNUSUAL SHORTNESS OF BREATH  *UNUSUAL BRUISING OR BLEEDING  TENDERNESS IN MOUTH AND THROAT WITH OR WITHOUT PRESENCE OF ULCERS  *URINARY PROBLEMS  *BOWEL PROBLEMS  UNUSUAL RASH Items with * indicate a potential emergency and should be followed up as soon as possible.  Feel free to call the clinic you have any questions or concerns. The clinic phone number is [phone].  Please show the [place] [facility] at check-in to the Emergency Department and triage nurse.

## 2015-09-21 ENCOUNTER — Telehealth: Payer: Self-pay | Admitting: Oncology

## 2015-09-21 ENCOUNTER — Ambulatory Visit: Payer: Medicare Other

## 2015-09-21 ENCOUNTER — Other Ambulatory Visit: Payer: Medicare Other

## 2015-09-21 ENCOUNTER — Other Ambulatory Visit: Payer: Self-pay | Admitting: Oncology

## 2015-09-21 DIAGNOSIS — Z09 Encounter for follow-up examination after completed treatment for conditions other than malignant neoplasm: Secondary | ICD-10-CM

## 2015-09-21 NOTE — Telephone Encounter (Signed)
Spoke with patient re coming in earlier tomorrow @ 9:30 am for a 10 am echo at [facility] prior to her [facility] lab/fu/tx at 11:30 am. Per patient she will check with her ride and call me back if she cannot make it. Patient has my name and direct phone number.

## 2015-09-22 ENCOUNTER — Telehealth: Payer: Self-pay | Admitting: Oncology

## 2015-09-22 ENCOUNTER — Ambulatory Visit (HOSPITAL_COMMUNITY)
Admission: RE | Admit: 2015-09-22 | Discharge: 2015-09-22 | Disposition: A | Discharge status: Home / Self Care | Payer: Medicare Other | Source: Ambulatory Visit | Attending: Oncology | Admitting: Oncology

## 2015-09-22 ENCOUNTER — Other Ambulatory Visit (HOSPITAL_BASED_OUTPATIENT_CLINIC_OR_DEPARTMENT_OTHER): Payer: Medicare Other

## 2015-09-22 ENCOUNTER — Ambulatory Visit: Payer: Medicare Other

## 2015-09-22 ENCOUNTER — Ambulatory Visit (HOSPITAL_BASED_OUTPATIENT_CLINIC_OR_DEPARTMENT_OTHER): Payer: Medicare Other

## 2015-09-22 ENCOUNTER — Ambulatory Visit (HOSPITAL_BASED_OUTPATIENT_CLINIC_OR_DEPARTMENT_OTHER): Payer: Medicare Other | Admitting: Oncology

## 2015-09-22 VITALS — BP 171/95 | HR 58 | Temp 97.7°F | Resp 18 | Ht 60.0 in | Wt 102.7 lb

## 2015-09-22 DIAGNOSIS — C50912 Malignant neoplasm of unspecified site of left female breast: Secondary | ICD-10-CM | POA: Diagnosis present

## 2015-09-22 DIAGNOSIS — Z5112 Encounter for antineoplastic immunotherapy: Secondary | ICD-10-CM | POA: Diagnosis present

## 2015-09-22 DIAGNOSIS — R309 Painful micturition, unspecified: Secondary | ICD-10-CM

## 2015-09-22 DIAGNOSIS — I351 Nonrheumatic aortic (valve) insufficiency: Secondary | ICD-10-CM | POA: Diagnosis not present

## 2015-09-22 DIAGNOSIS — I517 Cardiomegaly: Secondary | ICD-10-CM | POA: Diagnosis not present

## 2015-09-22 DIAGNOSIS — Z853 Personal history of malignant neoplasm of breast: Secondary | ICD-10-CM | POA: Insufficient documentation

## 2015-09-22 DIAGNOSIS — R001 Bradycardia, unspecified: Secondary | ICD-10-CM | POA: Insufficient documentation

## 2015-09-22 DIAGNOSIS — Z171 Estrogen receptor negative status [ER-]: Secondary | ICD-10-CM

## 2015-09-22 DIAGNOSIS — C773 Secondary and unspecified malignant neoplasm of axilla and upper limb lymph nodes: Secondary | ICD-10-CM

## 2015-09-22 DIAGNOSIS — M810 Age-related osteoporosis without current pathological fracture: Secondary | ICD-10-CM

## 2015-09-22 DIAGNOSIS — C50812 Malignant neoplasm of overlapping sites of left female breast: Secondary | ICD-10-CM

## 2015-09-22 DIAGNOSIS — Z09 Encounter for follow-up examination after completed treatment for conditions other than malignant neoplasm: Secondary | ICD-10-CM | POA: Diagnosis not present

## 2015-09-22 DIAGNOSIS — C50919 Malignant neoplasm of unspecified site of unspecified female breast: Secondary | ICD-10-CM

## 2015-09-22 DIAGNOSIS — R300 Dysuria: Secondary | ICD-10-CM

## 2015-09-22 DIAGNOSIS — G4762 Sleep related leg cramps: Secondary | ICD-10-CM | POA: Diagnosis not present

## 2015-09-22 LAB — URINALYSIS, MICROSCOPIC - CHCC
BILIRUBIN (URINE): NEGATIVE
BLOOD: NEGATIVE
Glucose: NEGATIVE mg/dL
KETONES: NEGATIVE mg/dL
LEUKOCYTE ESTERASE: NEGATIVE
Nitrite: NEGATIVE
PH: 7 (ref 4.6–8.0)
Protein: NEGATIVE mg/dL
RBC / HPF: NEGATIVE (ref 0–2)
Specific Gravity, Urine: 1.01 (ref 1.003–1.035)
Urobilinogen, UR: 0.2 mg/dL (ref 0.2–1)
WBC, UA: NEGATIVE (ref 0–2)

## 2015-09-22 LAB — BASIC METABOLIC PANEL
Anion Gap: 10 mEq/L (ref 3–11)
BUN: 12.5 mg/dL (ref 7.0–26.0)
CALCIUM: 10.4 mg/dL (ref 8.4–10.4)
CO2: 24 meq/L (ref 22–29)
CREATININE: 0.9 mg/dL (ref 0.6–1.1)
Chloride: 108 mEq/L (ref 98–109)
EGFR: 56 mL/min/{1.73_m2} — ABNORMAL LOW (ref >90)
Glucose: 86 mg/dl (ref 70–140)
Potassium: 4.2 mEq/L (ref 3.5–5.1)
SODIUM: 142 meq/L (ref 136–145)

## 2015-09-22 MED ORDER — ACETAMINOPHEN 325 MG PO TABS
ORAL_TABLET | ORAL | Status: AC
  Filled 2015-09-22: qty 2

## 2015-09-22 MED ORDER — SODIUM CHLORIDE 0.9 % IV SOLN
Freq: Once | INTRAVENOUS | Status: AC
  Administered 2015-09-22: 13:00:00 via INTRAVENOUS

## 2015-09-22 MED ORDER — TRASTUZUMAB CHEMO INJECTION 440 MG
6.0000 mg/kg | Freq: Once | INTRAVENOUS | Status: AC
  Administered 2015-09-22: 294 mg via INTRAVENOUS
  Filled 2015-09-22: qty 14

## 2015-09-22 MED ORDER — ACETAMINOPHEN 325 MG PO TABS
650.0000 mg | ORAL_TABLET | Freq: Once | ORAL | Status: AC
  Administered 2015-09-22: 650 mg via ORAL

## 2015-09-22 NOTE — Telephone Encounter (Signed)
appt made and avs printed. Pt to call and cancel other appt scheduled for 9/14 to make room for treatment

## 2015-09-22 NOTE — Progress Notes (Signed)
  Echocardiogram 2D Echocardiogram has been performed.  Bobbye Charleston 09/22/2015, 10:54 AM

## 2015-09-22 NOTE — Progress Notes (Signed)
[name]  [phone] [phone]  OFFICE PROGRESS NOTE   ID: CARILYN [name]   DOB: 20-Dec-1913  MR#: [id number]  QHU#:[id number]  Cc:  Azalee Course, Glori Bickers  CHIEF COMPLAINT: metastatic breast cancer  CURRENT TREATMENT: trastuzumab every 4 weeks; zolendronate every 12 weeks  BREAST CANCER HISTORY: From the original intake note:  Mrs. Dugdale's gynecologist, Dr. Josefa Half, palpated a mass in the patient's left breast in 2004. The patient has a daughter, who is a Marine scientist, formerly I believe an oncology nurse, currently a pulmonary nurse in [place], so the patient went there for her care, and while I do not have all the workup, I do have the pathology report from October 01, 2002, which was her left lumpectomy and sentinel lymph node sampling under Dr. Kemper Durie. This showed [phone]) a 2.5 cm infiltrating ductal carcinoma with lobular features (the cells were E-cadherin positive), involving one of three sentinel lymph nodes. The anterior margin was close at less than 1 mm. The patient saw Dr. Arloa Koh here, and the option of completion mastectomy versus radiotherapy alone was discussed. He felt, and I would probably have agreed, that despite the very close margin, which was nevertheless technically negative, proceeding to radiation without "clearing the margin" further would be adequate. Accordingly, the patient did receive a total of 5040 cGy with an additional 1000 cGy boost to the left breast between September 11 and February 24, 2003.   The patient then did well, but in 2007 developed some redness and bumpiness around the left areola. She brought this to Dr. Ermelinda Das attention, and apparently an initial biopsy was negative, as was a mammogram, however, with further development of the change, the patient had a biopsy under anesthesia, and this apparently was positive (I do not have those records). With this information, she proceeded to  left mastectomy on February 26, 2006. The pathology there ([id number]) showed dermal involvement by adenocarcinoma, with pagetoid spread and lymphatic space invasion. The closest margin being 1.2 cm from the inferior margin. Everything else being amply negative. There were no discrete masses or lesions identified in the left breast. The patient subsequently underwent left mastectomy.   There were a series of further skin recurrences, first in November of 2008. Initially, these were resected, but later there were too many lesions to resect, in addition to a large left axillary lymph node. This was biopsied in January 2011, showing the tumor now to be HER-2/neu positive. Patient has been on anti-HER-2/neu treatments since January 2011, initially with trastuzumab plus lapatinib. Lapatinib was discontinued in April 2011 secondary to side effects. Continues now to receive trastuzumab on a q. 3 week basis. Also receives a zoledronic acid every 3 months.    INTERVAL HISTORY: [name] returns today for follow up of her locally recurrent breast cancer, accompanied by her daughter-in-law. [name] receives trastuzumab every 4 weeks and zolendronate every 12 weeks. She is due for trastuzumab today. She had an echocardiogram this morning, with results pending.  REVIEW OF SYSTEMS: Dawnetta complains of aches and pains here and there, but these are not more intense or persistent than before. She sleeps poorly. She is mildly to moderately fatigued. Her vision is not good and today she forgot to wear her hearing aid. She is having some increased urinary frequency and some dysuria. She has not noticed any hematuria. She bruises easily. Aside from these issues, she is "fine". A detailed review of systems today was otherwise noncontributory  PAST MEDICAL HISTORY: Past Medical  History  Diagnosis Date  . Cancer ([place])     breast  . Cataract   . CAD (coronary artery disease)     cath w/ trivial disease  .  Diverticulosis of colon   . Osteoporosis     s/p compression fx T12  . Macular degeneration   . Breast cancer ([place])     PAST SURGICAL HISTORY: Past Surgical History  Procedure Laterality Date  . Abdominal hysterectomy    . Breast lumpectomy    . Excision for paget's vulvar    . Vertbroplasty      T12  . Mastectomy      left  . Flexible sigmoidoscopy N/A 06/27/2012    Procedure: FLEXIBLE SIGMOIDOSCOPY;  Surgeon: Lafayette Dragon, MD;  Location: [facility] END[facility]COPY;  Service: Endoscopy;  Laterality: N/A;    FAMILY HISTORY The patient's father died in an automobile accident at 42, the patient's mother from a stroke at 59. The patient had one brother who died from causes unknown to her at the age of 38, and a sister who had "lots of problems" and died at the age of 37, but there is no history of breast or ovarian cancer in the family.   GYNECOLOGIC HISTORY: She is GX P3. She took hormone replacement therapy briefly after menopause.   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 She has been a widow since 1997; lives independently, drives, and does all of her activities of daily living. Her daughter, [name], is a Marine scientist in [place]. Her daughter, [name], lives in [place].[name], who frequently comes with the patient to medical visits, is married to the patient's son, who is also her power of attorney. He works for the [employer]. The patient has six grandchildren and three great-grandchildren. She attends [employer].    ADVANCED DIRECTIVES: In place  HEALTH MAINTENANCE: Social History  Substance Use Topics  . Smoking status: Former Smoker    Quit date: 05/01/1990  . Smokeless tobacco: Never Used  . Alcohol Use: No     Colonoscopy: 02/06/2002  PAP: Not on file  Bone density: Not on file  Lipid panel: Not on file  Allergies  Allergen Reactions  . Solifenacin Succinate     REACTION: "sick"  nausea    Current Outpatient Prescriptions  Medication Sig Dispense Refill  . acetaminophen (TYLENOL) 500 MG tablet Take 1,000 mg by mouth daily.     Marland Kitchen losartan (COZAAR) 25 MG tablet TAKE ONE TABLET BY MOUTH ONCE DAILY. 90 tablet 6  . mirabegron ER (MYRBETRIQ) 50 MG TB24 tablet Take 1 tablet (50 mg total) by mouth daily. 30 tablet 6  . Multiple Vitamins-Minerals (PRESERVISION AREDS 2 PO) Take 1 tablet by mouth 2 (two) times daily.    Marland Kitchen triamcinolone cream (KENALOG) 0.1 % Apply 1 application topically 2 (two) times daily as needed. 80 g 1   No current facility-administered medications for this visit.    OBJECTIVE: Elderly white woman In no acute distress Filed Vitals:   09/22/15 1134  BP: 171/95  Pulse: 58  Temp: 97.7 F (36.5 C)  Resp: 18     Body mass index is 20.06 kg/(m^2).    ECOG FS: 1  Sclerae unicteric,  Oropharynx clear and moist-- no thrush or other lesions No cervical or supraclavicular adenopathy Lungs no rales or rhonchi Heart regular rate and rhythm Abd soft, nontender, positive bowel sounds MSK no focal spinal  tenderness, no upper extremity lymphedema Neuro: nonfocal, well oriented, appropriate affect Breasts: The right breast is unremarkable. There are no suspicious masses or other findings of concern. The left breast is status post mastectomy. Left chest wall is status post radiation. There is no evidence of further local recurrence. The left axilla is benign.   LAB RESULTS: Lab Results  Component Value Date   WBC 4.9 06/29/2015   NEUTROABS 3.2 06/29/2015   HGB 13.7 06/29/2015   HCT 41.5 06/29/2015   MCV 88.7 06/29/2015   PLT 147 06/29/2015      Chemistry      Component Value Date/Time   NA 142 09/22/2015 1114   NA 141 12/09/2013 1245   K 4.2 09/22/2015 1114   K 4.1 12/09/2013 1245   CL 104 12/09/2013 1245   CL 106 10/15/2012 1136   CO2 24 09/22/2015 1114   CO2 26 12/09/2013 1245   BUN 12.5 09/22/2015 1114   BUN 16 12/09/2013 1245    CREATININE 0.9 09/22/2015 1114   CREATININE 0.84 12/09/2013 1245      Component Value Date/Time   CALCIUM 10.4 09/22/2015 1114   CALCIUM 10.3 12/09/2013 1245   ALKPHOS 62 06/29/2015 1038   ALKPHOS 61 12/09/2013 1245   AST 28 06/29/2015 1038   AST 27 12/09/2013 1245   ALT 14 06/29/2015 1038   ALT 12 12/09/2013 1245   BILITOT 0.50 06/29/2015 1038   BILITOT 0.3 12/09/2013 1245       Lab Results  Component Value Date   LABCA2 17 09/26/2011     STUDIES:  No results found.  ASSESSMENT: Ms. Shiffman is a 82 y.o.  [place], [place] woman:  (1) Status post left lumpectomy and sentinel lymph node dissection in 09/2002 for a T2 N1 (Stage II) invasive ductal carcinoma, triple negative, status post radiation therapy completed in 01/2003.  (2) Local recurrence around the areola 01/2006, leading to mastectomy.  (3) A series of further skin recurrences first noted in 03/2007. Initially these were resected, later with too many lesions to resect; there was also a large left axillary lymph node which was biopsied in 04/2009 showing the tumor now to be HER-2 positive.   (4) Since 04/2009 she has been on anti-HER-2 treatment, initially with trastuzumab plus lapatinib, with a lapatinib discontinued 07/2009 because of side effects.   (5) Recurrence of vulvar Paget's disease, originally diagnosed and resected in 2004 and recurred in 02/2012.  Followed by Dr. Paula Compton and Dr. Marti Sleigh.  (6) Continues to receive trastuzumab every 4 weeks and zoledronic acid every 3 months.  (a)  Echocardiogram Repeated 09/22/2015, with results pending  (7) Nocturnal leg cramping  PLAN: Keashia is now 5 years out from her last evidence of recurrent breast cancer, with no evidence of active disease. This is very favorable.  We are going to continue the trastuzumab indefinitely. She is tolerating it well. Of course she only had her echo today so I do not yet have those results. She  will continue to have echocardiograms every 4-6 months at cardiology's discretion.  We are doing the trastuzumab on an every four-week schedule. She will be due for her next zolendronate dose in July.   I will see Christyne again with her September treatment dose. She knows to call for any problems that may develop before her next visit here. Chauncey Cruel, MD  09/22/2015 12:09 PM

## 2015-09-22 NOTE — Patient Instructions (Signed)
[facility] Discharge Instructions for Patients Receiving Chemotherapy  Today you received the following chemotherapy agents: Herceptin   To help prevent nausea and vomiting after your treatment, we encourage you to take your nausea medication as directed.    If you develop nausea and vomiting that is not controlled by your nausea medication, call the clinic.   BELOW ARE SYMPTOMS THAT SHOULD BE REPORTED IMMEDIATELY:  *FEVER GREATER THAN 100.5 F  *CHILLS WITH OR WITHOUT FEVER  NAUSEA AND VOMITING THAT IS NOT CONTROLLED WITH YOUR NAUSEA MEDICATION  *UNUSUAL SHORTNESS OF BREATH  *UNUSUAL BRUISING OR BLEEDING  TENDERNESS IN MOUTH AND THROAT WITH OR WITHOUT PRESENCE OF ULCERS  *URINARY PROBLEMS  *BOWEL PROBLEMS  UNUSUAL RASH Items with * indicate a potential emergency and should be followed up as soon as possible.  Feel free to call the clinic you have any questions or concerns. The clinic phone number is [phone].  Please show the CHEMO ALERT CARD at check-in to the Emergency Department and triage nurse.   

## 2015-09-23 ENCOUNTER — Telehealth: Payer: Self-pay | Admitting: *Deleted

## 2015-09-23 NOTE — Telephone Encounter (Signed)
Daughter-n-law [name] called for yesterday's urinalysis results.  Informed her results WNL.

## 2015-09-24 LAB — URINE CULTURE

## 2015-10-04 ENCOUNTER — Encounter: Payer: Self-pay | Admitting: Internal Medicine

## 2015-10-04 ENCOUNTER — Ambulatory Visit (INDEPENDENT_AMBULATORY_CARE_PROVIDER_SITE_OTHER): Payer: Medicare Other | Admitting: Internal Medicine

## 2015-10-04 VITALS — BP 160/100 | HR 63 | Temp 97.4°F | Ht 60.0 in | Wt 100.8 lb

## 2015-10-04 DIAGNOSIS — J069 Acute upper respiratory infection, unspecified: Secondary | ICD-10-CM

## 2015-10-04 NOTE — Progress Notes (Signed)
Pre visit review using our clinic review tool, if applicable. No additional management support is needed unless otherwise documented below in the visit note. 

## 2015-10-08 DIAGNOSIS — J069 Acute upper respiratory infection, unspecified: Secondary | ICD-10-CM | POA: Insufficient documentation

## 2015-10-08 NOTE — Progress Notes (Signed)
   Subjective:    Patient ID: [name], female    DOB: 1914-04-11, 82 y.o.   MRN: [id number]  HPI The patient is a 82 YO female coming in for cold symptoms. They started about 1 week ago. Started with congestion and cough and nasal drainage. She had some chills but not fever. NO ear pain or discharge. She is hard of hearing already. Now her symptoms are much improved but she wanted to come anyway to make sure she does not have pneumonia. She started taking otc allergy medicine like benadryl and cough syrup. Now almost gone. Some nasal congestion still.   Review of Systems  Constitutional: Negative for fever, activity change, appetite change, fatigue and unexpected weight change.  HENT: Positive for congestion, postnasal drip and rhinorrhea. Negative for ear discharge, ear pain, sinus pressure and sore throat.   Eyes: Negative.   Respiratory: Negative for cough, chest tightness, shortness of breath and wheezing.   Cardiovascular: Negative for chest pain, palpitations and leg swelling.  Gastrointestinal: Negative for nausea, abdominal pain, diarrhea, constipation and abdominal distention.  Musculoskeletal: Positive for arthralgias. Negative for myalgias, back pain and gait problem.  Skin: Negative.       Objective:   Physical Exam  Constitutional: She is oriented to person, place, and time. She appears well-developed and well-nourished.  Thin  HENT:  Head: Normocephalic and atraumatic.  Mild erythema oropharynx with clear drainage, no nasal crusting or sinus tenderness.   Eyes: EOM are normal.  Neck: Normal range of motion.  Cardiovascular: Normal rate and regular rhythm.   Pulmonary/Chest: Effort normal and breath sounds normal. No respiratory distress. She has no wheezes. She has no rales. She exhibits no tenderness.  Abdominal: Soft. Bowel sounds are normal. She exhibits no distension. There is no tenderness. There is no rebound.  Lymphadenopathy:    She has no cervical  adenopathy.  Neurological: She is alert and oriented to person, place, and time.  Skin: Skin is warm and dry.   Filed Vitals:   10/04/15 1504  BP: 160/100  Pulse: 63  Temp: 97.4 F (36.3 C)  TempSrc: Oral  Height: 5' (1.524 m)  Weight: 100 lb 12 oz (45.7 kg)  SpO2: 95%      Assessment & Plan:

## 2015-10-08 NOTE — Assessment & Plan Note (Addendum)
Due to her age this makes her high risk for subsequent pneumonia. Appears to be resolving and chest exam clear today. No indication for medication or further imaging. She is okay to take the benadryl if helpful and otc cold syrup is okay with her current medications. She knows not to take when not needed.

## 2015-10-19 ENCOUNTER — Ambulatory Visit (HOSPITAL_BASED_OUTPATIENT_CLINIC_OR_DEPARTMENT_OTHER): Payer: Medicare Other

## 2015-10-19 ENCOUNTER — Other Ambulatory Visit (HOSPITAL_BASED_OUTPATIENT_CLINIC_OR_DEPARTMENT_OTHER): Payer: Medicare Other

## 2015-10-19 VITALS — BP 177/93 | HR 61 | Resp 20

## 2015-10-19 DIAGNOSIS — Z5112 Encounter for antineoplastic immunotherapy: Secondary | ICD-10-CM

## 2015-10-19 DIAGNOSIS — C773 Secondary and unspecified malignant neoplasm of axilla and upper limb lymph nodes: Secondary | ICD-10-CM | POA: Diagnosis not present

## 2015-10-19 DIAGNOSIS — C50912 Malignant neoplasm of unspecified site of left female breast: Secondary | ICD-10-CM

## 2015-10-19 DIAGNOSIS — C50812 Malignant neoplasm of overlapping sites of left female breast: Secondary | ICD-10-CM

## 2015-10-19 DIAGNOSIS — C50919 Malignant neoplasm of unspecified site of unspecified female breast: Secondary | ICD-10-CM

## 2015-10-19 LAB — BASIC METABOLIC PANEL
Anion Gap: 8 mEq/L (ref 3–11)
BUN: 12.6 mg/dL (ref 7.0–26.0)
CALCIUM: 10.1 mg/dL (ref 8.4–10.4)
CHLORIDE: 110 meq/L — AB (ref 98–109)
CO2: 24 mEq/L (ref 22–29)
CREATININE: 0.9 mg/dL (ref 0.6–1.1)
EGFR: 56 mL/min/{1.73_m2} — ABNORMAL LOW (ref >90)
Glucose: 87 mg/dl (ref 70–140)
Potassium: 4.1 mEq/L (ref 3.5–5.1)
SODIUM: 142 meq/L (ref 136–145)

## 2015-10-19 MED ORDER — SODIUM CHLORIDE 0.9 % IV SOLN
6.0000 mg/kg | Freq: Once | INTRAVENOUS | Status: AC
  Administered 2015-10-19: 294 mg via INTRAVENOUS
  Filled 2015-10-19: qty 14

## 2015-10-19 MED ORDER — ACETAMINOPHEN 325 MG PO TABS
650.0000 mg | ORAL_TABLET | Freq: Once | ORAL | Status: AC
  Administered 2015-10-19: 650 mg via ORAL

## 2015-10-19 MED ORDER — ACETAMINOPHEN 325 MG PO TABS
ORAL_TABLET | ORAL | Status: AC
  Filled 2015-10-19: qty 2

## 2015-10-19 MED ORDER — SODIUM CHLORIDE 0.9 % IV SOLN
Freq: Once | INTRAVENOUS | Status: AC
  Administered 2015-10-19: 12:00:00 via INTRAVENOUS

## 2015-10-19 NOTE — Patient Instructions (Signed)
[facility] Discharge Instructions for Patients Receiving Chemotherapy  Today you received the following chemotherapy agents: Herceptin   To help prevent nausea and vomiting after your treatment, we encourage you to take your nausea medication as directed.    If you develop nausea and vomiting that is not controlled by your nausea medication, call the clinic.   BELOW ARE SYMPTOMS THAT SHOULD BE REPORTED IMMEDIATELY:  *FEVER GREATER THAN 100.5 F  *CHILLS WITH OR WITHOUT FEVER  NAUSEA AND VOMITING THAT IS NOT CONTROLLED WITH YOUR NAUSEA MEDICATION  *UNUSUAL SHORTNESS OF BREATH  *UNUSUAL BRUISING OR BLEEDING  TENDERNESS IN MOUTH AND THROAT WITH OR WITHOUT PRESENCE OF ULCERS  *URINARY PROBLEMS  *BOWEL PROBLEMS  UNUSUAL RASH Items with * indicate a potential emergency and should be followed up as soon as possible.  Feel free to call the clinic you have any questions or concerns. The clinic phone number is [phone].  Please show the CHEMO ALERT CARD at check-in to the Emergency Department and triage nurse.   

## 2015-10-20 ENCOUNTER — Ambulatory Visit: Payer: Medicare Other

## 2015-11-15 ENCOUNTER — Other Ambulatory Visit: Payer: Self-pay | Admitting: *Deleted

## 2015-11-15 DIAGNOSIS — C50812 Malignant neoplasm of overlapping sites of left female breast: Secondary | ICD-10-CM

## 2015-11-16 ENCOUNTER — Other Ambulatory Visit (HOSPITAL_BASED_OUTPATIENT_CLINIC_OR_DEPARTMENT_OTHER): Payer: Medicare Other

## 2015-11-16 ENCOUNTER — Ambulatory Visit (HOSPITAL_BASED_OUTPATIENT_CLINIC_OR_DEPARTMENT_OTHER): Payer: Medicare Other

## 2015-11-16 VITALS — BP 188/75 | HR 56 | Temp 97.8°F | Resp 18

## 2015-11-16 DIAGNOSIS — M810 Age-related osteoporosis without current pathological fracture: Secondary | ICD-10-CM

## 2015-11-16 DIAGNOSIS — Z5112 Encounter for antineoplastic immunotherapy: Secondary | ICD-10-CM | POA: Diagnosis present

## 2015-11-16 DIAGNOSIS — C50812 Malignant neoplasm of overlapping sites of left female breast: Secondary | ICD-10-CM

## 2015-11-16 DIAGNOSIS — C773 Secondary and unspecified malignant neoplasm of axilla and upper limb lymph nodes: Secondary | ICD-10-CM | POA: Diagnosis not present

## 2015-11-16 DIAGNOSIS — C50912 Malignant neoplasm of unspecified site of left female breast: Secondary | ICD-10-CM

## 2015-11-16 DIAGNOSIS — C50919 Malignant neoplasm of unspecified site of unspecified female breast: Secondary | ICD-10-CM

## 2015-11-16 LAB — COMPREHENSIVE METABOLIC PANEL
ALT: 16 U/L (ref 0–55)
AST: 28 U/L (ref 5–34)
Albumin: 4.1 g/dL (ref 3.5–5.0)
Alkaline Phosphatase: 61 U/L (ref 40–150)
Anion Gap: 9 mEq/L (ref 3–11)
BILIRUBIN TOTAL: 0.69 mg/dL (ref 0.20–1.20)
BUN: 13.7 mg/dL (ref 7.0–26.0)
CO2: 23 meq/L (ref 22–29)
CREATININE: 0.9 mg/dL (ref 0.6–1.1)
Calcium: 10.4 mg/dL (ref 8.4–10.4)
Chloride: 109 mEq/L (ref 98–109)
EGFR: 50 mL/min/{1.73_m2} — ABNORMAL LOW (ref >90)
GLUCOSE: 91 mg/dL (ref 70–140)
Potassium: 4.4 mEq/L (ref 3.5–5.1)
SODIUM: 141 meq/L (ref 136–145)
TOTAL PROTEIN: 7.3 g/dL (ref 6.4–8.3)

## 2015-11-16 LAB — CBC WITH DIFFERENTIAL/PLATELET
BASO%: 0.6 % (ref 0.0–2.0)
Basophils Absolute: 0 10*3/uL (ref 0.0–0.1)
EOS%: 3.3 % (ref 0.0–7.0)
Eosinophils Absolute: 0.1 10*3/uL (ref 0.0–0.5)
HCT: 43.1 % (ref 34.8–46.6)
HGB: 13.9 g/dL (ref 11.6–15.9)
LYMPH%: 15.4 % (ref 14.0–49.7)
MCH: 28.9 pg (ref 25.1–34.0)
MCHC: 32.1 g/dL (ref 31.5–36.0)
MCV: 89.9 fL (ref 79.5–101.0)
MONO#: 0.5 10*3/uL (ref 0.1–0.9)
MONO%: 11.2 % (ref 0.0–14.0)
NEUT%: 69.5 % (ref 38.4–76.8)
NEUTROS ABS: 3.2 10*3/uL (ref 1.5–6.5)
PLATELETS: 138 10*3/uL — AB (ref 145–400)
RBC: 4.8 10*6/uL (ref 3.70–5.45)
RDW: 14.9 % — ABNORMAL HIGH (ref 11.2–14.5)
WBC: 4.5 10*3/uL (ref 3.9–10.3)
lymph#: 0.7 10*3/uL — ABNORMAL LOW (ref 0.9–3.3)

## 2015-11-16 MED ORDER — SODIUM CHLORIDE 0.9 % IV SOLN
3.0000 mg | Freq: Once | INTRAVENOUS | Status: AC
  Administered 2015-11-16: 3 mg via INTRAVENOUS
  Filled 2015-11-16: qty 3.75

## 2015-11-16 MED ORDER — TRASTUZUMAB CHEMO 150 MG IV SOLR
6.0000 mg/kg | Freq: Once | INTRAVENOUS | Status: AC
  Administered 2015-11-16: 294 mg via INTRAVENOUS
  Filled 2015-11-16: qty 14

## 2015-11-16 MED ORDER — SODIUM CHLORIDE 0.9 % IV SOLN
Freq: Once | INTRAVENOUS | Status: AC
  Administered 2015-11-16: 12:00:00 via INTRAVENOUS

## 2015-11-16 MED ORDER — ACETAMINOPHEN 325 MG PO TABS
ORAL_TABLET | ORAL | Status: AC
  Filled 2015-11-16: qty 2

## 2015-11-16 MED ORDER — ACETAMINOPHEN 325 MG PO TABS
650.0000 mg | ORAL_TABLET | Freq: Once | ORAL | Status: AC
  Administered 2015-11-16: 650 mg via ORAL

## 2015-11-16 NOTE — Patient Instructions (Signed)
Trastuzumab injection for infusion What is this medicine? TRASTUZUMAB (tras TOO zoo mab) is a monoclonal antibody. It is used to treat breast cancer and stomach cancer. This medicine may be used for other purposes; ask your health care provider or pharmacist if you have questions. What should I tell my health care provider before I take this medicine? They need to know if you have any of these conditions: -heart disease -heart failure -infection (especially a virus infection such as chickenpox, cold sores, or herpes) -lung or breathing disease, like asthma -recent or ongoing radiation therapy -an unusual or allergic reaction to trastuzumab, benzyl alcohol, or other medications, foods, dyes, or preservatives -pregnant or trying to get pregnant -breast-feeding How should I use this medicine? This drug is given as an infusion into a vein. It is administered in a hospital or clinic by a specially trained health care professional. Talk to your pediatrician regarding the use of this medicine in children. This medicine is not approved for use in children. Overdosage: If you think you have taken too much of this medicine contact a poison control center or emergency room at once. NOTE: This medicine is only for you. Do not share this medicine with others. What if I miss a dose? It is important not to miss a dose. Call your doctor or health care professional if you are unable to keep an appointment. What may interact with this medicine? -doxorubicin -warfarin This list may not describe all possible interactions. Give your health care provider a list of all the medicines, herbs, non-prescription drugs, or dietary supplements you use. Also tell them if you smoke, drink alcohol, or use illegal drugs. Some items may interact with your medicine. What should I watch for while using this medicine? Visit your doctor for checks on your progress. Report any side effects. Continue your course of treatment even  though you feel ill unless your doctor tells you to stop. Call your doctor or health care professional for advice if you get a fever, chills or sore throat, or other symptoms of a cold or flu. Do not treat yourself. Try to avoid being around people who are sick. You may experience fever, chills and shaking during your first infusion. These effects are usually mild and can be treated with other medicines. Report any side effects during the infusion to your health care professional. Fever and chills usually do not happen with later infusions. Do not become pregnant while taking this medicine or for 7 months after stopping it. Women should inform their doctor if they wish to become pregnant or think they might be pregnant. Women of child-bearing potential will need to have a negative pregnancy test before starting this medicine. There is a potential for serious side effects to an unborn child. Talk to your health care professional or pharmacist for more information. Do not breast-feed an infant while taking this medicine or for 7 months after stopping it. Women must use effective birth control with this medicine. What side effects may I notice from receiving this medicine? Side effects that you should report to your doctor or other health care professional as soon as possible: -breathing difficulties -chest pain or palpitations -cough -dizziness or fainting -fever or chills, sore throat -skin rash, itching or hives -swelling of the legs or ankles -unusually weak or tired Side effects that usually do not require medical attention (report to your doctor or other health care professional if they continue or are bothersome): -loss of appetite -headache -muscle aches -nausea This   list may not describe all possible side effects. Call your doctor for medical advice about side effects. You may report side effects to FDA at 1-800-FDA-1088. Where should I keep my medicine? This drug is given in a hospital  or clinic and will not be stored at home. NOTE: This sheet is a summary. It may not cover all possible information. If you have questions about this medicine, talk to your doctor, pharmacist, or health care provider.    2016, Elsevier/Gold Standard. (2014-07-23 11:49:32)   

## 2015-11-17 ENCOUNTER — Ambulatory Visit: Payer: Medicare Other

## 2015-11-25 ENCOUNTER — Telehealth: Payer: Self-pay

## 2015-11-25 NOTE — Telephone Encounter (Signed)
Independent and supportive living physician report received and partially completed from [place] at [facility]. Forms placed on MD desk for completion and signature

## 2015-11-25 NOTE — Telephone Encounter (Signed)
Paperwork signed and original placed in cabinet for [name] [phone]) to pick up per his request. Gershon Mussel advised of same. Copy sent to scan

## 2015-12-14 ENCOUNTER — Other Ambulatory Visit: Payer: Self-pay | Admitting: *Deleted

## 2015-12-14 ENCOUNTER — Ambulatory Visit (HOSPITAL_BASED_OUTPATIENT_CLINIC_OR_DEPARTMENT_OTHER): Payer: Medicare Other

## 2015-12-14 ENCOUNTER — Other Ambulatory Visit (HOSPITAL_BASED_OUTPATIENT_CLINIC_OR_DEPARTMENT_OTHER): Payer: Medicare Other

## 2015-12-14 VITALS — BP 164/79 | HR 68 | Temp 98.8°F | Resp 18

## 2015-12-14 DIAGNOSIS — Z5112 Encounter for antineoplastic immunotherapy: Secondary | ICD-10-CM | POA: Diagnosis present

## 2015-12-14 DIAGNOSIS — Z5111 Encounter for antineoplastic chemotherapy: Secondary | ICD-10-CM

## 2015-12-14 DIAGNOSIS — C773 Secondary and unspecified malignant neoplasm of axilla and upper limb lymph nodes: Secondary | ICD-10-CM

## 2015-12-14 DIAGNOSIS — C50912 Malignant neoplasm of unspecified site of left female breast: Secondary | ICD-10-CM

## 2015-12-14 DIAGNOSIS — Z79899 Other long term (current) drug therapy: Secondary | ICD-10-CM

## 2015-12-14 DIAGNOSIS — Z5181 Encounter for therapeutic drug level monitoring: Secondary | ICD-10-CM

## 2015-12-14 DIAGNOSIS — C50812 Malignant neoplasm of overlapping sites of left female breast: Secondary | ICD-10-CM

## 2015-12-14 LAB — COMPREHENSIVE METABOLIC PANEL
ALBUMIN: 3.9 g/dL (ref 3.5–5.0)
ALK PHOS: 50 U/L (ref 40–150)
ALT: 18 U/L (ref 0–55)
AST: 29 U/L (ref 5–34)
Anion Gap: 8 mEq/L (ref 3–11)
BILIRUBIN TOTAL: 0.69 mg/dL (ref 0.20–1.20)
BUN: 12.8 mg/dL (ref 7.0–26.0)
CO2: 24 meq/L (ref 22–29)
CREATININE: 0.8 mg/dL (ref 0.6–1.1)
Calcium: 10.1 mg/dL (ref 8.4–10.4)
Chloride: 110 mEq/L — ABNORMAL HIGH (ref 98–109)
EGFR: 57 mL/min/{1.73_m2} — AB (ref >90)
GLUCOSE: 94 mg/dL (ref 70–140)
Potassium: 4.1 mEq/L (ref 3.5–5.1)
SODIUM: 142 meq/L (ref 136–145)
TOTAL PROTEIN: 7 g/dL (ref 6.4–8.3)

## 2015-12-14 LAB — CBC WITH DIFFERENTIAL/PLATELET
BASO%: 0.7 % (ref 0.0–2.0)
Basophils Absolute: 0 10*3/uL (ref 0.0–0.1)
EOS ABS: 0.1 10*3/uL (ref 0.0–0.5)
EOS%: 2.5 % (ref 0.0–7.0)
HCT: 42.6 % (ref 34.8–46.6)
HEMOGLOBIN: 13.8 g/dL (ref 11.6–15.9)
LYMPH%: 22.9 % (ref 14.0–49.7)
MCH: 29.6 pg (ref 25.1–34.0)
MCHC: 32.4 g/dL (ref 31.5–36.0)
MCV: 91.2 fL (ref 79.5–101.0)
MONO#: 0.4 10*3/uL (ref 0.1–0.9)
MONO%: 10.4 % (ref 0.0–14.0)
NEUT%: 63.5 % (ref 38.4–76.8)
NEUTROS ABS: 2.6 10*3/uL (ref 1.5–6.5)
Platelets: 135 10*3/uL — ABNORMAL LOW (ref 145–400)
RBC: 4.67 10*6/uL (ref 3.70–5.45)
RDW: 15.2 % — AB (ref 11.2–14.5)
WBC: 4 10*3/uL (ref 3.9–10.3)
lymph#: 0.9 10*3/uL (ref 0.9–3.3)

## 2015-12-14 MED ORDER — ACETAMINOPHEN 325 MG PO TABS
ORAL_TABLET | ORAL | Status: AC
  Filled 2015-12-14: qty 2

## 2015-12-14 MED ORDER — ACETAMINOPHEN 325 MG PO TABS
650.0000 mg | ORAL_TABLET | Freq: Once | ORAL | Status: AC
  Administered 2015-12-14: 650 mg via ORAL

## 2015-12-14 MED ORDER — SODIUM CHLORIDE 0.9 % IV SOLN
Freq: Once | INTRAVENOUS | Status: AC
  Administered 2015-12-14: 13:00:00 via INTRAVENOUS

## 2015-12-14 MED ORDER — TRASTUZUMAB CHEMO 150 MG IV SOLR
6.0000 mg/kg | Freq: Once | INTRAVENOUS | Status: AC
  Administered 2015-12-14: 294 mg via INTRAVENOUS
  Filled 2015-12-14: qty 14

## 2015-12-14 NOTE — Patient Instructions (Signed)
Trastuzumab injection for infusion What is this medicine? TRASTUZUMAB (tras TOO zoo mab) is a monoclonal antibody. It is used to treat breast cancer and stomach cancer. This medicine may be used for other purposes; ask your health care provider or pharmacist if you have questions. What should I tell my health care provider before I take this medicine? They need to know if you have any of these conditions: -heart disease -heart failure -infection (especially a virus infection such as chickenpox, cold sores, or herpes) -lung or breathing disease, like asthma -recent or ongoing radiation therapy -an unusual or allergic reaction to trastuzumab, benzyl alcohol, or other medications, foods, dyes, or preservatives -pregnant or trying to get pregnant -breast-feeding How should I use this medicine? This drug is given as an infusion into a vein. It is administered in a hospital or clinic by a specially trained health care professional. Talk to your pediatrician regarding the use of this medicine in children. This medicine is not approved for use in children. Overdosage: If you think you have taken too much of this medicine contact a poison control center or emergency room at once. NOTE: This medicine is only for you. Do not share this medicine with others. What if I miss a dose? It is important not to miss a dose. Call your doctor or health care professional if you are unable to keep an appointment. What may interact with this medicine? -doxorubicin -warfarin This list may not describe all possible interactions. Give your health care provider a list of all the medicines, herbs, non-prescription drugs, or dietary supplements you use. Also tell them if you smoke, drink alcohol, or use illegal drugs. Some items may interact with your medicine. What should I watch for while using this medicine? Visit your doctor for checks on your progress. Report any side effects. Continue your course of treatment even  though you feel ill unless your doctor tells you to stop. Call your doctor or health care professional for advice if you get a fever, chills or sore throat, or other symptoms of a cold or flu. Do not treat yourself. Try to avoid being around people who are sick. You may experience fever, chills and shaking during your first infusion. These effects are usually mild and can be treated with other medicines. Report any side effects during the infusion to your health care professional. Fever and chills usually do not happen with later infusions. Do not become pregnant while taking this medicine or for 7 months after stopping it. Women should inform their doctor if they wish to become pregnant or think they might be pregnant. Women of child-bearing potential will need to have a negative pregnancy test before starting this medicine. There is a potential for serious side effects to an unborn child. Talk to your health care professional or pharmacist for more information. Do not breast-feed an infant while taking this medicine or for 7 months after stopping it. Women must use effective birth control with this medicine. What side effects may I notice from receiving this medicine? Side effects that you should report to your doctor or other health care professional as soon as possible: -breathing difficulties -chest pain or palpitations -cough -dizziness or fainting -fever or chills, sore throat -skin rash, itching or hives -swelling of the legs or ankles -unusually weak or tired Side effects that usually do not require medical attention (report to your doctor or other health care professional if they continue or are bothersome): -loss of appetite -headache -muscle aches -nausea This   list may not describe all possible side effects. Call your doctor for medical advice about side effects. You may report side effects to FDA at 1-800-FDA-1088. Where should I keep my medicine? This drug is given in a hospital  or clinic and will not be stored at home. NOTE: This sheet is a summary. It may not cover all possible information. If you have questions about this medicine, talk to your doctor, pharmacist, or health care provider.    2016, Elsevier/Gold Standard. (2014-07-23 11:49:32)   

## 2015-12-15 ENCOUNTER — Ambulatory Visit: Payer: Medicare Other

## 2016-01-11 ENCOUNTER — Other Ambulatory Visit: Payer: Self-pay | Admitting: *Deleted

## 2016-01-11 DIAGNOSIS — C50812 Malignant neoplasm of overlapping sites of left female breast: Secondary | ICD-10-CM

## 2016-01-12 ENCOUNTER — Ambulatory Visit (HOSPITAL_BASED_OUTPATIENT_CLINIC_OR_DEPARTMENT_OTHER): Payer: Medicare Other

## 2016-01-12 ENCOUNTER — Ambulatory Visit (HOSPITAL_BASED_OUTPATIENT_CLINIC_OR_DEPARTMENT_OTHER): Payer: Medicare Other | Admitting: Oncology

## 2016-01-12 ENCOUNTER — Other Ambulatory Visit (HOSPITAL_BASED_OUTPATIENT_CLINIC_OR_DEPARTMENT_OTHER): Payer: Medicare Other

## 2016-01-12 ENCOUNTER — Ambulatory Visit: Payer: Medicare Other | Admitting: Internal Medicine

## 2016-01-12 VITALS — BP 191/82 | HR 60 | Temp 97.7°F | Resp 18 | Ht 60.0 in | Wt 99.3 lb

## 2016-01-12 DIAGNOSIS — C50812 Malignant neoplasm of overlapping sites of left female breast: Secondary | ICD-10-CM

## 2016-01-12 DIAGNOSIS — Z171 Estrogen receptor negative status [ER-]: Secondary | ICD-10-CM

## 2016-01-12 DIAGNOSIS — Z5112 Encounter for antineoplastic immunotherapy: Secondary | ICD-10-CM | POA: Diagnosis present

## 2016-01-12 DIAGNOSIS — M810 Age-related osteoporosis without current pathological fracture: Secondary | ICD-10-CM | POA: Diagnosis not present

## 2016-01-12 DIAGNOSIS — C773 Secondary and unspecified malignant neoplasm of axilla and upper limb lymph nodes: Secondary | ICD-10-CM

## 2016-01-12 DIAGNOSIS — G4762 Sleep related leg cramps: Secondary | ICD-10-CM | POA: Diagnosis not present

## 2016-01-12 LAB — CBC WITH DIFFERENTIAL/PLATELET
BASO%: 0.7 % (ref 0.0–2.0)
Basophils Absolute: 0 10*3/uL (ref 0.0–0.1)
EOS%: 3 % (ref 0.0–7.0)
Eosinophils Absolute: 0.1 10*3/uL (ref 0.0–0.5)
HEMATOCRIT: 45.1 % (ref 34.8–46.6)
HEMOGLOBIN: 14.5 g/dL (ref 11.6–15.9)
LYMPH#: 0.7 10*3/uL — AB (ref 0.9–3.3)
LYMPH%: 16.7 % (ref 14.0–49.7)
MCH: 29.1 pg (ref 25.1–34.0)
MCHC: 32.1 g/dL (ref 31.5–36.0)
MCV: 90.8 fL (ref 79.5–101.0)
MONO#: 0.4 10*3/uL (ref 0.1–0.9)
MONO%: 11 % (ref 0.0–14.0)
NEUT%: 68.6 % (ref 38.4–76.8)
NEUTROS ABS: 2.8 10*3/uL (ref 1.5–6.5)
PLATELETS: 141 10*3/uL — AB (ref 145–400)
RBC: 4.96 10*6/uL (ref 3.70–5.45)
RDW: 15.5 % — AB (ref 11.2–14.5)
WBC: 4 10*3/uL (ref 3.9–10.3)

## 2016-01-12 LAB — COMPREHENSIVE METABOLIC PANEL
ALT: 18 U/L (ref 0–55)
ANION GAP: 8 meq/L (ref 3–11)
AST: 29 U/L (ref 5–34)
Albumin: 4 g/dL (ref 3.5–5.0)
Alkaline Phosphatase: 57 U/L (ref 40–150)
BILIRUBIN TOTAL: 0.66 mg/dL (ref 0.20–1.20)
BUN: 15.3 mg/dL (ref 7.0–26.0)
CALCIUM: 10.1 mg/dL (ref 8.4–10.4)
CO2: 24 mEq/L (ref 22–29)
CREATININE: 0.8 mg/dL (ref 0.6–1.1)
Chloride: 110 mEq/L — ABNORMAL HIGH (ref 98–109)
EGFR: 60 mL/min/{1.73_m2} — ABNORMAL LOW (ref >90)
Glucose: 93 mg/dl (ref 70–140)
Potassium: 4.1 mEq/L (ref 3.5–5.1)
Sodium: 143 mEq/L (ref 136–145)
TOTAL PROTEIN: 7.2 g/dL (ref 6.4–8.3)

## 2016-01-12 MED ORDER — TRASTUZUMAB CHEMO 150 MG IV SOLR
6.0000 mg/kg | Freq: Once | INTRAVENOUS | Status: AC
  Administered 2016-01-12: 294 mg via INTRAVENOUS
  Filled 2016-01-12: qty 14

## 2016-01-12 MED ORDER — ACETAMINOPHEN 325 MG PO TABS
650.0000 mg | ORAL_TABLET | Freq: Once | ORAL | Status: AC
  Administered 2016-01-12: 650 mg via ORAL

## 2016-01-12 MED ORDER — SODIUM CHLORIDE 0.9 % IV SOLN
Freq: Once | INTRAVENOUS | Status: AC
  Administered 2016-01-12: 12:00:00 via INTRAVENOUS

## 2016-01-12 MED ORDER — ACETAMINOPHEN 325 MG PO TABS
ORAL_TABLET | ORAL | Status: AC
  Filled 2016-01-12: qty 2

## 2016-01-12 NOTE — Progress Notes (Signed)
[facility]  [phone] [phone]  OFFICE PROGRESS NOTE   ID: CAREL SCHNEE   DOB: June 20, 1913  MR#: [id number]  GHW#:[id number]  Cc:  Azalee Course, Glori Bickers  CHIEF COMPLAINT: metastatic breast cancer  CURRENT TREATMENT: trastuzumab every 4 weeks; zolendronate every 12 weeks  BREAST CANCER HISTORY: From the original intake note:  Mrs. Farrior's gynecologist, Dr. Josefa Half, palpated a mass in the patient's left breast in 2004. The patient has a daughter, who is a Marine scientist, formerly I believe an oncology nurse, currently a pulmonary nurse in [place], so the patient went there for her care, and while I do not have all the workup, I do have the pathology report from October 01, 2002, which was her left lumpectomy and sentinel lymph node sampling under Dr. Kemper Durie. This showed [phone]) a 2.5 cm infiltrating ductal carcinoma with lobular features (the cells were E-cadherin positive), involving one of three sentinel lymph nodes. The anterior margin was close at less than 1 mm. The patient saw Dr. Arloa Koh here, and the option of completion mastectomy versus radiotherapy alone was discussed. He felt, and I would probably have agreed, that despite the very close margin, which was nevertheless technically negative, proceeding to radiation without "clearing the margin" further would be adequate. Accordingly, the patient did receive a total of 5040 cGy with an additional 1000 cGy boost to the left breast between September 11 and February 24, 2003.   The patient then did well, but in 2007 developed some redness and bumpiness around the left areola. She brought this to Dr. Ermelinda Das attention, and apparently an initial biopsy was negative, as was a mammogram, however, with further development of the change, the patient had a biopsy under anesthesia, and this apparently was positive (I do not have those records). With this information, she proceeded to  left mastectomy on February 26, 2006. The pathology there ([id number]) showed dermal involvement by adenocarcinoma, with pagetoid spread and lymphatic space invasion. The closest margin being 1.2 cm from the inferior margin. Everything else being amply negative. There were no discrete masses or lesions identified in the left breast. The patient subsequently underwent left mastectomy.   There were a series of further skin recurrences, first in November of 2008. Initially, these were resected, but later there were too many lesions to resect, in addition to a large left axillary lymph node. This was biopsied in January 2011, showing the tumor now to be HER-2/neu positive. Patient has been on anti-HER-2/neu treatments since January 2011, initially with trastuzumab plus lapatinib. Lapatinib was discontinued in April 2011 secondary to side effects. Continues now to receive trastuzumab on a q. 3 week basis. Also receives a zoledronic acid every 3 months.    INTERVAL HISTORY: Shanekqua returns today for follow up of her recurrent breast cancer, accompanied by her daughter-in-law. He receives trastuzumab every 28 days. She is tolerating that well, with her most recent echocardiogram in May showing a well-preserved ejection fraction. She will be due for her next zolendronate dose in October. She tolerates that also with no side effects.  REVIEW OF SYSTEMS: Teryl is out raged because her son has moved her out of her own apartment to [employer]. She does not like it there. She feels they have taken away her autonomy. She hates the fact that she can't eat whenever she places instead of having to go down for meals at specific times. Aside from these issues a detailed review of systems today was stable  PAST MEDICAL  HISTORY: Past Medical History:  Diagnosis Date  . Breast cancer ([place])   . CAD (coronary artery disease)    cath w/ trivial disease  . Cancer ([place])    breast  . Cataract   . Diverticulosis of colon    . Macular degeneration   . Osteoporosis    s/p compression fx T12    PAST SURGICAL HISTORY: Past Surgical History:  Procedure Laterality Date  . ABDOMINAL HYSTERECTOMY    . BREAST LUMPECTOMY    . excision for Paget's vulvar    . FLEXIBLE SIGMOIDOSCOPY N/A 06/27/2012   Procedure: FLEXIBLE SIGMOIDOSCOPY;  Surgeon: Lafayette Dragon, MD;  Location: [facility];  Service: Endoscopy;  Laterality: N/A;  . MASTECTOMY     left  . vertbroplasty     T12    FAMILY HISTORY The patient's father died in an automobile accident at 65, the patient's mother from a stroke at 25. The patient had one brother who died from causes unknown to her at the age of 15, and a sister who had "lots of problems" and died at the age of 46, but there is no history of breast or ovarian cancer in the family.   GYNECOLOGIC HISTORY: She is GX P3. She took hormone replacement therapy briefly after menopause.   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 She has been a widow since 1997; lives independently, drives, and does all of her activities of daily living. Her daughter, [name], is a Marine scientist in [place]. Her daughter, [name], lives in [place].[name], who frequently comes with the patient to medical visits, is married to the patient's son, who is also her power of attorney. He works for the [employer]. The patient has six grandchildren and three great-grandchildren. She attends [employer].    ADVANCED DIRECTIVES: In place  HEALTH MAINTENANCE: Social History  Substance Use Topics  . Smoking status: Former Smoker    Quit date: 05/01/1990  . Smokeless tobacco: Never Used  . Alcohol use No     Colonoscopy: 02/06/2002  PAP: Not on file  Bone density: Not on file  Lipid panel: Not on file  Allergies  Allergen Reactions  . Solifenacin Succinate     REACTION: "sick" nausea    Current Outpatient Prescriptions   Medication Sig Dispense Refill  . acetaminophen (TYLENOL) 500 MG tablet Take 1,000 mg by mouth daily.     Marland Kitchen losartan (COZAAR) 25 MG tablet TAKE ONE TABLET BY MOUTH ONCE DAILY. 90 tablet 6  . mirabegron ER (MYRBETRIQ) 50 MG TB24 tablet Take 1 tablet (50 mg total) by mouth daily. (Patient not taking: Reported on 10/04/2015) 30 tablet 6  . Multiple Vitamins-Minerals (PRESERVISION AREDS 2 PO) Take 1 tablet by mouth 2 (two) times daily.    Marland Kitchen triamcinolone cream (KENALOG) 0.1 % Apply 1 application topically 2 (two) times daily as needed. 80 g 1   No current facility-administered medications for this visit.     OBJECTIVE: Elderly white woman Who appears younger than stated age 32:   01/12/16 1053  BP: (!) 191/82  Pulse: 60  Resp: 18  Temp: 97.7 F (36.5 C)     Body mass index is 19.39 kg/m.    ECOG FS: 1  Sclerae unicteric, pupils round and equal Oropharynx clear and moist-- no thrush or other lesions No cervical or supraclavicular adenopathy Lungs no rales or rhonchi Heart regular rate and rhythm Abd  soft, nontender, positive bowel sounds MSK no focal spinal tenderness, no upper extremity lymphedema Neuro: nonfocal, well oriented, appropriate affect Breasts: The right breast is unremarkable. The left breast is status post mastectomy. There is currently no evidence of chest wall recurrence. The left axilla is benign.    LAB RESULTS: Lab Results  Component Value Date   WBC 4.0 01/12/2016   NEUTROABS 2.8 01/12/2016   HGB 14.5 01/12/2016   HCT 45.1 01/12/2016   MCV 90.8 01/12/2016   PLT 141 (L) 01/12/2016      Chemistry      Component Value Date/Time   NA 143 01/12/2016 1026   K 4.1 01/12/2016 1026   CL 104 12/09/2013 1245   CL 106 10/15/2012 1136   CO2 24 01/12/2016 1026   BUN 15.3 01/12/2016 1026   CREATININE 0.8 01/12/2016 1026      Component Value Date/Time   CALCIUM 10.1 01/12/2016 1026   ALKPHOS 57 01/12/2016 1026   AST 29 01/12/2016 1026   ALT 18 01/12/2016  1026   BILITOT 0.66 01/12/2016 1026       Lab Results  Component Value Date   LABCA2 17 09/26/2011     STUDIES:  No results found.  ASSESSMENT: Ms. Frese is a 82 y.o.  [place], [place] woman:  (1) Status post leftBreast overlapping sites lumpectomy and sentinel lymph node dissection in 09/2002 for a T2 N1 (Stage II) invasive ductal carcinoma, triple negative, status post radiation therapy completed in 01/2003.  (2) Local recurrence around the areola 01/2006, leading to mastectomy.  (3) A series of further skin recurrences first noted in 03/2007. Initially these were resected, later with too many lesions to resect; there was also a large left axillary lymph node which was biopsied in 04/2009 showing the tumor now to be HER-2 positive.   (4) Since 04/2009 she has been on anti-HER-2 treatment, initially with trastuzumab plus lapatinib, with a lapatinib discontinued 07/2009 because of side effects.   (5) Recurrence of vulvar Paget's disease, originally diagnosed and resected in 2004 and recurred in 02/2012.  Followed by Dr. Paula Compton and Dr. Marti Sleigh.  (6) Continues to receive trastuzumab every 4 weeks and zoledronic acid every 3 months.  (a)  Echocardiogram Repeated 09/22/2015, shows an ejection fraction of 55-60%  (7) Nocturnal leg cramping  PLAN: Ann-Marie is now 9 years out from her most recent chest wall recurrence with no evidence of disease activity. This is very favorable.  We are continuing the trastuzumab indefinitely. She receives that every 28 days. We are doing echocardiograms every 6 months with the next one due in November.  I understand her anger at being moved out of her independent apartment, but this is a part of life and it is an important transition for her. I urged her to make the best event, and also since she is going to continue to go to [place], to give [employer] a chance as well and participate in the activities available to  her in both churches.  She will return 100 03/25/2016.  She will return to see me again in December. She knows to call for any problems that may develop before that visit.Marland Kitchen Chauncey Cruel, MD  01/12/2016 11:14 AM

## 2016-01-12 NOTE — Patient Instructions (Signed)
Trastuzumab injection for infusion What is this medicine? TRASTUZUMAB (tras TOO zoo mab) is a monoclonal antibody. It is used to treat breast cancer and stomach cancer. This medicine may be used for other purposes; ask your health care provider or pharmacist if you have questions. What should I tell my health care provider before I take this medicine? They need to know if you have any of these conditions: -heart disease -heart failure -infection (especially a virus infection such as chickenpox, cold sores, or herpes) -lung or breathing disease, like asthma -recent or ongoing radiation therapy -an unusual or allergic reaction to trastuzumab, benzyl alcohol, or other medications, foods, dyes, or preservatives -pregnant or trying to get pregnant -breast-feeding How should I use this medicine? This drug is given as an infusion into a vein. It is administered in a hospital or clinic by a specially trained health care professional. Talk to your pediatrician regarding the use of this medicine in children. This medicine is not approved for use in children. Overdosage: If you think you have taken too much of this medicine contact a poison control center or emergency room at once. NOTE: This medicine is only for you. Do not share this medicine with others. What if I miss a dose? It is important not to miss a dose. Call your doctor or health care professional if you are unable to keep an appointment. What may interact with this medicine? -doxorubicin -warfarin This list may not describe all possible interactions. Give your health care provider a list of all the medicines, herbs, non-prescription drugs, or dietary supplements you use. Also tell them if you smoke, drink alcohol, or use illegal drugs. Some items may interact with your medicine. What should I watch for while using this medicine? Visit your doctor for checks on your progress. Report any side effects. Continue your course of treatment even  though you feel ill unless your doctor tells you to stop. Call your doctor or health care professional for advice if you get a fever, chills or sore throat, or other symptoms of a cold or flu. Do not treat yourself. Try to avoid being around people who are sick. You may experience fever, chills and shaking during your first infusion. These effects are usually mild and can be treated with other medicines. Report any side effects during the infusion to your health care professional. Fever and chills usually do not happen with later infusions. Do not become pregnant while taking this medicine or for 7 months after stopping it. Women should inform their doctor if they wish to become pregnant or think they might be pregnant. Women of child-bearing potential will need to have a negative pregnancy test before starting this medicine. There is a potential for serious side effects to an unborn child. Talk to your health care professional or pharmacist for more information. Do not breast-feed an infant while taking this medicine or for 7 months after stopping it. Women must use effective birth control with this medicine. What side effects may I notice from receiving this medicine? Side effects that you should report to your doctor or other health care professional as soon as possible: -breathing difficulties -chest pain or palpitations -cough -dizziness or fainting -fever or chills, sore throat -skin rash, itching or hives -swelling of the legs or ankles -unusually weak or tired Side effects that usually do not require medical attention (report to your doctor or other health care professional if they continue or are bothersome): -loss of appetite -headache -muscle aches -nausea This   list may not describe all possible side effects. Call your doctor for medical advice about side effects. You may report side effects to FDA at 1-800-FDA-1088. Where should I keep my medicine? This drug is given in a hospital  or clinic and will not be stored at home. NOTE: This sheet is a summary. It may not cover all possible information. If you have questions about this medicine, talk to your doctor, pharmacist, or health care provider.    2016, Elsevier/Gold Standard. (2014-07-23 11:49:32)   

## 2016-01-19 ENCOUNTER — Ambulatory Visit (INDEPENDENT_AMBULATORY_CARE_PROVIDER_SITE_OTHER): Payer: Medicare Other

## 2016-01-19 ENCOUNTER — Ambulatory Visit: Payer: Medicare Other | Admitting: Internal Medicine

## 2016-01-19 DIAGNOSIS — Z23 Encounter for immunization: Secondary | ICD-10-CM | POA: Diagnosis not present

## 2016-01-24 ENCOUNTER — Ambulatory Visit: Payer: Medicare Other | Admitting: Internal Medicine

## 2016-02-09 ENCOUNTER — Ambulatory Visit (HOSPITAL_BASED_OUTPATIENT_CLINIC_OR_DEPARTMENT_OTHER): Payer: Medicare Other

## 2016-02-09 ENCOUNTER — Other Ambulatory Visit (HOSPITAL_BASED_OUTPATIENT_CLINIC_OR_DEPARTMENT_OTHER): Payer: Medicare Other

## 2016-02-09 VITALS — BP 162/78 | HR 53 | Resp 18

## 2016-02-09 DIAGNOSIS — C50812 Malignant neoplasm of overlapping sites of left female breast: Secondary | ICD-10-CM

## 2016-02-09 DIAGNOSIS — M810 Age-related osteoporosis without current pathological fracture: Secondary | ICD-10-CM | POA: Diagnosis not present

## 2016-02-09 DIAGNOSIS — C773 Secondary and unspecified malignant neoplasm of axilla and upper limb lymph nodes: Secondary | ICD-10-CM

## 2016-02-09 DIAGNOSIS — C519 Malignant neoplasm of vulva, unspecified: Secondary | ICD-10-CM

## 2016-02-09 DIAGNOSIS — C4499 Other specified malignant neoplasm of skin, unspecified: Secondary | ICD-10-CM

## 2016-02-09 DIAGNOSIS — Z5112 Encounter for antineoplastic immunotherapy: Secondary | ICD-10-CM

## 2016-02-09 LAB — COMPREHENSIVE METABOLIC PANEL
ALBUMIN: 4 g/dL (ref 3.5–5.0)
ALK PHOS: 61 U/L (ref 40–150)
ALT: 16 U/L (ref 0–55)
AST: 27 U/L (ref 5–34)
Anion Gap: 9 mEq/L (ref 3–11)
BUN: 12 mg/dL (ref 7.0–26.0)
CO2: 23 meq/L (ref 22–29)
Calcium: 9.7 mg/dL (ref 8.4–10.4)
Chloride: 112 mEq/L — ABNORMAL HIGH (ref 98–109)
Creatinine: 0.8 mg/dL (ref 0.6–1.1)
EGFR: 63 mL/min/{1.73_m2} — AB (ref >90)
GLUCOSE: 88 mg/dL (ref 70–140)
POTASSIUM: 4 meq/L (ref 3.5–5.1)
SODIUM: 144 meq/L (ref 136–145)
Total Bilirubin: 0.61 mg/dL (ref 0.20–1.20)
Total Protein: 7.1 g/dL (ref 6.4–8.3)

## 2016-02-09 LAB — CBC WITH DIFFERENTIAL/PLATELET
BASO%: 0.7 % (ref 0.0–2.0)
Basophils Absolute: 0 10*3/uL (ref 0.0–0.1)
EOS%: 3.1 % (ref 0.0–7.0)
Eosinophils Absolute: 0.1 10*3/uL (ref 0.0–0.5)
HCT: 42.7 % (ref 34.8–46.6)
HEMOGLOBIN: 13.8 g/dL (ref 11.6–15.9)
LYMPH%: 17.5 % (ref 14.0–49.7)
MCH: 29.7 pg (ref 25.1–34.0)
MCHC: 32.3 g/dL (ref 31.5–36.0)
MCV: 92 fL (ref 79.5–101.0)
MONO#: 0.5 10*3/uL (ref 0.1–0.9)
MONO%: 11.2 % (ref 0.0–14.0)
NEUT%: 67.5 % (ref 38.4–76.8)
NEUTROS ABS: 3 10*3/uL (ref 1.5–6.5)
Platelets: 134 10*3/uL — ABNORMAL LOW (ref 145–400)
RBC: 4.64 10*6/uL (ref 3.70–5.45)
RDW: 14.8 % — AB (ref 11.2–14.5)
WBC: 4.5 10*3/uL (ref 3.9–10.3)
lymph#: 0.8 10*3/uL — ABNORMAL LOW (ref 0.9–3.3)

## 2016-02-09 MED ORDER — ACETAMINOPHEN 325 MG PO TABS
650.0000 mg | ORAL_TABLET | Freq: Once | ORAL | Status: AC
  Administered 2016-02-09: 650 mg via ORAL

## 2016-02-09 MED ORDER — SODIUM CHLORIDE 0.9 % IV SOLN
Freq: Once | INTRAVENOUS | Status: AC
  Administered 2016-02-09: 12:00:00 via INTRAVENOUS

## 2016-02-09 MED ORDER — TRASTUZUMAB CHEMO 150 MG IV SOLR
6.0000 mg/kg | Freq: Once | INTRAVENOUS | Status: AC
  Administered 2016-02-09: 294 mg via INTRAVENOUS
  Filled 2016-02-09: qty 14

## 2016-02-09 MED ORDER — ACETAMINOPHEN 325 MG PO TABS
ORAL_TABLET | ORAL | Status: AC
  Filled 2016-02-09: qty 2

## 2016-02-09 MED ORDER — SODIUM CHLORIDE 0.9 % IV SOLN
3.0000 mg | Freq: Once | INTRAVENOUS | Status: AC
  Administered 2016-02-09: 3 mg via INTRAVENOUS
  Filled 2016-02-09: qty 3.75

## 2016-02-09 NOTE — Patient Instructions (Signed)
[facility] Discharge Instructions for Patients Receiving Chemotherapy  Today you received the following chemotherapy agents:  Herceptin  To help prevent nausea and vomiting after your treatment, we encourage you to take your nausea medication as prescribed.   If you develop nausea and vomiting that is not controlled by your nausea medication, call the clinic.   BELOW ARE SYMPTOMS THAT SHOULD BE REPORTED IMMEDIATELY:  *FEVER GREATER THAN 100.5 F  *CHILLS WITH OR WITHOUT FEVER  NAUSEA AND VOMITING THAT IS NOT CONTROLLED WITH YOUR NAUSEA MEDICATION  *UNUSUAL SHORTNESS OF BREATH  *UNUSUAL BRUISING OR BLEEDING  TENDERNESS IN MOUTH AND THROAT WITH OR WITHOUT PRESENCE OF ULCERS  *URINARY PROBLEMS  *BOWEL PROBLEMS  UNUSUAL RASH Items with * indicate a potential emergency and should be followed up as soon as possible.  Feel free to call the clinic you have any questions or concerns. The clinic phone number is [phone].  Please show the CHEMO ALERT CARD at check-in to the Emergency Department and triage nurse.  Zoledronic Acid injection (Hypercalcemia, Oncology) What is this medicine? ZOLEDRONIC ACID (ZOE le dron ik AS id) lowers the amount of calcium loss from bone. It is used to treat too much calcium in your blood from cancer. It is also used to prevent complications of cancer that has spread to the bone. This medicine may be used for other purposes; ask your health care provider or pharmacist if you have questions. What should I tell my health care provider before I take this medicine? They need to know if you have any of these conditions: -aspirin-sensitive asthma -cancer, especially if you are receiving medicines used to treat cancer -dental disease or wear dentures -infection -kidney disease -receiving corticosteroids like dexamethasone or prednisone -an unusual or allergic reaction to zoledronic acid, other medicines, foods, dyes, or  preservatives -pregnant or trying to get pregnant -breast-feeding How should I use this medicine? This medicine is for infusion into a vein. It is given by a health care professional in a hospital or clinic setting. Talk to your pediatrician regarding the use of this medicine in children. Special care may be needed. Overdosage: If you think you have taken too much of this medicine contact a poison control center or emergency room at once. NOTE: This medicine is only for you. Do not share this medicine with others. What if I miss a dose? It is important not to miss your dose. Call your doctor or health care professional if you are unable to keep an appointment. What may interact with this medicine? -certain antibiotics given by injection -NSAIDs, medicines for pain and inflammation, like ibuprofen or naproxen -some diuretics like bumetanide, furosemide -teriparatide -thalidomide This list may not describe all possible interactions. Give your health care provider a list of all the medicines, herbs, non-prescription drugs, or dietary supplements you use. Also tell them if you smoke, drink alcohol, or use illegal drugs. Some items may interact with your medicine. What should I watch for while using this medicine? Visit your doctor or health care professional for regular checkups. It may be some time before you see the benefit from this medicine. Do not stop taking your medicine unless your doctor tells you to. Your doctor may order blood tests or other tests to see how you are doing. Women should inform their doctor if they wish to become pregnant or think they might be pregnant. There is a potential for serious side effects to an unborn child. Talk to your health care professional   or pharmacist for more information. You should make sure that you get enough calcium and vitamin D while you are taking this medicine. Discuss the foods you eat and the vitamins you take with your health care  professional. Some people who take this medicine have severe bone, joint, and/or muscle pain. This medicine may also increase your risk for jaw problems or a broken thigh bone. Tell your doctor right away if you have severe pain in your jaw, bones, joints, or muscles. Tell your doctor if you have any pain that does not go away or that gets worse. Tell your dentist and dental surgeon that you are taking this medicine. You should not have major dental surgery while on this medicine. See your dentist to have a dental exam and fix any dental problems before starting this medicine. Take good care of your teeth while on this medicine. Make sure you see your dentist for regular follow-up appointments. What side effects may I notice from receiving this medicine? Side effects that you should report to your doctor or health care professional as soon as possible: -allergic reactions like skin rash, itching or hives, swelling of the face, lips, or tongue -anxiety, confusion, or depression -breathing problems -changes in vision -eye pain -feeling faint or lightheaded, falls -jaw pain, especially after dental work -mouth sores -muscle cramps, stiffness, or weakness -redness, blistering, peeling or loosening of the skin, including inside the mouth -trouble passing urine or change in the amount of urine Side effects that usually do not require medical attention (report to your doctor or health care professional if they continue or are bothersome): -bone, joint, or muscle pain -constipation -diarrhea -fever -hair loss -irritation at site where injected -loss of appetite -nausea, vomiting -stomach upset -trouble sleeping -trouble swallowing -weak or tired This list may not describe all possible side effects. Call your doctor for medical advice about side effects. You may report side effects to FDA at 1-800-FDA-1088. Where should I keep my medicine? This drug is given in a hospital or clinic and will not  be stored at home. NOTE: This sheet is a summary. It may not cover all possible information. If you have questions about this medicine, talk to your doctor, pharmacist, or health care provider.    2016, Elsevier/Gold Standard. (2013-09-12 14:19:39)  

## 2016-03-08 ENCOUNTER — Ambulatory Visit (HOSPITAL_BASED_OUTPATIENT_CLINIC_OR_DEPARTMENT_OTHER): Payer: Medicare Other

## 2016-03-08 ENCOUNTER — Other Ambulatory Visit (HOSPITAL_BASED_OUTPATIENT_CLINIC_OR_DEPARTMENT_OTHER): Payer: Medicare Other

## 2016-03-08 VITALS — BP 164/75 | HR 56 | Temp 97.6°F | Resp 18

## 2016-03-08 DIAGNOSIS — C50812 Malignant neoplasm of overlapping sites of left female breast: Secondary | ICD-10-CM

## 2016-03-08 DIAGNOSIS — Z5112 Encounter for antineoplastic immunotherapy: Secondary | ICD-10-CM

## 2016-03-08 DIAGNOSIS — C773 Secondary and unspecified malignant neoplasm of axilla and upper limb lymph nodes: Secondary | ICD-10-CM

## 2016-03-08 LAB — COMPREHENSIVE METABOLIC PANEL
ALT: 13 U/L (ref 0–55)
ANION GAP: 10 meq/L (ref 3–11)
AST: 26 U/L (ref 5–34)
Albumin: 4 g/dL (ref 3.5–5.0)
Alkaline Phosphatase: 67 U/L (ref 40–150)
BILIRUBIN TOTAL: 0.57 mg/dL (ref 0.20–1.20)
BUN: 13.5 mg/dL (ref 7.0–26.0)
CO2: 23 meq/L (ref 22–29)
Calcium: 10.1 mg/dL (ref 8.4–10.4)
Chloride: 110 mEq/L — ABNORMAL HIGH (ref 98–109)
Creatinine: 0.8 mg/dL (ref 0.6–1.1)
EGFR: 61 mL/min/{1.73_m2} — AB (ref >90)
GLUCOSE: 88 mg/dL (ref 70–140)
POTASSIUM: 4 meq/L (ref 3.5–5.1)
SODIUM: 144 meq/L (ref 136–145)
TOTAL PROTEIN: 7.2 g/dL (ref 6.4–8.3)

## 2016-03-08 LAB — CBC WITH DIFFERENTIAL/PLATELET
BASO%: 0.5 % (ref 0.0–2.0)
Basophils Absolute: 0 10*3/uL (ref 0.0–0.1)
EOS%: 3.3 % (ref 0.0–7.0)
Eosinophils Absolute: 0.1 10*3/uL (ref 0.0–0.5)
HCT: 43.5 % (ref 34.8–46.6)
HGB: 14.1 g/dL (ref 11.6–15.9)
LYMPH#: 0.8 10*3/uL — AB (ref 0.9–3.3)
LYMPH%: 18 % (ref 14.0–49.7)
MCH: 29.7 pg (ref 25.1–34.0)
MCHC: 32.4 g/dL (ref 31.5–36.0)
MCV: 91.6 fL (ref 79.5–101.0)
MONO#: 0.4 10*3/uL (ref 0.1–0.9)
MONO%: 10.3 % (ref 0.0–14.0)
NEUT#: 2.9 10*3/uL (ref 1.5–6.5)
NEUT%: 67.9 % (ref 38.4–76.8)
PLATELETS: 128 10*3/uL — AB (ref 145–400)
RBC: 4.75 10*6/uL (ref 3.70–5.45)
RDW: 14.4 % (ref 11.2–14.5)
WBC: 4.3 10*3/uL (ref 3.9–10.3)

## 2016-03-08 MED ORDER — TRASTUZUMAB CHEMO 150 MG IV SOLR
6.0000 mg/kg | Freq: Once | INTRAVENOUS | Status: AC
  Administered 2016-03-08: 294 mg via INTRAVENOUS
  Filled 2016-03-08: qty 14

## 2016-03-08 MED ORDER — ACETAMINOPHEN 325 MG PO TABS
ORAL_TABLET | ORAL | Status: AC
  Filled 2016-03-08: qty 2

## 2016-03-08 MED ORDER — ACETAMINOPHEN 325 MG PO TABS
650.0000 mg | ORAL_TABLET | Freq: Once | ORAL | Status: AC
Start: 2016-03-08 — End: 2016-03-08
  Administered 2016-03-08: 650 mg via ORAL

## 2016-03-08 MED ORDER — SODIUM CHLORIDE 0.9 % IV SOLN
Freq: Once | INTRAVENOUS | Status: AC
  Administered 2016-03-08: 12:00:00 via INTRAVENOUS

## 2016-03-08 NOTE — Progress Notes (Signed)
Ok to treat with Echo from 09/22/2015 Per Val RN

## 2016-03-08 NOTE — Patient Instructions (Addendum)
Trastuzumab injection for infusion What is this medicine? TRASTUZUMAB (tras TOO zoo mab) is a monoclonal antibody. It is used to treat breast cancer and stomach cancer. This medicine may be used for other purposes; ask your health care provider or pharmacist if you have questions. What should I tell my health care provider before I take this medicine? They need to know if you have any of these conditions: -heart disease -heart failure -infection (especially a virus infection such as chickenpox, cold sores, or herpes) -lung or breathing disease, like asthma -recent or ongoing radiation therapy -an unusual or allergic reaction to trastuzumab, benzyl alcohol, or other medications, foods, dyes, or preservatives -pregnant or trying to get pregnant -breast-feeding How should I use this medicine? This drug is given as an infusion into a vein. It is administered in a hospital or clinic by a specially trained health care professional. Talk to your pediatrician regarding the use of this medicine in children. This medicine is not approved for use in children. Overdosage: If you think you have taken too much of this medicine contact a poison control center or emergency room at once. NOTE: This medicine is only for you. Do not share this medicine with others. What if I miss a dose? It is important not to miss a dose. Call your doctor or health care professional if you are unable to keep an appointment. What may interact with this medicine? -doxorubicin -warfarin This list may not describe all possible interactions. Give your health care provider a list of all the medicines, herbs, non-prescription drugs, or dietary supplements you use. Also tell them if you smoke, drink alcohol, or use illegal drugs. Some items may interact with your medicine. What should I watch for while using this medicine? Visit your doctor for checks on your progress. Report any side effects. Continue your course of treatment even  though you feel ill unless your doctor tells you to stop. Call your doctor or health care professional for advice if you get a fever, chills or sore throat, or other symptoms of a cold or flu. Do not treat yourself. Try to avoid being around people who are sick. You may experience fever, chills and shaking during your first infusion. These effects are usually mild and can be treated with other medicines. Report any side effects during the infusion to your health care professional. Fever and chills usually do not happen with later infusions. Do not become pregnant while taking this medicine or for 7 months after stopping it. Women should inform their doctor if they wish to become pregnant or think they might be pregnant. Women of child-bearing potential will need to have a negative pregnancy test before starting this medicine. There is a potential for serious side effects to an unborn child. Talk to your health care professional or pharmacist for more information. Do not breast-feed an infant while taking this medicine or for 7 months after stopping it. Women must use effective birth control with this medicine. What side effects may I notice from receiving this medicine? Side effects that you should report to your doctor or other health care professional as soon as possible: -breathing difficulties -chest pain or palpitations -cough -dizziness or fainting -fever or chills, sore throat -skin rash, itching or hives -swelling of the legs or ankles -unusually weak or tired Side effects that usually do not require medical attention (report to your doctor or other health care professional if they continue or are bothersome): -loss of appetite -headache -muscle aches -nausea This   list may not describe all possible side effects. Call your doctor for medical advice about side effects. You may report side effects to FDA at 1-800-FDA-1088. Where should I keep my medicine? This drug is given in a hospital  or clinic and will not be stored at home. NOTE: This sheet is a summary. It may not cover all possible information. If you have questions about this medicine, talk to your doctor, pharmacist, or health care provider.    2016, Elsevier/Gold Standard. (2014-07-23 11:49:32)   

## 2016-03-12 ENCOUNTER — Other Ambulatory Visit (HOSPITAL_COMMUNITY): Payer: Medicare Other

## 2016-03-27 ENCOUNTER — Other Ambulatory Visit: Payer: Self-pay

## 2016-03-27 ENCOUNTER — Ambulatory Visit (HOSPITAL_COMMUNITY): Payer: Medicare Other | Attending: Oncology

## 2016-03-27 DIAGNOSIS — I071 Rheumatic tricuspid insufficiency: Secondary | ICD-10-CM | POA: Insufficient documentation

## 2016-03-27 DIAGNOSIS — I2510 Atherosclerotic heart disease of native coronary artery without angina pectoris: Secondary | ICD-10-CM | POA: Diagnosis not present

## 2016-03-27 DIAGNOSIS — Z9010 Acquired absence of unspecified breast and nipple: Secondary | ICD-10-CM | POA: Diagnosis not present

## 2016-03-27 DIAGNOSIS — C50812 Malignant neoplasm of overlapping sites of left female breast: Secondary | ICD-10-CM

## 2016-03-27 DIAGNOSIS — Z923 Personal history of irradiation: Secondary | ICD-10-CM | POA: Diagnosis not present

## 2016-03-27 DIAGNOSIS — I351 Nonrheumatic aortic (valve) insufficiency: Secondary | ICD-10-CM | POA: Insufficient documentation

## 2016-03-27 DIAGNOSIS — I340 Nonrheumatic mitral (valve) insufficiency: Secondary | ICD-10-CM | POA: Diagnosis not present

## 2016-04-05 ENCOUNTER — Ambulatory Visit (HOSPITAL_BASED_OUTPATIENT_CLINIC_OR_DEPARTMENT_OTHER): Payer: Medicare Other

## 2016-04-05 ENCOUNTER — Other Ambulatory Visit (HOSPITAL_BASED_OUTPATIENT_CLINIC_OR_DEPARTMENT_OTHER): Payer: Medicare Other

## 2016-04-05 ENCOUNTER — Ambulatory Visit (HOSPITAL_BASED_OUTPATIENT_CLINIC_OR_DEPARTMENT_OTHER): Payer: Medicare Other | Admitting: Oncology

## 2016-04-05 DIAGNOSIS — G4762 Sleep related leg cramps: Secondary | ICD-10-CM

## 2016-04-05 DIAGNOSIS — C773 Secondary and unspecified malignant neoplasm of axilla and upper limb lymph nodes: Secondary | ICD-10-CM

## 2016-04-05 DIAGNOSIS — Z171 Estrogen receptor negative status [ER-]: Secondary | ICD-10-CM | POA: Diagnosis not present

## 2016-04-05 DIAGNOSIS — Z5112 Encounter for antineoplastic immunotherapy: Secondary | ICD-10-CM

## 2016-04-05 DIAGNOSIS — C50812 Malignant neoplasm of overlapping sites of left female breast: Secondary | ICD-10-CM | POA: Diagnosis present

## 2016-04-05 LAB — CBC WITH DIFFERENTIAL/PLATELET
BASO%: 0.5 % (ref 0.0–2.0)
Basophils Absolute: 0 10*3/uL (ref 0.0–0.1)
EOS ABS: 0.2 10*3/uL (ref 0.0–0.5)
EOS%: 3.7 % (ref 0.0–7.0)
HEMATOCRIT: 42.9 % (ref 34.8–46.6)
HEMOGLOBIN: 13.9 g/dL (ref 11.6–15.9)
LYMPH#: 0.7 10*3/uL — AB (ref 0.9–3.3)
LYMPH%: 16.5 % (ref 14.0–49.7)
MCH: 29.6 pg (ref 25.1–34.0)
MCHC: 32.4 g/dL (ref 31.5–36.0)
MCV: 91.3 fL (ref 79.5–101.0)
MONO#: 0.5 10*3/uL (ref 0.1–0.9)
MONO%: 11.9 % (ref 0.0–14.0)
NEUT%: 67.4 % (ref 38.4–76.8)
NEUTROS ABS: 2.7 10*3/uL (ref 1.5–6.5)
PLATELETS: 146 10*3/uL (ref 145–400)
RBC: 4.7 10*6/uL (ref 3.70–5.45)
RDW: 14.4 % (ref 11.2–14.5)
WBC: 4.1 10*3/uL (ref 3.9–10.3)

## 2016-04-05 LAB — COMPREHENSIVE METABOLIC PANEL
ALBUMIN: 3.8 g/dL (ref 3.5–5.0)
ALK PHOS: 61 U/L (ref 40–150)
ALT: 13 U/L (ref 0–55)
AST: 28 U/L (ref 5–34)
Anion Gap: 8 mEq/L (ref 3–11)
BILIRUBIN TOTAL: 0.65 mg/dL (ref 0.20–1.20)
BUN: 20.4 mg/dL (ref 7.0–26.0)
CALCIUM: 10.1 mg/dL (ref 8.4–10.4)
CO2: 27 mEq/L (ref 22–29)
Chloride: 108 mEq/L (ref 98–109)
Creatinine: 0.9 mg/dL (ref 0.6–1.1)
EGFR: 53 mL/min/{1.73_m2} — AB (ref >90)
GLUCOSE: 97 mg/dL (ref 70–140)
Potassium: 4.7 mEq/L (ref 3.5–5.1)
SODIUM: 142 meq/L (ref 136–145)
TOTAL PROTEIN: 7 g/dL (ref 6.4–8.3)

## 2016-04-05 MED ORDER — SODIUM CHLORIDE 0.9 % IV SOLN
Freq: Once | INTRAVENOUS | Status: AC
  Administered 2016-04-05: 11:00:00 via INTRAVENOUS

## 2016-04-05 MED ORDER — ACETAMINOPHEN 325 MG PO TABS
ORAL_TABLET | ORAL | Status: AC
  Filled 2016-04-05: qty 2

## 2016-04-05 MED ORDER — TRASTUZUMAB CHEMO 150 MG IV SOLR
6.0000 mg/kg | Freq: Once | INTRAVENOUS | Status: AC
  Administered 2016-04-05: 294 mg via INTRAVENOUS
  Filled 2016-04-05: qty 14

## 2016-04-05 MED ORDER — SODIUM CHLORIDE 0.9 % IJ SOLN
10.0000 mL | INTRAMUSCULAR | Status: DC | PRN
  Filled 2016-04-05: qty 10

## 2016-04-05 MED ORDER — ACETAMINOPHEN 325 MG PO TABS
650.0000 mg | ORAL_TABLET | Freq: Once | ORAL | Status: AC
  Administered 2016-04-05: 650 mg via ORAL

## 2016-04-05 MED ORDER — HEPARIN SOD (PORK) LOCK FLUSH 100 UNIT/ML IV SOLN
500.0000 [IU] | Freq: Once | INTRAVENOUS | Status: DC | PRN
  Filled 2016-04-05: qty 5

## 2016-04-05 NOTE — Progress Notes (Signed)
[name]  [phone] [phone]  OFFICE PROGRESS NOTE   ID: [name]   DOB: 1914/04/05  MR#: [id number]  EHM#:[id number]  Cc:  Azalee Course, Glori Bickers  CHIEF COMPLAINT: metastatic breast cancer  CURRENT TREATMENT: trastuzumab every 4 weeks; zolendronate every 12 weeks  BREAST CANCER HISTORY: From the original intake note:  Mrs. Hannay's gynecologist, Dr. Josefa Half, palpated a mass in the patient's left breast in 2004. The patient has a daughter, who is a Marine scientist, formerly I believe an oncology nurse, currently a pulmonary nurse in [place], so the patient went there for her care, and while I do not have all the workup, I do have the pathology report from October 01, 2002, which was her left lumpectomy and sentinel lymph node sampling under Dr. Kemper Durie. This showed [phone]) a 2.5 cm infiltrating ductal carcinoma with lobular features (the cells were E-cadherin positive), involving one of three sentinel lymph nodes. The anterior margin was close at less than 1 mm. The patient saw Dr. Arloa Koh here, and the option of completion mastectomy versus radiotherapy alone was discussed. He felt, and I would probably have agreed, that despite the very close margin, which was nevertheless technically negative, proceeding to radiation without "clearing the margin" further would be adequate. Accordingly, the patient did receive a total of 5040 cGy with an additional 1000 cGy boost to the left breast between September 11 and February 24, 2003.   The patient then did well, but in 2007 developed some redness and bumpiness around the left areola. She brought this to Dr. Ermelinda Das attention, and apparently an initial biopsy was negative, as was a mammogram, however, with further development of the change, the patient had a biopsy under anesthesia, and this apparently was positive (I do not have those records). With this information, she proceeded to  left mastectomy on February 26, 2006. The pathology there ([id number]) showed dermal involvement by adenocarcinoma, with pagetoid spread and lymphatic space invasion. The closest margin being 1.2 cm from the inferior margin. Everything else being amply negative. There were no discrete masses or lesions identified in the left breast. The patient subsequently underwent left mastectomy.   There were a series of further skin recurrences, first in November of 2008. Initially, these were resected, but later there were too many lesions to resect, in addition to a large left axillary lymph node. This was biopsied in January 2011, showing the tumor now to be HER-2/neu positive. Patient has been on anti-HER-2/neu treatments since January 2011, initially with trastuzumab plus lapatinib. Lapatinib was discontinued in April 2011 secondary to side effects. Continues now to receive trastuzumab on a q. 3 week basis. Also receives a zoledronic acid every 3 months.    INTERVAL HISTORY: [name] returns today for follow up of her r HER-2 positive breast cancer, accompanied by her daughter-in-law. [name] continues on trastuzumab and zolendronate, with no side effects that she is aware of. She is due for the trastuzumab today.  She had her most recent echo 03/27/2016 and this showed an ejection fraction in the 60-65% range.  Donia had her 89th birthday last month. This was a very nice location and she has settled in well in [place]. She is currently walking she says about 2 miles daily there. She likes the food. She is developing some companionship among other residents. She finds the staff very friendly  REVIEW OF SYSTEMS: The daughter-in-law tells me [name]'s mentation is becoming more of a problem. Aside from that a detailed  review of systems today was stable  PAST MEDICAL HISTORY: Past Medical History:  Diagnosis Date  . Breast cancer ([place])   . CAD (coronary artery disease)    cath w/ trivial disease   . Cancer ([place])    breast  . Cataract   . Diverticulosis of colon   . Macular degeneration   . Osteoporosis    s/p compression fx T12    PAST SURGICAL HISTORY: Past Surgical History:  Procedure Laterality Date  . ABDOMINAL HYSTERECTOMY    . BREAST LUMPECTOMY    . excision for Paget's vulvar    . FLEXIBLE SIGMOIDOSCOPY N/A 06/27/2012   Procedure: FLEXIBLE SIGMOIDOSCOPY;  Surgeon: Lafayette Dragon, MD;  Location: [facility];  Service: Endoscopy;  Laterality: N/A;  . MASTECTOMY     left  . vertbroplasty     T12    FAMILY HISTORY The patient's father died in an automobile accident at 63, the patient's mother from a stroke at 16. The patient had one brother who died from causes unknown to her at the age of 71, and a sister who had "lots of problems" and died at the age of 74, but there is no history of breast or ovarian cancer in the family.   GYNECOLOGIC HISTORY: She is GX P3. She took hormone replacement therapy briefly after menopause.   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 She has been a widow since 1997; lives independently, drives, and does all of her activities of daily living. Her daughter, [name], is a Marine scientist in [place]. Her daughter, [name], lives in [place].[name], who frequently comes with the patient to medical visits, is married to the patient's son, who is also her power of attorney. He works for the [employer]. The patient has six grandchildren and three great-grandchildren. She attends [employer].    ADVANCED DIRECTIVES: In place  HEALTH MAINTENANCE: Social History  Substance Use Topics  . Smoking status: Former Smoker    Quit date: 05/01/1990  . Smokeless tobacco: Never Used  . Alcohol use No     Colonoscopy: 02/06/2002  PAP: Not on file  Bone density: Not on file  Lipid panel: Not on file  Allergies  Allergen Reactions  . Solifenacin Succinate      REACTION: "sick" nausea    Current Outpatient Prescriptions  Medication Sig Dispense Refill  . acetaminophen (TYLENOL) 500 MG tablet Take 1,000 mg by mouth daily.     Marland Kitchen losartan (COZAAR) 25 MG tablet TAKE ONE TABLET BY MOUTH ONCE DAILY. 90 tablet 6  . mirabegron ER (MYRBETRIQ) 50 MG TB24 tablet Take 1 tablet (50 mg total) by mouth daily. (Patient not taking: Reported on 10/04/2015) 30 tablet 6  . Multiple Vitamins-Minerals (PRESERVISION AREDS 2 PO) Take 1 tablet by mouth 2 (two) times daily.    Marland Kitchen triamcinolone cream (KENALOG) 0.1 % Apply 1 application topically 2 (two) times daily as needed. 80 g 1   No current facility-administered medications for this visit.     OBJECTIVE: Elderly white woman in no acute distress There were no vitals filed for this visit.   There is no height or weight on file to calculate BMI.    ECOG FS: 1  Sclerae unicteric, EOMs intact Oropharynx clear and moist No cervical or supraclavicular adenopathy Lungs no rales or rhonchi Heart regular rate and rhythm Abd soft, nontender, positive bowel sounds MSK no focal spinal tenderness,  no upper extremity lymphedema Neuro: nonfocal, appropriate affect Breasts: The right breast is benign. The left breast is status post mastectomy. There is no evidence of her chest wall recurrence. The left axilla is benign.    LAB RESULTS: Lab Results  Component Value Date   WBC 4.1 04/05/2016   NEUTROABS 2.7 04/05/2016   HGB 13.9 04/05/2016   HCT 42.9 04/05/2016   MCV 91.3 04/05/2016   PLT 146 04/05/2016      Chemistry      Component Value Date/Time   NA 142 04/05/2016 0941   K 4.7 04/05/2016 0941   CL 104 12/09/2013 1245   CL 106 10/15/2012 1136   CO2 27 04/05/2016 0941   BUN 20.4 04/05/2016 0941   CREATININE 0.9 04/05/2016 0941      Component Value Date/Time   CALCIUM 10.1 04/05/2016 0941   ALKPHOS 61 04/05/2016 0941   AST 28 04/05/2016 0941   ALT 13 04/05/2016 0941   BILITOT 0.65 04/05/2016 0941        Lab Results  Component Value Date   LABCA2 17 09/26/2011     STUDIES: Patient:    [name], [name] MR #:       [id number] Study Date: 03/27/2016 Gender:     F Age:        82 Height:     152.4 cm Weight:     45 kg BSA:        1.38 m^2 Pt. Status: Room:   ATTENDING    [name], Valli Glance     Noemy Hallmon, Virgie Dad  REFERRING    Chena Chohan, Virgie Dad  PERFORMING   [facility], Outpatient  SONOGRAPHER  [facility], RDCS  cc:  ------------------------------------------------------------------- LV EF: 60% -   65%  ------------------------------------------------------------------- Indications:      Chemotherapy (Z09).  ASSESSMENT: [name] is a 82 y.o.  [place], [place] woman:  (1) Status post leftBreast overlapping sites lumpectomy and sentinel lymph node dissection in 09/2002 for a T2 N1 (Stage II) invasive ductal carcinoma, triple negative, status post radiation therapy completed in 01/2003.  (2) Local recurrence around the areola 01/2006, leading to mastectomy.  (3) A series of further skin recurrences first noted in 03/2007. Initially these were resected, later with too many lesions to resect; there was also a large left axillary lymph node which was biopsied in 04/2009 showing the tumor now to be HER-2 positive.   (4) Since 04/2009 she has been on anti-HER-2 treatment, initially with trastuzumab plus lapatinib, with a lapatinib discontinued 07/2009 because of side effects.   (5) Recurrence of vulvar Paget's disease, originally diagnosed and resected in 2004 and recurred in 02/2012.  Followed by Dr. Paula Compton and Dr. Marti Sleigh.  (6) Continues to receive trastuzumab every 4 weeks and zoledronic acid every 3 months.  (a)  Echocardiogram Repeated 09/22/2015, shows an ejection fraction of 55-60%  (7) Nocturnal leg cramping  PLAN: Ethell is now 10 years out from documentation of her local breast cancer recurrence, which  proved to be HER-2 positive. She has been on trastuzumab for the last 7 years. She is tolerating trastuzumab well. We are doing it once a month and we are checking echocardiograms every 6 months.  She seems to be settling into [place] well. The family has noted some cognitive loss. Possibly this is due to the new surroundings. Hopefully this will improve, if that is the case, over the next several months.  She is going to see me again in  March. If the family feels there continues to be cognitive dysfunction at that point, I will offer them the option of observation as far as her breast cancer is concerned.  They know to call for any problems that may develop before her next visit here.   Chauncey Cruel, MD  04/07/2016 9:27 AM

## 2016-04-05 NOTE — Patient Instructions (Signed)
[facility] Discharge Instructions for Patients Receiving Chemotherapy  Today you received the following chemotherapy agents:  Herceptin  To help prevent nausea and vomiting after your treatment, we encourage you to take your nausea medication as prescribed.   If you develop nausea and vomiting that is not controlled by your nausea medication, call the clinic.   BELOW ARE SYMPTOMS THAT SHOULD BE REPORTED IMMEDIATELY:  *FEVER GREATER THAN 100.5 F  *CHILLS WITH OR WITHOUT FEVER  NAUSEA AND VOMITING THAT IS NOT CONTROLLED WITH YOUR NAUSEA MEDICATION  *UNUSUAL SHORTNESS OF BREATH  *UNUSUAL BRUISING OR BLEEDING  TENDERNESS IN MOUTH AND THROAT WITH OR WITHOUT PRESENCE OF ULCERS  *URINARY PROBLEMS  *BOWEL PROBLEMS  UNUSUAL RASH Items with * indicate a potential emergency and should be followed up as soon as possible.  Feel free to call the clinic you have any questions or concerns. The clinic phone number is [phone].  Please show the CHEMO ALERT CARD at check-in to the Emergency Department and triage nurse.   

## 2016-05-02 ENCOUNTER — Ambulatory Visit (HOSPITAL_BASED_OUTPATIENT_CLINIC_OR_DEPARTMENT_OTHER): Payer: Medicare Other

## 2016-05-02 ENCOUNTER — Other Ambulatory Visit (HOSPITAL_BASED_OUTPATIENT_CLINIC_OR_DEPARTMENT_OTHER): Payer: Medicare Other

## 2016-05-02 VITALS — BP 171/76 | HR 59 | Temp 98.0°F | Resp 20

## 2016-05-02 DIAGNOSIS — C773 Secondary and unspecified malignant neoplasm of axilla and upper limb lymph nodes: Secondary | ICD-10-CM | POA: Diagnosis not present

## 2016-05-02 DIAGNOSIS — C50919 Malignant neoplasm of unspecified site of unspecified female breast: Secondary | ICD-10-CM

## 2016-05-02 DIAGNOSIS — M810 Age-related osteoporosis without current pathological fracture: Secondary | ICD-10-CM

## 2016-05-02 DIAGNOSIS — C50812 Malignant neoplasm of overlapping sites of left female breast: Secondary | ICD-10-CM

## 2016-05-02 DIAGNOSIS — Z5112 Encounter for antineoplastic immunotherapy: Secondary | ICD-10-CM

## 2016-05-02 LAB — CBC WITH DIFFERENTIAL/PLATELET
BASO%: 0.4 % (ref 0.0–2.0)
BASOS ABS: 0 10*3/uL (ref 0.0–0.1)
EOS ABS: 0.2 10*3/uL (ref 0.0–0.5)
EOS%: 3.3 % (ref 0.0–7.0)
HEMATOCRIT: 43.2 % (ref 34.8–46.6)
HEMOGLOBIN: 13.9 g/dL (ref 11.6–15.9)
LYMPH#: 0.8 10*3/uL — AB (ref 0.9–3.3)
LYMPH%: 17.5 % (ref 14.0–49.7)
MCH: 29 pg (ref 25.1–34.0)
MCHC: 32.2 g/dL (ref 31.5–36.0)
MCV: 90.2 fL (ref 79.5–101.0)
MONO#: 0.6 10*3/uL (ref 0.1–0.9)
MONO%: 12.9 % (ref 0.0–14.0)
NEUT#: 3 10*3/uL (ref 1.5–6.5)
NEUT%: 65.9 % (ref 38.4–76.8)
PLATELETS: 147 10*3/uL (ref 145–400)
RBC: 4.79 10*6/uL (ref 3.70–5.45)
RDW: 14.7 % — AB (ref 11.2–14.5)
WBC: 4.6 10*3/uL (ref 3.9–10.3)

## 2016-05-02 LAB — COMPREHENSIVE METABOLIC PANEL
ALBUMIN: 4 g/dL (ref 3.5–5.0)
ALK PHOS: 64 U/L (ref 40–150)
ALT: 13 U/L (ref 0–55)
ANION GAP: 9 meq/L (ref 3–11)
AST: 27 U/L (ref 5–34)
BILIRUBIN TOTAL: 0.69 mg/dL (ref 0.20–1.20)
BUN: 19.7 mg/dL (ref 7.0–26.0)
CALCIUM: 10.1 mg/dL (ref 8.4–10.4)
CO2: 22 mEq/L (ref 22–29)
Chloride: 110 mEq/L — ABNORMAL HIGH (ref 98–109)
Creatinine: 0.9 mg/dL (ref 0.6–1.1)
EGFR: 52 mL/min/{1.73_m2} — AB (ref >90)
Glucose: 96 mg/dl (ref 70–140)
POTASSIUM: 4.5 meq/L (ref 3.5–5.1)
Sodium: 141 mEq/L (ref 136–145)
Total Protein: 6.9 g/dL (ref 6.4–8.3)

## 2016-05-02 MED ORDER — ACETAMINOPHEN 325 MG PO TABS
650.0000 mg | ORAL_TABLET | Freq: Once | ORAL | Status: AC
  Administered 2016-05-02: 650 mg via ORAL

## 2016-05-02 MED ORDER — TRASTUZUMAB CHEMO 150 MG IV SOLR
6.0000 mg/kg | Freq: Once | INTRAVENOUS | Status: AC
  Administered 2016-05-02: 294 mg via INTRAVENOUS
  Filled 2016-05-02: qty 14

## 2016-05-02 MED ORDER — ACETAMINOPHEN 325 MG PO TABS
ORAL_TABLET | ORAL | Status: AC
  Filled 2016-05-02: qty 2

## 2016-05-02 MED ORDER — SODIUM CHLORIDE 0.9 % IV SOLN
Freq: Once | INTRAVENOUS | Status: AC
  Administered 2016-05-02: 12:00:00 via INTRAVENOUS

## 2016-05-02 MED ORDER — SODIUM CHLORIDE 0.9 % IV SOLN
3.0000 mg | Freq: Once | INTRAVENOUS | Status: AC
  Administered 2016-05-02: 3 mg via INTRAVENOUS
  Filled 2016-05-02: qty 3.75

## 2016-05-02 NOTE — Patient Instructions (Signed)
[facility] Discharge Instructions for Patients Receiving Chemotherapy  Today you received the following chemotherapy agents: Herceptin   To help prevent nausea and vomiting after your treatment, we encourage you to take your nausea medication as directed.    If you develop nausea and vomiting that is not controlled by your nausea medication, call the clinic.   BELOW ARE SYMPTOMS THAT SHOULD BE REPORTED IMMEDIATELY:  *FEVER GREATER THAN 100.5 F  *CHILLS WITH OR WITHOUT FEVER  NAUSEA AND VOMITING THAT IS NOT CONTROLLED WITH YOUR NAUSEA MEDICATION  *UNUSUAL SHORTNESS OF BREATH  *UNUSUAL BRUISING OR BLEEDING  TENDERNESS IN MOUTH AND THROAT WITH OR WITHOUT PRESENCE OF ULCERS  *URINARY PROBLEMS  *BOWEL PROBLEMS  UNUSUAL RASH Items with * indicate a potential emergency and should be followed up as soon as possible.  Feel free to call the clinic you have any questions or concerns. The clinic phone number is [phone].  Please show the CHEMO ALERT CARD at check-in to the Emergency Department and triage nurse.   

## 2016-05-03 ENCOUNTER — Ambulatory Visit: Payer: Medicare Other

## 2016-05-30 ENCOUNTER — Ambulatory Visit (HOSPITAL_BASED_OUTPATIENT_CLINIC_OR_DEPARTMENT_OTHER): Payer: Medicare Other

## 2016-05-30 ENCOUNTER — Other Ambulatory Visit (HOSPITAL_BASED_OUTPATIENT_CLINIC_OR_DEPARTMENT_OTHER): Payer: Medicare Other

## 2016-05-30 VITALS — BP 173/79 | HR 62 | Temp 97.8°F | Resp 18

## 2016-05-30 DIAGNOSIS — C50812 Malignant neoplasm of overlapping sites of left female breast: Secondary | ICD-10-CM | POA: Diagnosis present

## 2016-05-30 DIAGNOSIS — Z5112 Encounter for antineoplastic immunotherapy: Secondary | ICD-10-CM | POA: Diagnosis present

## 2016-05-30 DIAGNOSIS — C773 Secondary and unspecified malignant neoplasm of axilla and upper limb lymph nodes: Secondary | ICD-10-CM | POA: Diagnosis not present

## 2016-05-30 LAB — COMPREHENSIVE METABOLIC PANEL
ALBUMIN: 4 g/dL (ref 3.5–5.0)
ALK PHOS: 59 U/L (ref 40–150)
ALT: 13 U/L (ref 0–55)
ANION GAP: 9 meq/L (ref 3–11)
AST: 27 U/L (ref 5–34)
BILIRUBIN TOTAL: 0.57 mg/dL (ref 0.20–1.20)
BUN: 17.1 mg/dL (ref 7.0–26.0)
CALCIUM: 10 mg/dL (ref 8.4–10.4)
CO2: 22 mEq/L (ref 22–29)
Chloride: 111 mEq/L — ABNORMAL HIGH (ref 98–109)
Creatinine: 0.8 mg/dL (ref 0.6–1.1)
EGFR: 57 mL/min/{1.73_m2} — AB (ref >90)
GLUCOSE: 106 mg/dL (ref 70–140)
POTASSIUM: 4.7 meq/L (ref 3.5–5.1)
Sodium: 141 mEq/L (ref 136–145)
TOTAL PROTEIN: 7.1 g/dL (ref 6.4–8.3)

## 2016-05-30 LAB — CBC WITH DIFFERENTIAL/PLATELET
BASO%: 0.8 % (ref 0.0–2.0)
BASOS ABS: 0 10*3/uL (ref 0.0–0.1)
EOS ABS: 0.1 10*3/uL (ref 0.0–0.5)
EOS%: 2.6 % (ref 0.0–7.0)
HEMATOCRIT: 44.2 % (ref 34.8–46.6)
HEMOGLOBIN: 14.4 g/dL (ref 11.6–15.9)
LYMPH#: 0.7 10*3/uL — AB (ref 0.9–3.3)
LYMPH%: 14.4 % (ref 14.0–49.7)
MCH: 29.4 pg (ref 25.1–34.0)
MCHC: 32.5 g/dL (ref 31.5–36.0)
MCV: 90.5 fL (ref 79.5–101.0)
MONO#: 0.4 10*3/uL (ref 0.1–0.9)
MONO%: 8.4 % (ref 0.0–14.0)
NEUT%: 73.8 % (ref 38.4–76.8)
NEUTROS ABS: 3.7 10*3/uL (ref 1.5–6.5)
PLATELETS: 139 10*3/uL — AB (ref 145–400)
RBC: 4.88 10*6/uL (ref 3.70–5.45)
RDW: 15 % — AB (ref 11.2–14.5)
WBC: 5 10*3/uL (ref 3.9–10.3)

## 2016-05-30 MED ORDER — ACETAMINOPHEN 325 MG PO TABS
ORAL_TABLET | ORAL | Status: AC
  Filled 2016-05-30: qty 2

## 2016-05-30 MED ORDER — TRASTUZUMAB CHEMO 150 MG IV SOLR
6.0000 mg/kg | Freq: Once | INTRAVENOUS | Status: AC
  Administered 2016-05-30: 294 mg via INTRAVENOUS
  Filled 2016-05-30: qty 14

## 2016-05-30 MED ORDER — ACETAMINOPHEN 325 MG PO TABS
650.0000 mg | ORAL_TABLET | Freq: Once | ORAL | Status: AC
  Administered 2016-05-30: 650 mg via ORAL

## 2016-05-30 MED ORDER — SODIUM CHLORIDE 0.9 % IV SOLN
Freq: Once | INTRAVENOUS | Status: AC
  Administered 2016-05-30: 12:00:00 via INTRAVENOUS

## 2016-05-30 NOTE — Patient Instructions (Signed)
[facility] Discharge Instructions for Patients Receiving Chemotherapy  Today you received the following chemotherapy agents: Herceptin   To help prevent nausea and vomiting after your treatment, we encourage you to take your nausea medication as directed.    If you develop nausea and vomiting that is not controlled by your nausea medication, call the clinic.   BELOW ARE SYMPTOMS THAT SHOULD BE REPORTED IMMEDIATELY:  *FEVER GREATER THAN 100.5 F  *CHILLS WITH OR WITHOUT FEVER  NAUSEA AND VOMITING THAT IS NOT CONTROLLED WITH YOUR NAUSEA MEDICATION  *UNUSUAL SHORTNESS OF BREATH  *UNUSUAL BRUISING OR BLEEDING  TENDERNESS IN MOUTH AND THROAT WITH OR WITHOUT PRESENCE OF ULCERS  *URINARY PROBLEMS  *BOWEL PROBLEMS  UNUSUAL RASH Items with * indicate a potential emergency and should be followed up as soon as possible.  Feel free to call the clinic you have any questions or concerns. The clinic phone number is [phone].  Please show the CHEMO ALERT CARD at check-in to the Emergency Department and triage nurse.   

## 2016-05-31 ENCOUNTER — Ambulatory Visit: Payer: Medicare Other

## 2016-06-12 ENCOUNTER — Telehealth: Payer: Self-pay | Admitting: Internal Medicine

## 2016-06-12 NOTE — Telephone Encounter (Signed)
Patients daughter is calling in with some concerns about her mother. She really wanted to talk to Dr. Sharlet Salina her self. I informed her we could not do that at this time. She could leave a message and the cma could call her back. I also asked her to make an appointment because she has not been seen since 12/2015 so that they could talk about the issues. She did make an appointment but still wanted to talk to someone. She is worried about memory loss and such of that. Please follow up with daughter Vickii Chafe. Thank you.    Peggy [phone]

## 2016-06-12 NOTE — Telephone Encounter (Signed)
Contacted peggy back and had discussion about mom and upcoming visit

## 2016-06-22 ENCOUNTER — Emergency Department (HOSPITAL_COMMUNITY)
Admission: EM | Admit: 2016-06-22 | Discharge: 2016-06-22 | Disposition: A | Discharge status: Home / Self Care | Payer: Medicare Other | Attending: Emergency Medicine | Admitting: Emergency Medicine

## 2016-06-22 ENCOUNTER — Encounter (HOSPITAL_COMMUNITY): Payer: Self-pay | Admitting: *Deleted

## 2016-06-22 ENCOUNTER — Emergency Department (HOSPITAL_COMMUNITY): Payer: Medicare Other

## 2016-06-22 DIAGNOSIS — S0990XA Unspecified injury of head, initial encounter: Secondary | ICD-10-CM

## 2016-06-22 DIAGNOSIS — Z87891 Personal history of nicotine dependence: Secondary | ICD-10-CM | POA: Insufficient documentation

## 2016-06-22 DIAGNOSIS — Z79899 Other long term (current) drug therapy: Secondary | ICD-10-CM | POA: Diagnosis not present

## 2016-06-22 DIAGNOSIS — W19XXXA Unspecified fall, initial encounter: Secondary | ICD-10-CM

## 2016-06-22 DIAGNOSIS — W01118A Fall on same level from slipping, tripping and stumbling with subsequent striking against other sharp object, initial encounter: Secondary | ICD-10-CM | POA: Diagnosis not present

## 2016-06-22 DIAGNOSIS — S0101XA Laceration without foreign body of scalp, initial encounter: Secondary | ICD-10-CM | POA: Diagnosis not present

## 2016-06-22 DIAGNOSIS — Y9289 Other specified places as the place of occurrence of the external cause: Secondary | ICD-10-CM | POA: Diagnosis not present

## 2016-06-22 DIAGNOSIS — I2510 Atherosclerotic heart disease of native coronary artery without angina pectoris: Secondary | ICD-10-CM | POA: Insufficient documentation

## 2016-06-22 DIAGNOSIS — Z853 Personal history of malignant neoplasm of breast: Secondary | ICD-10-CM | POA: Diagnosis not present

## 2016-06-22 DIAGNOSIS — Y939 Activity, unspecified: Secondary | ICD-10-CM | POA: Diagnosis not present

## 2016-06-22 DIAGNOSIS — Y999 Unspecified external cause status: Secondary | ICD-10-CM | POA: Diagnosis not present

## 2016-06-22 LAB — I-STAT CHEM 8, ED
BUN: 20 mg/dL (ref 6–20)
CHLORIDE: 105 mmol/L (ref 101–111)
CREATININE: 0.8 mg/dL (ref 0.44–1.00)
Calcium, Ion: 1.28 mmol/L (ref 1.15–1.40)
GLUCOSE: 104 mg/dL — AB (ref 65–99)
HEMATOCRIT: 44 % (ref 36.0–46.0)
HEMOGLOBIN: 15 g/dL (ref 12.0–15.0)
POTASSIUM: 3.9 mmol/L (ref 3.5–5.1)
Sodium: 144 mmol/L (ref 135–145)
TCO2: 31 mmol/L (ref 0–100)

## 2016-06-22 NOTE — ED Notes (Signed)
Pt stable, ambulatory, states understanding of discharge instructions 

## 2016-06-22 NOTE — ED Notes (Signed)
Pt ambulated to bathroom without difficulty.

## 2016-06-22 NOTE — ED Triage Notes (Signed)
The pt arrived by [facility] from [facility]..  The pt was sitting on the commode and her feet slipped out from under her and she fell into the floor., pain rt side of her head  No loc   She not on any blood thinners .  She refused long spine board and c-collar.  She refused to tell ems what meds she was on   Alert oriented  Skin warm and dry no  distress

## 2016-06-22 NOTE — ED Notes (Signed)
Patient transported to CT 

## 2016-06-22 NOTE — ED Notes (Signed)
Pt c/o sudden onset dizziness and nausea.  Reported to Baker Janus, NP prior to Dr. Venora Maples

## 2016-06-23 NOTE — ED Provider Notes (Signed)
[facility] DEPT Provider Note   CSN: [id number] Arrival date & time: 06/22/16  2013     History   Chief Complaint Chief Complaint  Patient presents with  . Fall    HPI [name] is a 83 y.o. female.  HPI Patient presents to the emergency department with complaints of all wall at her [facility] center tonight.  She states she was sitting on the commode in her feet slipped.  She reports striking the opposite wall with her right forehead.  No loss of consciousness.  No neck pain.  No pain in her upper lower extremity major joints.  She's been ambulatory in the ER.  She feels good and would like to go home.  She is not on anticoagulants.    Past Medical History:  Diagnosis Date  . Breast cancer ([place])   . CAD (coronary artery disease)    cath w/ trivial disease  . Cancer ([place])    breast  . Cataract   . Diverticulosis of colon   . Macular degeneration   . Osteoporosis    s/p compression fx T12    Patient Active Problem List   Diagnosis Date Noted  . Viral URI 10/08/2015  . Cerumen impaction 06/29/2015  . Routine general medical examination at a health care facility 01/11/2015  . Boil of upper arm and forearm 05/05/2014  . Macular degeneration 10/09/2013  . Urinary incontinence   . Cancer of overlapping sites of left female breast ([place]) 01/28/2013  . Anemia in neoplastic disease 01/13/2010  . ANXIETY, SITUATIONAL 04/07/2009  . Hypertension 04/07/2009  . Coronary atherosclerosis 12/31/2006  . Osteoporosis 12/31/2006  . GERD 11/14/2006  . OSTEOARTHROSIS, LOCAL, PRM, OTHER [facility]  SITE 11/14/2006  . Paget's disease of vulva 11/14/2006    Past Surgical History:  Procedure Laterality Date  . ABDOMINAL HYSTERECTOMY    . BREAST LUMPECTOMY    . excision for Paget's vulvar    . FLEXIBLE SIGMOIDOSCOPY N/A 06/27/2012   Procedure: FLEXIBLE SIGMOIDOSCOPY;  Surgeon: Lafayette Dragon, MD;  Location: [facility];  Service: Endoscopy;  Laterality: N/A;  . MASTECTOMY     left  . vertbroplasty     T12    OB History    No data available       Home Medications    Prior to Admission medications   Medication Sig Start Date End Date Taking? Authorizing Provider  acetaminophen (TYLENOL) 500 MG tablet Take 1,000 mg by mouth daily.     Historical Provider, MD  losartan (COZAAR) 25 MG tablet TAKE ONE TABLET BY MOUTH ONCE DAILY. 08/02/15   Jolaine Artist, MD  mirabegron ER (MYRBETRIQ) 50 MG TB24 tablet Take 1 tablet (50 mg total) by mouth daily. Patient not taking: Reported on 10/04/2015 07/11/15   Hoyt Koch, MD  Multiple Vitamins-Minerals (PRESERVISION AREDS 2 PO) Take 1 tablet by mouth 2 (two) times daily.    Historical Provider, MD  triamcinolone cream (KENALOG) 0.1 % Apply 1 application topically 2 (two) times daily as needed. 02/01/15   Everitt Amber, MD    Family History Family History  Problem Relation Age of Onset  . Hypertension Mother   . Cancer Sister     Social History Social History  Substance Use Topics  . Smoking status: Former Smoker    Quit date: 05/01/1990  . Smokeless tobacco: Never Used  . Alcohol use No     Allergies   Solifenacin succinate   Review of Systems Review of Systems  All  other systems reviewed and are negative.    Physical Exam Updated Vital Signs BP 139/81   Pulse 79   Temp 97.5 F (36.4 C) (Oral)   Resp 17   SpO2 95%   Physical Exam  Constitutional: She is oriented to person, place, and time. She appears well-developed and well-nourished.  HENT:  Head: Normocephalic and atraumatic.  Small bruise noted of her right parietal scalp.  The laceration.  Eyes: EOM are normal. Pupils are equal, round, and reactive to light.  Neck: Normal range of motion. Neck supple.  C-spine nontender  Cardiovascular: Regular rhythm.   Pulmonary/Chest: Effort normal.  Abdominal: Soft. She exhibits no distension.  Musculoskeletal: Normal range of motion.  Full ROM of bilateral upper and lower extremity major  joints  Neurological: She is alert and oriented to person, place, and time.  5/5 strength in major muscle groups of  bilateral upper and lower extremities. Speech normal. No facial asymetry.   Psychiatric: She has a normal mood and affect.  Nursing note and vitals reviewed.    ED Treatments / Results  Labs (all labs ordered are listed, but only abnormal results are displayed) Labs Reviewed  I-STAT CHEM 8, ED - Abnormal; Notable for the following:       Result Value   Glucose, Bld 104 (*)    All other components within normal limits    EKG  EKG Interpretation None       Radiology Ct Head Wo Contrast  Result Date: 06/22/2016 CLINICAL DATA:  Fall and hit right side of head nausea and dizziness EXAM: CT HEAD WITHOUT CONTRAST TECHNIQUE: Contiguous axial images were obtained from the base of the skull through the vertex without intravenous contrast. COMPARISON:  07/17/2007 FINDINGS: Brain: No acute territorial infarction, intracranial hemorrhage or focal mass lesion is visualized. Mild to moderate atrophy. Moderate periventricular, subcortical and deep white matter small vessel ischemic changes. Ventricles appropriate given degree of atrophy. Vascular: Atherosclerotic calcifications within the carotid artery's. No hyperdense vessels. Skull: No fracture.  No suspicious bone lesion. Sinuses/Orbits: No acute orbital abnormality. Mucosal thickening in the ethmoid sinuses. Other: Small right posterior parietal scalp swelling IMPRESSION: No CT evidence for acute intracranial abnormality. Atrophy and bilateral white matter small vessel ischemic changes Electronically Signed   By: Donavan Foil M.D.   On: 06/22/2016 23:05    Procedures Procedures (including critical care time)  Medications Ordered in ED Medications - No data to display   Initial Impression / Assessment and Plan / ED Course  I have reviewed the triage vital signs and the nursing notes.  Pertinent labs & imaging results that  were available during my care of the patient were reviewed by me and considered in my medical decision making (see chart for details).     Overall well-appearing.  Ambulatory.  Head CT negative.  Primary care follow-up.  Final Clinical Impressions(s) / ED Diagnoses   Final diagnoses:  Fall, initial encounter  Minor head injury, initial encounter    New Prescriptions Discharge Medication List as of 06/22/2016 11:23 PM       Jola Schmidt, MD 06/23/16 1521

## 2016-06-25 ENCOUNTER — Encounter: Payer: Self-pay | Admitting: Internal Medicine

## 2016-06-25 ENCOUNTER — Ambulatory Visit (INDEPENDENT_AMBULATORY_CARE_PROVIDER_SITE_OTHER): Payer: Medicare Other | Admitting: Internal Medicine

## 2016-06-25 DIAGNOSIS — I10 Essential (primary) hypertension: Secondary | ICD-10-CM

## 2016-06-25 DIAGNOSIS — R413 Other amnesia: Secondary | ICD-10-CM

## 2016-06-25 NOTE — Assessment & Plan Note (Signed)
Talked to them about the risk of dementia at her age which is >80%. She does not like pills and they will consider. We also talked about the possibility of a small stroke causing sudden decrease in her memory several months ago. She does have signs of dementia on exam today but denies.

## 2016-06-25 NOTE — Progress Notes (Signed)
Pre visit review using our clinic review tool, if applicable. No additional management support is needed unless otherwise documented below in the visit note. 

## 2016-06-25 NOTE — Progress Notes (Signed)
   Subjective:    Patient ID: [name], female    DOB: Nov 17, 1913, 83 y.o.   MRN: [id number]  HPI The patient is a 83 YO female coming in for follow up of ER visit (in for fall in the bathroom and injury to her head, CT head negative for acute changes). She denies headaches, nausea, vomiting since the fall. She states she tripped on some water in a bathroom stall. She denies other falls. Her children are also concerned about her memory. She is having more troubles in the last 3-4 months with short term memory. She is not able to remember meals or dates. She does not remember how to work the tv and only watches one channel.   Review of Systems  Constitutional: Positive for fatigue. Negative for activity change, appetite change, fever and unexpected weight change.  Respiratory: Negative.   Cardiovascular: Negative.   Gastrointestinal: Negative.   Musculoskeletal: Positive for arthralgias. Negative for back pain, gait problem, myalgias and neck pain.  Neurological: Negative.       Objective:   Physical Exam  Constitutional: She appears well-developed and well-nourished.  Thin  HENT:  Head: Normocephalic and atraumatic.  No tenderness of the scalp  Eyes: EOM are normal.  Neck: Normal range of motion.  Cardiovascular: Normal rate and regular rhythm.   Pulmonary/Chest: Effort normal and breath sounds normal.  Abdominal: Soft. She exhibits no distension. There is no tenderness. There is no rebound.  Musculoskeletal: She exhibits no edema.  Neurological: Coordination abnormal.  A and O times 2 (not date) Cane for ambulation  Skin:  Large bruise on the right forearm from IV access at the ER  Psychiatric: She has a normal mood and affect.   Vitals:   06/25/16 1314  BP: 100/64  Pulse: 64  Temp: 97.6 F (36.4 C)  TempSrc: Oral  SpO2: 97%  Weight: 102 lb (46.3 kg)  Height: 5' (1.524 m)      Assessment & Plan:

## 2016-06-25 NOTE — Assessment & Plan Note (Signed)
BP at goal and not high or low. Takes losartan 25 mg daily.

## 2016-06-25 NOTE — Patient Instructions (Signed)
We are not changing anything today.

## 2016-06-28 ENCOUNTER — Other Ambulatory Visit (HOSPITAL_BASED_OUTPATIENT_CLINIC_OR_DEPARTMENT_OTHER): Payer: Medicare Other

## 2016-06-28 ENCOUNTER — Ambulatory Visit (HOSPITAL_BASED_OUTPATIENT_CLINIC_OR_DEPARTMENT_OTHER): Payer: Medicare Other | Admitting: Oncology

## 2016-06-28 ENCOUNTER — Ambulatory Visit: Payer: Medicare Other

## 2016-06-28 VITALS — BP 145/78 | HR 66 | Temp 97.3°F | Resp 17 | Wt 106.6 lb

## 2016-06-28 DIAGNOSIS — Z171 Estrogen receptor negative status [ER-]: Secondary | ICD-10-CM

## 2016-06-28 DIAGNOSIS — C4499 Other specified malignant neoplasm of skin, unspecified: Secondary | ICD-10-CM

## 2016-06-28 DIAGNOSIS — Z853 Personal history of malignant neoplasm of breast: Secondary | ICD-10-CM

## 2016-06-28 DIAGNOSIS — C50812 Malignant neoplasm of overlapping sites of left female breast: Secondary | ICD-10-CM

## 2016-06-28 DIAGNOSIS — C519 Malignant neoplasm of vulva, unspecified: Secondary | ICD-10-CM

## 2016-06-28 LAB — COMPREHENSIVE METABOLIC PANEL
ALBUMIN: 4 g/dL (ref 3.5–5.0)
ALK PHOS: 65 U/L (ref 40–150)
ALT: 11 U/L (ref 0–55)
AST: 24 U/L (ref 5–34)
Anion Gap: 6 mEq/L (ref 3–11)
BUN: 16.6 mg/dL (ref 7.0–26.0)
CO2: 24 mEq/L (ref 22–29)
Calcium: 10 mg/dL (ref 8.4–10.4)
Chloride: 113 mEq/L — ABNORMAL HIGH (ref 98–109)
Creatinine: 0.8 mg/dL (ref 0.6–1.1)
EGFR: 59 mL/min/{1.73_m2} — ABNORMAL LOW (ref >90)
GLUCOSE: 97 mg/dL (ref 70–140)
POTASSIUM: 4.5 meq/L (ref 3.5–5.1)
SODIUM: 143 meq/L (ref 136–145)
TOTAL PROTEIN: 7 g/dL (ref 6.4–8.3)
Total Bilirubin: 0.57 mg/dL (ref 0.20–1.20)

## 2016-06-28 LAB — CBC WITH DIFFERENTIAL/PLATELET
BASO%: 1.1 % (ref 0.0–2.0)
Basophils Absolute: 0 10*3/uL (ref 0.0–0.1)
EOS%: 3.4 % (ref 0.0–7.0)
Eosinophils Absolute: 0.1 10*3/uL (ref 0.0–0.5)
HCT: 42.8 % (ref 34.8–46.6)
HEMOGLOBIN: 14.1 g/dL (ref 11.6–15.9)
LYMPH%: 16.3 % (ref 14.0–49.7)
MCH: 29.7 pg (ref 25.1–34.0)
MCHC: 32.9 g/dL (ref 31.5–36.0)
MCV: 90.3 fL (ref 79.5–101.0)
MONO#: 0.5 10*3/uL (ref 0.1–0.9)
MONO%: 10.7 % (ref 0.0–14.0)
NEUT%: 68.5 % (ref 38.4–76.8)
NEUTROS ABS: 3.1 10*3/uL (ref 1.5–6.5)
Platelets: 129 10*3/uL — ABNORMAL LOW (ref 145–400)
RBC: 4.73 10*6/uL (ref 3.70–5.45)
RDW: 15 % — AB (ref 11.2–14.5)
WBC: 4.5 10*3/uL (ref 3.9–10.3)
lymph#: 0.7 10*3/uL — ABNORMAL LOW (ref 0.9–3.3)

## 2016-06-28 NOTE — Progress Notes (Signed)
[place]  Telephone:(336) [phone] Fax:(336) [phone]  OFFICE PROGRESS NOTE   ID: [name]   DOB: 19-Dec-1913  MR#: [id number]  QQP#:[id number]  Cc:  Azalee Course, Glori Bickers  CHIEF COMPLAINT: metastatic breast cancer  CURRENT TREATMENT: Observation  BREAST CANCER HISTORY: From the original intake note:  [name]'s gynecologist, Dr. Josefa Half, palpated a mass in the patient's left breast in 2004. The patient has a daughter, who is a Marine scientist, formerly I believe an oncology nurse, currently a pulmonary nurse in [place], so the patient went there for her care, and while I do not have all the workup, I do have the pathology report from October 01, 2002, which was her left lumpectomy and sentinel lymph node sampling under Dr. Kemper Durie. This showed [id number]) a 2.5 cm infiltrating ductal carcinoma with lobular features (the cells were E-cadherin positive), involving one of three sentinel lymph nodes. The anterior margin was close at less than 1 mm. The patient saw Dr. Arloa Koh here, and the option of completion mastectomy versus radiotherapy alone was discussed. He felt, and I would probably have agreed, that despite the very close margin, which was nevertheless technically negative, proceeding to radiation without "clearing the margin" further would be adequate. Accordingly, the patient did receive a total of 5040 cGy with an additional 1000 cGy boost to the left breast between September 11 and February 24, 2003.   The patient then did well, but in 2007 developed some redness and bumpiness around the left areola. She brought this to Dr. Ermelinda Das attention, and apparently an initial biopsy was negative, as was a mammogram, however, with further development of the change, the patient had a biopsy under anesthesia, and this apparently was positive (I do not have those records). With this information, she proceeded to left mastectomy on February 26, 2006. The  pathology there ([id number]) showed dermal involvement by adenocarcinoma, with pagetoid spread and lymphatic space invasion. The closest margin being 1.2 cm from the inferior margin. Everything else being amply negative. There were no discrete masses or lesions identified in the left breast. The patient subsequently underwent left mastectomy.   There were a series of further skin recurrences, first in November of 2008. Initially, these were resected, but later there were too many lesions to resect, in addition to a large left axillary lymph node. This was biopsied in January 2011, showing the tumor now to be HER-2/neu positive. Patient has been on anti-HER-2/neu treatments since January 2011, initially with trastuzumab plus lapatinib. Lapatinib was discontinued in April 2011 secondary to side effects. Continues now to receive trastuzumab on a q. 3 week basis. Also receives a zoledronic acid every 3 months.    INTERVAL HISTORY: [name] returns today for follow-up of her estrogen receptor negative breast cancer, accompanied by her daughter-in-law. [name] has been receiving trastuzumab now for 7 years, with excellent tolerance. Her most recent echocardiogram in November 2017 showed an excellent ejection fraction.  She recently had her 83 birthday. There has been some progression of confusion according to the family. We are therefore considering switching to observation at this point  REVIEW OF SYSTEMS: Evani fell while in the bathroom (she was able to tell me this was not the bathroom [place] but [place] the church, and that there was some water on the floor which she had not noticed). She slipped and hit her head. She had a head CT in the emergency room which showed no evidence of bleed and incidentally no evidence of  metastatic disease. She occasionally has some discomfort in the left chest wall area but tells me this is at baseline and has not changed recently. A detailed review of systems  today was otherwise stable  PAST MEDICAL HISTORY: Past Medical History:  Diagnosis Date  . Breast cancer ([place])   . CAD (coronary artery disease)    cath w/ trivial disease  . Cancer ([place])    breast  . Cataract   . Diverticulosis of colon   . Macular degeneration   . Osteoporosis    s/p compression fx T12    PAST SURGICAL HISTORY: Past Surgical History:  Procedure Laterality Date  . ABDOMINAL HYSTERECTOMY    . BREAST LUMPECTOMY    . excision for Paget's vulvar    . FLEXIBLE SIGMOIDOSCOPY N/A 06/27/2012   Procedure: FLEXIBLE SIGMOIDOSCOPY;  Surgeon: Lafayette Dragon, MD;  Location: [facility];  Service: Endoscopy;  Laterality: N/A;  . MASTECTOMY     left  . vertbroplasty     T12    FAMILY HISTORY The patient's father died in an automobile accident at 85, the patient's mother from a stroke at 85. The patient had one brother who died from causes unknown to her at the age of 94, and a sister who had "lots of problems" and died at the age of 60, but there is no history of breast or ovarian cancer in the family.   GYNECOLOGIC HISTORY: She is GX P3. She took hormone replacement therapy briefly after menopause.   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 She has been a widow since 1997; lives independently, drives, and does all of her activities of daily living. Her daughter, [name], is a Marine scientist in [place]. Her daughter, [name], lives in [place].[name], who frequently comes with the patient to medical visits, is married to the patient's son, who is also her power of attorney. He works for the [employer]. The patient has six grandchildren and three great-grandchildren. She attends [employer].    ADVANCED DIRECTIVES: In place  HEALTH MAINTENANCE: Social History  Substance Use Topics  . Smoking status: Former Smoker    Quit date: 05/01/1990  . Smokeless tobacco: Never Used  . Alcohol  use No     Colonoscopy: 02/06/2002  PAP: Not on file  Bone density: Not on file  Lipid panel: Not on file  Allergies  Allergen Reactions  . Solifenacin Succinate     REACTION: "sick" nausea    Current Outpatient Prescriptions  Medication Sig Dispense Refill  . acetaminophen (TYLENOL) 500 MG tablet Take 1,000 mg by mouth daily.     Marland Kitchen losartan (COZAAR) 25 MG tablet TAKE ONE TABLET BY MOUTH ONCE DAILY. 90 tablet 6  . Multiple Vitamins-Minerals (PRESERVISION AREDS 2 PO) Take 1 tablet by mouth 2 (two) times daily.    Marland Kitchen triamcinolone cream (KENALOG) 0.1 % Apply 1 application topically 2 (two) times daily as needed. 80 g 1   No current facility-administered medications for this visit.     OBJECTIVE: Elderly white woman In no acute distress  Vitals:   06/28/16 1119  BP: (!) 145/78  Pulse: 66  Resp: 17  Temp: 97.3 F (36.3 C)     Body mass index is 20.82 kg/m.    ECOG FS: 1  Sclerae unicteric, pupils round and equal Oropharynx clear and moist-- no thrush or other lesions No cervical or supraclavicular adenopathy Lungs no  rales or rhonchi Heart regular rate and rhythm Abd soft, nontender, positive bowel sounds MSK kyphosis and scoliosis but no focal spinal tenderness, no upper extremity lymphedema Neuro: nonfocal, well oriented, positive affect Breasts: The right breast is unremarkable. The left breast is status post mastectomy and the chest wall is status post radiation. There is no evidence of local recurrence at present. Both axillae are benign.     LAB RESULTS: Lab Results  Component Value Date   WBC 4.5 06/28/2016   NEUTROABS 3.1 06/28/2016   HGB 14.1 06/28/2016   HCT 42.8 06/28/2016   MCV 90.3 06/28/2016   PLT 129 (L) 06/28/2016      Chemistry      Component Value Date/Time   NA 144 06/22/2016 2229   NA 141 05/30/2016 1110   K 3.9 06/22/2016 2229   K 4.7 05/30/2016 1110   CL 105 06/22/2016 2229   CL 106 10/15/2012 1136   CO2 22 05/30/2016 1110   BUN  20 06/22/2016 2229   BUN 17.1 05/30/2016 1110   CREATININE 0.80 06/22/2016 2229   CREATININE 0.8 05/30/2016 1110      Component Value Date/Time   CALCIUM 10.0 05/30/2016 1110   ALKPHOS 59 05/30/2016 1110   AST 27 05/30/2016 1110   ALT 13 05/30/2016 1110   BILITOT 0.57 05/30/2016 1110       Lab Results  Component Value Date   LABCA2 17 09/26/2011     STUDIES: Patient:    [name], [name] MR #:       [id number] Study Date: 03/27/2016 Gender:     F Age:        83 Height:     152.4 cm Weight:     45 kg BSA:        1.38 m^2 Pt. Status: Room:   ATTENDING    Rayvon Dakin, Valli Glance     Briget Shaheed, Virgie Dad  REFERRING    Chrissie Dacquisto, Virgie Dad  PERFORMING   [facility], Outpatient  SONOGRAPHER  [facility], RDCS  cc:  ------------------------------------------------------------------- LV EF: 60% -   65%  -------------------------------------------------------------------   ASSESSMENT: [name] is a 83 y.o.  [name], [name] woman:  (1) Status post leftBreast overlapping sites lumpectomy and sentinel lymph node dissection in 09/2002 for a T2 N1 (Stage II) invasive ductal carcinoma, triple negative, status post radiation therapy completed in 01/2003.  (2) Local recurrence around the areola 01/2006, leading to mastectomy.  (3) A series of further skin recurrences first noted in 03/2007. Initially these were resected, later with too many lesions to resect; there was also a large left axillary lymph node which was biopsied in 04/2009 showing the tumor now to be HER-2 positive.   (4) Since 04/2009 she has been on anti-HER-2 treatment, initially with trastuzumab plus lapatinib, with a lapatinib discontinued 07/2009 because of side effects.   (5) Recurrence of vulvar Paget's disease, originally diagnosed and resected in 2004 and recurred in 02/2012.  Followed by Dr. Paula Compton and Dr. Marti Sleigh.  (6) Continued to receive trastuzumab every 4  weeks and zoledronic acid every 3 months through Februiary 2018  (a)  Echocardiogram repeated 03/27/2016, shows an ejection fraction of 60-65%  (b) trastuzumab and zolendronate discontinued February 2018 with mild progression of dementia  (7) Nocturnal leg cramping  PLAN: Jasara is now a little over 10 years out from local recurrence of her breast cancer to her left chest wall. She received radiation for this and has been on  trastuzumab for the last 7 years.  At this point I am comfortable stopping treatment and starting observation. We discussed this at length today. She is somewhat anxious about this change. I have asked her to keep an eye on her left chest wall area and if she notices any significant change she will let us know.  Otherwise I will see her again in May. Very likely I will follow her every 6 months for at least the following year. After that we may broaden the follow-up interval  She knows to call for any problems that may develop before her next visit  Ranell Skibinski C, MD  06/28/2016 11:37 AM

## 2016-08-09 ENCOUNTER — Encounter: Payer: Self-pay | Admitting: Internal Medicine

## 2016-08-09 ENCOUNTER — Ambulatory Visit (INDEPENDENT_AMBULATORY_CARE_PROVIDER_SITE_OTHER): Payer: Medicare Other | Admitting: Internal Medicine

## 2016-08-09 DIAGNOSIS — S51802A Unspecified open wound of left forearm, initial encounter: Secondary | ICD-10-CM | POA: Diagnosis not present

## 2016-08-09 NOTE — Assessment & Plan Note (Signed)
No signs of infection. We talked about covering with bandages for 2-3 days in between changing. Appears to be healing with some granulation tissue. Can use antibiotic ointment to prevent bandage sticking to healing tissue. Can soak bandage prior to removal to avoid sticking as well.

## 2016-08-09 NOTE — Progress Notes (Signed)
Pre visit review using our clinic review tool, if applicable. No additional management support is needed unless otherwise documented below in the visit note. 

## 2016-08-09 NOTE — Progress Notes (Signed)
   Subjective:    Patient ID: [name], female    DOB: 29-Dec-1913, 83 y.o.   MRN: [id number]  HPI The patient is a 83 YO female coming in for non-healing wound on her left arm. About 2 weeks ago at home she fell against the wall at home. Did not hit her head and no LOC. They did not seek care for that episode. They have been keeping it covered and used some antibiotic cream on it the first week or so and have not needed lately. No fevers or chills. She has not fallen again and is trying to be careful. Some clear discharge and appears to have started healing.   Review of Systems  Constitutional: Positive for activity change. Negative for appetite change, fatigue, fever and unexpected weight change.  Respiratory: Negative.   Cardiovascular: Negative.   Gastrointestinal: Negative.   Musculoskeletal: Positive for gait problem. Negative for arthralgias, back pain, myalgias, neck pain and neck stiffness.  Skin: Positive for wound.      Objective:   Physical Exam  Constitutional: She appears well-developed and well-nourished.  Frail  HENT:  Head: Normocephalic and atraumatic.  Eyes: EOM are normal.  Neck: Normal range of motion.  Cardiovascular: Normal rate and regular rhythm.   Pulmonary/Chest: Effort normal and breath sounds normal.  Abdominal: Soft.  Neurological: She is alert.  Memory poor  Skin: Skin is warm and dry.  1 cm circular wound on the left forearm, some granulation tissue, no discharge and no abscess formation. No signs of redness or infection around the wound   Vitals:   08/09/16 1257  BP: 136/80  Pulse: 66  Resp: 12  Temp: 97.6 F (36.4 C)  TempSrc: Oral  SpO2: 94%  Weight: 105 lb 4 oz (47.7 kg)  Height: 5' (1.524 m)      Assessment & Plan:

## 2016-08-09 NOTE — Patient Instructions (Signed)
It is okay to change the bandage every 2-3 days.   You do not have to use any creams but if you want to just use an antibiotic ointment to help prevent sticking to the skin.   It is okay to soak the bandage with water to help it loosen before changing it.   This looks okay today and not infected. If there is a lot of swelling or redness call us or come back to check the wound.

## 2016-09-13 ENCOUNTER — Ambulatory Visit (HOSPITAL_BASED_OUTPATIENT_CLINIC_OR_DEPARTMENT_OTHER): Payer: Medicare Other | Admitting: Oncology

## 2016-09-13 ENCOUNTER — Other Ambulatory Visit (HOSPITAL_BASED_OUTPATIENT_CLINIC_OR_DEPARTMENT_OTHER): Payer: Medicare Other

## 2016-09-13 VITALS — BP 171/87 | HR 69 | Temp 97.8°F | Resp 18 | Ht 60.0 in | Wt 106.6 lb

## 2016-09-13 DIAGNOSIS — F0390 Unspecified dementia without behavioral disturbance: Secondary | ICD-10-CM

## 2016-09-13 DIAGNOSIS — C4499 Other specified malignant neoplasm of skin, unspecified: Secondary | ICD-10-CM

## 2016-09-13 DIAGNOSIS — C50812 Malignant neoplasm of overlapping sites of left female breast: Secondary | ICD-10-CM

## 2016-09-13 DIAGNOSIS — C50912 Malignant neoplasm of unspecified site of left female breast: Secondary | ICD-10-CM

## 2016-09-13 DIAGNOSIS — Z853 Personal history of malignant neoplasm of breast: Secondary | ICD-10-CM

## 2016-09-13 DIAGNOSIS — C519 Malignant neoplasm of vulva, unspecified: Secondary | ICD-10-CM

## 2016-09-13 LAB — CBC WITH DIFFERENTIAL/PLATELET
BASO%: 0.4 % (ref 0.0–2.0)
Basophils Absolute: 0 10*3/uL (ref 0.0–0.1)
EOS%: 3.3 % (ref 0.0–7.0)
Eosinophils Absolute: 0.2 10*3/uL (ref 0.0–0.5)
HCT: 44.6 % (ref 34.8–46.6)
HGB: 14.3 g/dL (ref 11.6–15.9)
LYMPH%: 18.4 % (ref 14.0–49.7)
MCH: 30 pg (ref 25.1–34.0)
MCHC: 32.1 g/dL (ref 31.5–36.0)
MCV: 93.7 fL (ref 79.5–101.0)
MONO#: 0.6 10*3/uL (ref 0.1–0.9)
MONO%: 11.1 % (ref 0.0–14.0)
NEUT%: 66.8 % (ref 38.4–76.8)
NEUTROS ABS: 3.7 10*3/uL (ref 1.5–6.5)
Platelets: 151 10*3/uL (ref 145–400)
RBC: 4.76 10*6/uL (ref 3.70–5.45)
RDW: 14.7 % — AB (ref 11.2–14.5)
WBC: 5.5 10*3/uL (ref 3.9–10.3)
lymph#: 1 10*3/uL (ref 0.9–3.3)

## 2016-09-13 LAB — COMPREHENSIVE METABOLIC PANEL
ALBUMIN: 4.1 g/dL (ref 3.5–5.0)
ALK PHOS: 57 U/L (ref 40–150)
ALT: 14 U/L (ref 0–55)
AST: 24 U/L (ref 5–34)
Anion Gap: 8 mEq/L (ref 3–11)
BILIRUBIN TOTAL: 0.72 mg/dL (ref 0.20–1.20)
BUN: 19.8 mg/dL (ref 7.0–26.0)
CO2: 28 mEq/L (ref 22–29)
CREATININE: 1.1 mg/dL (ref 0.6–1.1)
Calcium: 10.7 mg/dL — ABNORMAL HIGH (ref 8.4–10.4)
Chloride: 107 mEq/L (ref 98–109)
EGFR: 41 mL/min/{1.73_m2} — AB (ref >90)
Glucose: 92 mg/dl (ref 70–140)
Potassium: 4.3 mEq/L (ref 3.5–5.1)
SODIUM: 143 meq/L (ref 136–145)
TOTAL PROTEIN: 7.4 g/dL (ref 6.4–8.3)

## 2016-09-13 NOTE — Progress Notes (Signed)
[place]  Telephone:(336) [phone] Fax:(336) [phone]  OFFICE PROGRESS NOTE   ID: TAISLEY MORDAN   DOB: 81-13-1915  MR#: [id number]  JME#:[id number]  Cc:  Azalee Course, Glori Bickers  CHIEF COMPLAINT: metastatic breast cancer  CURRENT TREATMENT: Observation  BREAST CANCER HISTORY: From the original intake note:  Mrs. Lingo's gynecologist, Dr. Josefa Half, palpated a mass in the patient's left breast in 2004. The patient has a daughter, who is a Marine scientist, formerly I believe an oncology nurse, currently a pulmonary nurse in [place], so the patient went there for her care, and while I do not have all the workup, I do have the pathology report from October 01, 2002, which was her left lumpectomy and sentinel lymph node sampling under Dr. Kemper Durie. This showed [phone]) a 2.5 cm infiltrating ductal carcinoma with lobular features (the cells were E-cadherin positive), involving one of three sentinel lymph nodes. The anterior margin was close at less than 1 mm. The patient saw Dr. Arloa Koh here, and the option of completion mastectomy versus radiotherapy alone was discussed. He felt, and I would probably have agreed, that despite the very close margin, which was nevertheless technically negative, proceeding to radiation without "clearing the margin" further would be adequate. Accordingly, the patient did receive a total of 5040 cGy with an additional 1000 cGy boost to the left breast between September 11 and February 24, 2003.   The patient then did well, but in 2007 developed some redness and bumpiness around the left areola. She brought this to Dr. Ermelinda Das attention, and apparently an initial biopsy was negative, as was a mammogram, however, with further development of the change, the patient had a biopsy under anesthesia, and this apparently was positive (I do not have those records). With this information, she proceeded to left mastectomy on February 26, 2006. The  pathology there ([id number]) showed dermal involvement by adenocarcinoma, with pagetoid spread and lymphatic space invasion. The closest margin being 1.2 cm from the inferior margin. Everything else being amply negative. There were no discrete masses or lesions identified in the left breast. The patient subsequently underwent left mastectomy.   There were a series of further skin recurrences, first in November of 2008. Initially, these were resected, but later there were too many lesions to resect, in addition to a large left axillary lymph node. This was biopsied in January 2011, showing the tumor now to be HER-2/neu positive. Patient has been on anti-HER-2/neu treatments since January 2011, initially with trastuzumab plus lapatinib. Lapatinib was discontinued in April 2011 secondary to side effects. Continues now to receive trastuzumab on a q. 3 week basis. Also receives a zoledronic acid every 3 months.    INTERVAL HISTORY: Syvilla returns today for follow-up of her HER-2/neu positive metastatic breast cancer accompanied by her daughter-in-law. We stopped her Herceptin in March. She feels really good. She is walking every day at least 2-1/2 circles [place].  She says she has asked some of her neighbors to walk with her but they'll complain that she walks too fast.  REVIEW OF SYSTEMS: Zakkiyya tells me she is "perfect", except for a little bit of urinary incontinence which is not a new problem. Of course she bruises easily. While Nanie feels really well her daughter-in-law tells me on the side that she is showing some paranoid ideation about people at [employer] [place]. Overall though a detailed review of systems today was stable  PAST MEDICAL HISTORY: Past Medical History:  Diagnosis Date  .  Breast cancer ([place])   . CAD (coronary artery disease)    cath w/ trivial disease  . Cancer ([place])    breast  . Cataract   . Diverticulosis of colon   . Macular degeneration   .  Osteoporosis    s/p compression fx T12    PAST SURGICAL HISTORY: Past Surgical History:  Procedure Laterality Date  . ABDOMINAL HYSTERECTOMY    . BREAST LUMPECTOMY    . excision for Paget's vulvar    . FLEXIBLE SIGMOIDOSCOPY N/A 06/27/2012   Procedure: FLEXIBLE SIGMOIDOSCOPY;  Surgeon: Lafayette Dragon, MD;  Location: [facility];  Service: Endoscopy;  Laterality: N/A;  . MASTECTOMY     left  . vertbroplasty     T12    FAMILY HISTORY The patient's father died in an automobile accident at 81, the patient's mother from a stroke at 81. The patient had one brother who died from causes unknown to her at the age of 81, and a sister who had "lots of problems" and died at the age of 81, but there is no history of breast or ovarian cancer in the family.   GYNECOLOGIC HISTORY: She is GX P3. She took hormone replacement therapy briefly after menopause.   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 She has been a widow since 1997; lives independently, drives, and does all of her activities of daily living. Her daughter, [name], is a Marine scientist in [place]. Her daughter, [name], lives in [place].[name], who frequently comes with the patient to medical visits, is married to the patient's son, who is also her power of attorney. He works for the [employer]. The patient has six grandchildren and three great-grandchildren. She attends [employer].    ADVANCED DIRECTIVES: In place  HEALTH MAINTENANCE: Social History  Substance Use Topics  . Smoking status: Former Smoker    Quit date: 05/01/1990  . Smokeless tobacco: Never Used  . Alcohol use No     Colonoscopy: 02/06/2002  PAP: Not on file  Bone density: Not on file  Lipid panel: Not on file  Allergies  Allergen Reactions  . Solifenacin Succinate     REACTION: "sick" nausea    Current Outpatient Prescriptions  Medication Sig Dispense Refill  .  acetaminophen (TYLENOL) 500 MG tablet Take 1,000 mg by mouth daily.     Marland Kitchen losartan (COZAAR) 25 MG tablet TAKE ONE TABLET BY MOUTH ONCE DAILY. 90 tablet 6  . Multiple Vitamins-Minerals (PRESERVISION AREDS 2 PO) Take 1 tablet by mouth 2 (two) times daily.    Marland Kitchen triamcinolone cream (KENALOG) 0.1 % Apply 1 application topically 2 (two) times daily as needed. 80 g 1   No current facility-administered medications for this visit.     OBJECTIVE: Elderly white womanWho appears well  Vitals:   09/13/16 1241  BP: (!) 171/87  Pulse: 69  Resp: 18  Temp: 97.8 F (36.6 C)     Body mass index is 20.82 kg/m.    ECOG FS: 0  Sclerae unicteric, EOMs intact Oropharynx clear and moist No cervical or supraclavicular adenopathy Lungs no rales or rhonchi Heart regular rate and rhythm Abd soft, nontender, positive bowel sounds MSK no focal spinal tenderness, no upper extremity lymphedema Neuro: nonfocal, well oriented, appropriate affect Breasts: The right breast is benign. The left breast is status post mastectomy. There is no evidence of chest wall recurrence at this point. Both axillae  are benign.   LAB RESULTS: Lab Results  Component Value Date   WBC 5.5 09/13/2016   NEUTROABS 3.7 09/13/2016   HGB 14.3 09/13/2016   HCT 44.6 09/13/2016   MCV 93.7 09/13/2016   PLT 151 09/13/2016      Chemistry      Component Value Date/Time   NA 143 06/28/2016 1058   K 4.5 06/28/2016 1058   CL 105 06/22/2016 2229   CL 106 10/15/2012 1136   CO2 24 06/28/2016 1058   BUN 16.6 06/28/2016 1058   CREATININE 0.8 06/28/2016 1058      Component Value Date/Time   CALCIUM 10.0 06/28/2016 1058   ALKPHOS 65 06/28/2016 1058   AST 24 06/28/2016 1058   ALT 11 06/28/2016 1058   BILITOT 0.57 06/28/2016 1058       Lab Results  Component Value Date   LABCA2 17 09/26/2011     STUDIES: Most recent echocardiogram was 03/27/2016, with an ejection fraction of 60-65%  ASSESSMENT: Ms. Sivak is a 83 y.o.   [place], [place] woman:  (1) Status post leftBreast overlapping sites lumpectomy and sentinel lymph node dissection in 09/2002 for a T2 N1 (Stage II) invasive ductal carcinoma, triple negative, status post radiation therapy completed in 01/2003.  (2) Local recurrence around the areola 01/2006, leading to mastectomy.  (3) A series of further skin recurrences first noted in 03/2007. Initially these were resected, later with too many lesions to resect; there was also a large left axillary lymph node which was biopsied in 04/2009 showing the tumor now to be HER-2 positive.   (4) Since 04/2009 she has been on anti-HER-2 treatment, initially with trastuzumab plus lapatinib, with a lapatinib discontinued 07/2009 because of side effects.   (5) Recurrence of vulvar Paget's disease, originally diagnosed and resected in 2004 and recurred in 02/2012.  Followed by Dr. Paula Compton and Dr. Marti Sleigh.  (6) Continued to receive trastuzumab every 4 weeks and zoledronic acid every 3 months through Februiary 2018  (a)  Echocardiogram repeated 03/27/2016, shows an ejection fraction of 60-65%  (b) trastuzumab and zolendronate discontinued February 2018 with mild progression of dementia  (7) Nocturnal leg cramping  PLAN: Mirtha is now 11 years out from pathologically documented recurrence of her breast cancer, with no evidence of active disease. She is been off treatment since February 2018.  There is no evidence of disease activity or progression.  Her daughter-in-law is concerned regarding paranoid ideation. I think being put in a completely new environment at her age is a major stress and she is actually I think handling it pretty well. I suggested structure namely do everything the same every day and suggested she find a walking companion that she can speak with an spent some time with.  Otherwise I will start seeing her on a six-month basis from now. She knows to call for any  problems that may develop before her next visit here, which incidentally will be around the time of her 89st birthday.  Chauncey Cruel, MD  09/13/2016 12:54 PM

## 2016-09-27 ENCOUNTER — Encounter: Payer: Self-pay | Admitting: Internal Medicine

## 2016-10-23 ENCOUNTER — Ambulatory Visit: Payer: Medicare Other | Admitting: Internal Medicine

## 2016-11-16 ENCOUNTER — Other Ambulatory Visit: Payer: Self-pay

## 2016-11-26 ENCOUNTER — Ambulatory Visit: Payer: Medicare Other | Admitting: Nurse Practitioner

## 2016-11-30 ENCOUNTER — Ambulatory Visit: Payer: Medicare Other | Admitting: Internal Medicine

## 2016-12-04 ENCOUNTER — Ambulatory Visit: Payer: Medicare Other | Admitting: Internal Medicine

## 2016-12-24 ENCOUNTER — Ambulatory Visit: Payer: Medicare Other | Admitting: Internal Medicine

## 2017-02-18 ENCOUNTER — Telehealth: Payer: Self-pay | Admitting: Oncology

## 2017-02-18 NOTE — Telephone Encounter (Signed)
Spoke with patient's daughter and cancelled her appt in [place]. They did not want to come in.

## 2017-03-28 ENCOUNTER — Other Ambulatory Visit: Payer: Medicare Other

## 2017-03-28 ENCOUNTER — Ambulatory Visit: Payer: Medicare Other | Admitting: Oncology

## 2017-04-03 ENCOUNTER — Ambulatory Visit: Payer: Medicare Other | Admitting: Physical Therapy

## 2017-04-13 ENCOUNTER — Emergency Department (HOSPITAL_COMMUNITY)
Admission: EM | Admit: 2017-04-13 | Discharge: 2017-04-13 | Disposition: A | Discharge status: Home / Self Care | Payer: Medicare Other | Attending: Emergency Medicine | Admitting: Emergency Medicine

## 2017-04-13 ENCOUNTER — Emergency Department (HOSPITAL_COMMUNITY): Payer: Medicare Other

## 2017-04-13 DIAGNOSIS — Y939 Activity, unspecified: Secondary | ICD-10-CM | POA: Insufficient documentation

## 2017-04-13 DIAGNOSIS — Y92129 Unspecified place in nursing home as the place of occurrence of the external cause: Secondary | ICD-10-CM | POA: Insufficient documentation

## 2017-04-13 DIAGNOSIS — I10 Essential (primary) hypertension: Secondary | ICD-10-CM | POA: Diagnosis not present

## 2017-04-13 DIAGNOSIS — Z87891 Personal history of nicotine dependence: Secondary | ICD-10-CM | POA: Diagnosis not present

## 2017-04-13 DIAGNOSIS — Z79899 Other long term (current) drug therapy: Secondary | ICD-10-CM | POA: Diagnosis not present

## 2017-04-13 DIAGNOSIS — C50812 Malignant neoplasm of overlapping sites of left female breast: Secondary | ICD-10-CM | POA: Diagnosis not present

## 2017-04-13 DIAGNOSIS — S0990XA Unspecified injury of head, initial encounter: Secondary | ICD-10-CM | POA: Insufficient documentation

## 2017-04-13 DIAGNOSIS — W19XXXA Unspecified fall, initial encounter: Secondary | ICD-10-CM | POA: Insufficient documentation

## 2017-04-13 DIAGNOSIS — Y999 Unspecified external cause status: Secondary | ICD-10-CM | POA: Diagnosis not present

## 2017-04-13 DIAGNOSIS — F0390 Unspecified dementia without behavioral disturbance: Secondary | ICD-10-CM | POA: Insufficient documentation

## 2017-04-13 DIAGNOSIS — I259 Chronic ischemic heart disease, unspecified: Secondary | ICD-10-CM | POA: Diagnosis not present

## 2017-04-13 LAB — CBG MONITORING, ED: GLUCOSE-CAPILLARY: 83 mg/dL (ref 65–99)

## 2017-04-13 NOTE — ED Provider Notes (Signed)
[facility] DEPT Provider Note   CSN: [id number] Arrival date & time: 04/13/17  0830     History   Chief Complaint Chief Complaint  Patient presents with  . Fall    HPI [name] is a 83 y.o. female.  HPI   83 year old female presents with concern for unwitnessed fall.  Estimate the fall occurred between 6 and 8 AM.  She is on down to 8 AM.  Patient does not remember the fall.  She has a history of dementia.  Patient had reported some shoulder pain from nursing, but on my evaluation, denies any pain.  Including no headache, shoulder pain, or other symptoms.     Level 5 caveat, dementia  Past Medical History:  Diagnosis Date  . Breast cancer ([place])   . CAD (coronary artery disease)    cath w/ trivial disease  . Cancer ([place])    breast  . Cataract   . Diverticulosis of colon   . Macular degeneration   . Osteoporosis    s/p compression fx T12    Patient Active Problem List   Diagnosis Date Noted  . Open wound of left forearm without complication 08/67/6195  . Memory change 06/25/2016  . Routine general medical examination at a health care facility 01/11/2015  . Macular degeneration 10/09/2013  . Urinary incontinence   . Cancer of overlapping sites of left female breast ([place]) 01/28/2013  . Anemia in neoplastic disease 01/13/2010  . ANXIETY, SITUATIONAL 04/07/2009  . Hypertension 04/07/2009  . Recurrent breast cancer, left ([place]) 01/13/2008  . Coronary atherosclerosis 12/31/2006  . Osteoporosis 12/31/2006  . Paget's disease of vulva 11/14/2006    Past Surgical History:  Procedure Laterality Date  . ABDOMINAL HYSTERECTOMY    . BREAST LUMPECTOMY    . excision for Paget's vulvar    . FLEXIBLE SIGMOIDOSCOPY N/A 06/27/2012   Procedure: FLEXIBLE SIGMOIDOSCOPY;  Surgeon: Lafayette Dragon, MD;  Location: [facility];  Service: Endoscopy;  Laterality: N/A;  . MASTECTOMY     left  . vertbroplasty     T12    OB History    No  data available       Home Medications    Prior to Admission medications   Medication Sig Start Date End Date Taking? Authorizing Provider  acetaminophen (TYLENOL) 325 MG tablet Take 650 mg by mouth every 6 (six) hours as needed for pain.   Yes [provider]  cephALEXin (KEFLEX) 500 MG capsule Take 1 capsule by mouth every 6 (six) hours. 10 day course starting 04/10/17 04/09/17  Yes [provider]  citalopram (CELEXA) 20 MG tablet Take 1 tablet by mouth daily. 03/24/17  Yes [provider]  divalproex (DEPAKOTE SPRINKLE) 125 MG capsule Take 250 mg by mouth 2 (two) times daily. 01/25/17  Yes [provider]  LORazepam (ATIVAN) 2 MG/ML concentrated solution Take 2 mg by mouth daily as needed for agitation. 01/29/17  Yes [provider]  risperiDONE (RISPERDAL) 1 MG tablet Take 1 tablet by mouth 2 (two) times daily. 04/03/17  Yes [provider]  Skin Protectants, Misc. (EUCERIN) cream Apply 1 application topically 2 (two) times daily. 01/25/17 01/25/18 Yes [provider]  traZODone (DESYREL) 50 MG tablet Take 1 tablet by mouth at bedtime as needed. 01/25/17  Yes [provider]    Family History Family History  Problem Relation Age of Onset  . Hypertension Mother   . Cancer Sister     Social History  Social History   Tobacco Use  . Smoking status: Former Smoker    Last attempt to quit: 05/01/1990    Years since quitting: 26.9  . Smokeless tobacco: Never Used  Substance Use Topics  . Alcohol use: No  . Drug use: No     Allergies   Solifenacin succinate   Review of Systems Review of Systems  Unable to perform ROS: Dementia  Constitutional: Negative for fever.  HENT: Negative for sore throat.   Respiratory: Negative for cough and shortness of breath.   Cardiovascular: Negative for chest pain.  Gastrointestinal: Negative for abdominal pain.  Musculoskeletal: Negative for arthralgias and back pain.  Skin:  Negative for rash.  Neurological: Negative for syncope and headaches.     Physical Exam Updated Vital Signs BP (!) 183/98 (BP Location: Right Arm)   Pulse 65   Temp (!) 97.2 F (36.2 C) (Oral)   Resp 20   Ht 5\' 5"  (1.651 m)   Wt 48.1 kg (106 lb)   SpO2 98%   BMI 17.64 kg/m   Physical Exam  Constitutional: She appears well-developed and well-nourished. No distress.  HENT:  Head: Normocephalic and atraumatic.  Eyes: Conjunctivae and EOM are normal.  Neck: Normal range of motion.  Cardiovascular: Normal rate, regular rhythm, normal heart sounds and intact distal pulses. Exam reveals no gallop and no friction rub.  No murmur heard. Pulmonary/Chest: Effort normal and breath sounds normal. No respiratory distress. She has no wheezes. She has no rales.  Abdominal: Soft. She exhibits no distension. There is no tenderness. There is no guarding.  Musculoskeletal: She exhibits no edema or tenderness.       Right shoulder: She exhibits normal range of motion and no tenderness.       Left shoulder: She exhibits normal range of motion and no tenderness.       Right hip: She exhibits no bony tenderness.       Left hip: She exhibits no bony tenderness.       Cervical back: Bony tenderness: does not answer when asking about tenderness to neck.  Neurological: She is alert.  Oriented to self, asks location, reports it is Sunday, does not answer when asked year.  Skin: Skin is warm and dry. No rash noted. She is not diaphoretic. No erythema.  Nursing note and vitals reviewed.    ED Treatments / Results  Labs (all labs ordered are listed, but only abnormal results are displayed) Labs Reviewed  CBG MONITORING, ED    EKG  EKG Interpretation None       Radiology Ct Head Wo Contrast  Result Date: 04/13/2017 CLINICAL DATA:  Fall EXAM: CT HEAD WITHOUT CONTRAST CT CERVICAL SPINE WITHOUT CONTRAST TECHNIQUE: Multidetector CT imaging of the head and cervical spine was performed following  the standard protocol without intravenous contrast. Multiplanar CT image reconstructions of the cervical spine were also generated. COMPARISON:  06/22/2016 FINDINGS: CT HEAD FINDINGS Brain: There are prominent chronic ischemic changes in the periventricular white matter. Global atrophy is noted. There is no mass effect, midline shift, or acute hemorrhage. Vascular: No hyperdense vessel or unexpected calcification. Skull: Cranium is intact. Sinuses/Orbits: There is a small amount of fluid in the right mastoid air cells. This is a stable finding. There is some opacification of the right middle ethmoid air cells which is unchanged. Orbits are within normal limits. There is no obvious vitreous or orbital hemorrhage. Other: Noncontributory. CT CERVICAL SPINE FINDINGS Alignment: There is 2 mm anterolisthesis C2 upon  C3. There is 2 mm anterolisthesis C3 upon C4. There is otherwise grossly anatomic alignment. Skull base and vertebrae: There is no acute fracture or obvious dislocation. There is noted to be marked dilatation of the left C3-4 neural foramen as well as the foramen transversarium. Multilevel advanced facet arthropathy is present throughout the cervical spine. Soft tissues and spinal canal: There is no obvious spinal hematoma. There is no obvious soft tissue hematoma within the neck. Atherosclerotic calcifications of the carotid arteries are noted. Disc levels: There is severe disc space narrowing at C3-4, C4-5, C5-6, and C6-7. There is some posterior osteophytic ridging at these levels. There is also some degree of uncovertebral osteophyte formation. There is no obvious spinal stenosis within the central canal. Upper chest: There is some scarring at the lung apices. Other: Noncontributory. IMPRESSION: Head: No acute intracranial pathology. Global atrophy and chronic ischemic changes are noted Cervical spine: No definite acute bony injury in the cervical spine. Advanced degenerative changes are noted There is  marked dilatation of the left neural foramen and foramen transversarium at the C3-4 level. Differential diagnosis includes aneurysm of the vertebral artery for nerve sheath tumor with remodeling of the bony framework. Consider MRI or a CT with contrast to further characterize. Electronically Signed   By: Marybelle Killings M.D.   On: 04/13/2017 09:41   Ct Cervical Spine Wo Contrast  Result Date: 04/13/2017 CLINICAL DATA:  Fall EXAM: CT HEAD WITHOUT CONTRAST CT CERVICAL SPINE WITHOUT CONTRAST TECHNIQUE: Multidetector CT imaging of the head and cervical spine was performed following the standard protocol without intravenous contrast. Multiplanar CT image reconstructions of the cervical spine were also generated. COMPARISON:  06/22/2016 FINDINGS: CT HEAD FINDINGS Brain: There are prominent chronic ischemic changes in the periventricular white matter. Global atrophy is noted. There is no mass effect, midline shift, or acute hemorrhage. Vascular: No hyperdense vessel or unexpected calcification. Skull: Cranium is intact. Sinuses/Orbits: There is a small amount of fluid in the right mastoid air cells. This is a stable finding. There is some opacification of the right middle ethmoid air cells which is unchanged. Orbits are within normal limits. There is no obvious vitreous or orbital hemorrhage. Other: Noncontributory. CT CERVICAL SPINE FINDINGS Alignment: There is 2 mm anterolisthesis C2 upon C3. There is 2 mm anterolisthesis C3 upon C4. There is otherwise grossly anatomic alignment. Skull base and vertebrae: There is no acute fracture or obvious dislocation. There is noted to be marked dilatation of the left C3-4 neural foramen as well as the foramen transversarium. Multilevel advanced facet arthropathy is present throughout the cervical spine. Soft tissues and spinal canal: There is no obvious spinal hematoma. There is no obvious soft tissue hematoma within the neck. Atherosclerotic calcifications of the carotid arteries  are noted. Disc levels: There is severe disc space narrowing at C3-4, C4-5, C5-6, and C6-7. There is some posterior osteophytic ridging at these levels. There is also some degree of uncovertebral osteophyte formation. There is no obvious spinal stenosis within the central canal. Upper chest: There is some scarring at the lung apices. Other: Noncontributory. IMPRESSION: Head: No acute intracranial pathology. Global atrophy and chronic ischemic changes are noted Cervical spine: No definite acute bony injury in the cervical spine. Advanced degenerative changes are noted There is marked dilatation of the left neural foramen and foramen transversarium at the C3-4 level. Differential diagnosis includes aneurysm of the vertebral artery for nerve sheath tumor with remodeling of the bony framework. Consider MRI or a CT with contrast to further  characterize. Electronically Signed   By: Marybelle Killings M.D.   On: 04/13/2017 09:41    Procedures Procedures (including critical care time)  Medications Ordered in ED Medications - No data to display   Initial Impression / Assessment and Plan / ED Course  I have reviewed the triage vital signs and the nursing notes.  Pertinent labs & imaging results that were available during my care of the patient were reviewed by me and considered in my medical decision making (see chart for details).     83 year old female presents with concern for unwitnessed fall.  Discussed with son goals of care, whether imaging is within goals given patient at baseline, well appearing, moving all 4 extremities normally, denying pain.  He would like to have imaging given pt here in ED.  Will obtain head and neck CT given unwitnessed fall, age, patient not reliably given answers regarding tenderness of CSpine on exam.   Imaging shows no sign of acute traumatic injury. There is inciidental note of left neural foarmen dilation, with ddx including aneurysm or nerve sheath tumor. Discussed with son  possibility of outpatient imagine, however given age he agrees that they provide expectant management. Patient discharged in stable condition with understanding of reasons to return.   Final Clinical Impressions(s) / ED Diagnoses   Final diagnoses:  Fall, initial encounter    ED Discharge Orders    None       Gareth Morgan, MD 04/13/17 1028

## 2017-04-13 NOTE — ED Triage Notes (Signed)
Per EMS, patient comes from [facility] for an unwitnessed fall between 6-8am. Was found on floor at 8am. Unknown LOC, EMS reported a mark on her R cheek. No blood thinners. Pt c/o R shoulder pain; no deformities noted. Left arm edema present previously. Hx dementia, full code.

## 2017-04-13 NOTE — ED Notes (Signed)
Bed: WA23 Expected date:  Expected time:  Means of arrival:  Comments: EMS 

## 2017-04-13 NOTE — ED Notes (Signed)
[facility] called for transport.  

## 2017-04-13 NOTE — ED Notes (Signed)
[facility] aware patient is being discharged back to facility.

## 2017-04-17 ENCOUNTER — Emergency Department (HOSPITAL_COMMUNITY)
Admission: EM | Admit: 2017-04-17 | Discharge: 2017-04-17 | Disposition: A | Discharge status: Skilled Nursing Facility | Payer: Medicare Other | Attending: Emergency Medicine | Admitting: Emergency Medicine

## 2017-04-17 ENCOUNTER — Encounter (HOSPITAL_COMMUNITY): Payer: Self-pay | Admitting: Emergency Medicine

## 2017-04-17 ENCOUNTER — Emergency Department (HOSPITAL_COMMUNITY): Payer: Medicare Other

## 2017-04-17 DIAGNOSIS — W0110XA Fall on same level from slipping, tripping and stumbling with subsequent striking against unspecified object, initial encounter: Secondary | ICD-10-CM | POA: Insufficient documentation

## 2017-04-17 DIAGNOSIS — I2510 Atherosclerotic heart disease of native coronary artery without angina pectoris: Secondary | ICD-10-CM | POA: Insufficient documentation

## 2017-04-17 DIAGNOSIS — I10 Essential (primary) hypertension: Secondary | ICD-10-CM | POA: Insufficient documentation

## 2017-04-17 DIAGNOSIS — Y92129 Unspecified place in nursing home as the place of occurrence of the external cause: Secondary | ICD-10-CM | POA: Insufficient documentation

## 2017-04-17 DIAGNOSIS — Z853 Personal history of malignant neoplasm of breast: Secondary | ICD-10-CM | POA: Insufficient documentation

## 2017-04-17 DIAGNOSIS — R51 Headache: Secondary | ICD-10-CM | POA: Insufficient documentation

## 2017-04-17 DIAGNOSIS — Y999 Unspecified external cause status: Secondary | ICD-10-CM | POA: Diagnosis not present

## 2017-04-17 DIAGNOSIS — Z87891 Personal history of nicotine dependence: Secondary | ICD-10-CM | POA: Diagnosis not present

## 2017-04-17 DIAGNOSIS — Y9389 Activity, other specified: Secondary | ICD-10-CM | POA: Diagnosis not present

## 2017-04-17 DIAGNOSIS — Z79899 Other long term (current) drug therapy: Secondary | ICD-10-CM | POA: Diagnosis not present

## 2017-04-17 DIAGNOSIS — W19XXXA Unspecified fall, initial encounter: Secondary | ICD-10-CM

## 2017-04-17 NOTE — ED Notes (Signed)
Bed: [facility] Expected date:  Expected time:  Means of arrival:  Comments: EMS 101 fall

## 2017-04-17 NOTE — Discharge Instructions (Signed)
Return to the emergency department for any worsening pain, falls, vomiting, chest pain or any other worsening or concerning symptoms.

## 2017-04-17 NOTE — ED Provider Notes (Signed)
[facility] DEPT Provider Note   CSN: [id number] Arrival date & time: 04/17/17  1001     History   Chief Complaint Chief Complaint  Patient presents with  . Fall    HPI [name] is a 83 y.o. female brought in by EMS from [facility] who presents for evaluation of fall.  Per EMS, patient was attempting to get up from her wheelchair with the nursing staff when she leaned too far forward and hit the wall on the front portion of her head.  The entire situation was witnessed by nursing home staff.  They deny any LOC.  Patient was immediately able to sit back down into the wheelchair.  Nursing home called EMS because they noticed some redness over the posterior scalp.  Patient is not on blood thinners.  He denies a history of dementia and his baseline ANO x1.  Patient has had no changes from baseline.  EMS noted in route concern for irregular heartbeat.  They did a 4-lead and noticed some sinus arrhythmia but did not appear irregular.   The history is provided by the patient.    Past Medical History:  Diagnosis Date  . Breast cancer ([place])   . CAD (coronary artery disease)    cath w/ trivial disease  . Cancer ([place])    breast  . Cataract   . Diverticulosis of colon   . Macular degeneration   . Osteoporosis    s/p compression fx T12    Patient Active Problem List   Diagnosis Date Noted  . Open wound of left forearm without complication 81/82/9937  . Memory change 06/25/2016  . Routine general medical examination at a health care facility 01/11/2015  . Macular degeneration 10/09/2013  . Urinary incontinence   . Cancer of overlapping sites of left female breast ([place]) 01/28/2013  . Anemia in neoplastic disease 01/13/2010  . ANXIETY, SITUATIONAL 04/07/2009  . Hypertension 04/07/2009  . Recurrent breast cancer, left ([place]) 01/13/2008  . Coronary atherosclerosis 12/31/2006  . Osteoporosis 12/31/2006  . Paget's disease of vulva  11/14/2006    Past Surgical History:  Procedure Laterality Date  . ABDOMINAL HYSTERECTOMY    . BREAST LUMPECTOMY    . excision for Paget's vulvar    . FLEXIBLE SIGMOIDOSCOPY N/A 06/27/2012   Procedure: FLEXIBLE SIGMOIDOSCOPY;  Surgeon: Lafayette Dragon, MD;  Location: [facility];  Service: Endoscopy;  Laterality: N/A;  . MASTECTOMY     left  . vertbroplasty     T12    OB History    No data available       Home Medications    Prior to Admission medications   Medication Sig Start Date End Date Taking? Authorizing Provider  acetaminophen (TYLENOL) 325 MG tablet Take 650 mg by mouth every 6 (six) hours as needed for pain.    [provider]  cephALEXin (KEFLEX) 500 MG capsule Take 1 capsule by mouth every 6 (six) hours. 10 day course starting 04/10/17 04/09/17   [provider]  citalopram (CELEXA) 20 MG tablet Take 1 tablet by mouth daily. 03/24/17   [provider]  divalproex (DEPAKOTE SPRINKLE) 125 MG capsule Take 250 mg by mouth 2 (two) times daily. 01/25/17   [provider]  LORazepam (ATIVAN) 2 MG/ML concentrated solution Take 2 mg by mouth daily as needed for agitation. 01/29/17   [provider]  risperiDONE (RISPERDAL) 1 MG tablet Take 1 tablet by mouth 2 (two) times daily. 04/03/17  [provider]  Skin Protectants, Misc. (EUCERIN) cream Apply 1 application topically 2 (two) times daily. 01/25/17 01/25/18  [provider]  traZODone (DESYREL) 50 MG tablet Take 1 tablet by mouth at bedtime as needed. 01/25/17   [provider]    Family History Family History  Problem Relation Age of Onset  . Hypertension Mother   . Cancer Sister     Social History Social History   Tobacco Use  . Smoking status: Former Smoker    Last attempt to quit: 05/01/1990    Years since quitting: 26.9  . Smokeless tobacco: Never Used  Substance Use Topics  . Alcohol use: No  . Drug use: No     Allergies   Solifenacin  succinate   Review of Systems Review of Systems  Unable to perform ROS: Dementia     Physical Exam Updated Vital Signs BP (!) 168/81 (BP Location: Right Arm)   Pulse 64   Temp 98.5 F (36.9 C) (Oral)   Resp 16   SpO2 93%   Physical Exam  Constitutional: She appears well-developed and well-nourished.  HENT:  Head: Normocephalic and atraumatic. Head is without raccoon's eyes and without Battle's sign.  Mouth/Throat: Oropharynx is clear and moist and mucous membranes are normal.  No tenderness to palpation of skull. No deformities or crepitus noted. No open wounds, abrasions or lacerations.   Eyes: Conjunctivae, EOM and lids are normal. Pupils are equal, round, and reactive to light.  Neck:  Unable to assess neck secondary to patient's inability to follow command  Cardiovascular: Normal rate, regular rhythm, normal heart sounds and normal pulses. Exam reveals no gallop and no friction rub.  No murmur heard. Pulses:      Radial pulses are 2+ on the right side, and 2+ on the left side.       Dorsalis pedis pulses are 2+ on the right side, and 2+ on the left side.  Pulmonary/Chest: Effort normal and breath sounds normal.  Musculoskeletal: Normal range of motion.       Thoracic back: She exhibits no tenderness.       Lumbar back: She exhibits no tenderness.  Lymphedema noted to left upper extremity. No pelvic instability. No tenderness to palpation to bilateral knees and ankles. No deformities or crepitus noted. FROM of BLE without any difficulty.   Neurological: She is alert.  Alert and oriented to person.  Per nursing home staff, this is patient's baseline. Unable to assess neuro function secondary to patient's inability to tolerate following commands.   Skin: Skin is warm and dry. Capillary refill takes less than 2 seconds.  Psychiatric: She has a normal mood and affect. Her speech is normal.  Nursing note and vitals reviewed.    ED Treatments / Results  Labs (all labs  ordered are listed, but only abnormal results are displayed) Labs Reviewed - No data to display  EKG  EKG Interpretation None       Radiology Ct Head Wo Contrast  Result Date: 04/17/2017 CLINICAL DATA:  Fall, headache EXAM: CT HEAD WITHOUT CONTRAST CT CERVICAL SPINE WITHOUT CONTRAST TECHNIQUE: Multidetector CT imaging of the head and cervical spine was performed following the standard protocol without intravenous contrast. Multiplanar CT image reconstructions of the cervical spine were also generated. COMPARISON:  04/13/2017 FINDINGS: CT HEAD FINDINGS Brain: There is atrophy and chronic small vessel disease changes. No acute intracranial abnormality. Specifically, no hemorrhage, hydrocephalus, mass lesion, acute infarction, or significant intracranial injury. Vascular: No hyperdense vessel or  unexpected calcification. Skull: No acute calvarial abnormality. Sinuses/Orbits: No acute finding. Other: None CT CERVICAL SPINE FINDINGS Alignment: Mild anterolisthesis of C2 on C3 and C3 on C4 and C7 on T1 related to facet disease. Skull base and vertebrae: No fracture Soft tissues and spinal canal: Severe diffuse degenerative disc and facet disease. Disc levels: Prevertebral soft tissues are normal. No epidural or paraspinal hematoma. Upper chest: Mild emphysema.  No acute findings. Other: No acute findings. IMPRESSION: Severe diffuse cerebral atrophy and chronic small vessel disease. No acute intracranial abnormality. Severe diffuse degenerative disc and facet disease in the cervical spine. No acute bony abnormality. Electronically Signed   By: Rolm Baptise M.D.   On: 04/17/2017 12:54   Ct Cervical Spine Wo Contrast  Result Date: 04/17/2017 CLINICAL DATA:  Fall, headache EXAM: CT HEAD WITHOUT CONTRAST CT CERVICAL SPINE WITHOUT CONTRAST TECHNIQUE: Multidetector CT imaging of the head and cervical spine was performed following the standard protocol without intravenous contrast. Multiplanar CT image  reconstructions of the cervical spine were also generated. COMPARISON:  04/13/2017 FINDINGS: CT HEAD FINDINGS Brain: There is atrophy and chronic small vessel disease changes. No acute intracranial abnormality. Specifically, no hemorrhage, hydrocephalus, mass lesion, acute infarction, or significant intracranial injury. Vascular: No hyperdense vessel or unexpected calcification. Skull: No acute calvarial abnormality. Sinuses/Orbits: No acute finding. Other: None CT CERVICAL SPINE FINDINGS Alignment: Mild anterolisthesis of C2 on C3 and C3 on C4 and C7 on T1 related to facet disease. Skull base and vertebrae: No fracture Soft tissues and spinal canal: Severe diffuse degenerative disc and facet disease. Disc levels: Prevertebral soft tissues are normal. No epidural or paraspinal hematoma. Upper chest: Mild emphysema.  No acute findings. Other: No acute findings. IMPRESSION: Severe diffuse cerebral atrophy and chronic small vessel disease. No acute intracranial abnormality. Severe diffuse degenerative disc and facet disease in the cervical spine. No acute bony abnormality. Electronically Signed   By: Rolm Baptise M.D.   On: 04/17/2017 12:54    Procedures Procedures (including critical care time)  Medications Ordered in ED Medications - No data to display   Initial Impression / Assessment and Plan / ED Course  I have reviewed the triage vital signs and the nursing notes.  Pertinent labs & imaging results that were available during my care of the patient were reviewed by me and considered in my medical decision making (see chart for details).     83 y.o. F past medical history of dementia who presents from [facility] with nursing home for evaluation of fall.  Per EMS, patient was getting up from her wheelchair and leaning too far forward, causing her front frontal forehead to hit the wall.  The entire episode was witnessed.  Nursing home staff denies any LOC.  Patient was immediately placed back into her  wheelchair.  She has been at baseline since the incident.  Patient is not on blood thinners.  The nursing home reported evidence of erythema noted to the scalp.  On my evaluation there is no head.  Given concerns of hitting patient's head, will plan for imaging.  Since patient did not have a fall to the ground, no other imaging indicated at this time. Discussed patient with Dr. Vanita Panda who independently evaluated patient and agrees with plan.  CT head and CT C-spine reviewed.  Negative for any acute abnormality.  Son is at bedside.  Discussed with son.  He states that patient is at her baseline.  Patient is stable for discharge at this time.  Will  consult [facility] for transport.   Final Clinical Impressions(s) / ED Diagnoses   Final diagnoses:  Fall, initial encounter    ED Discharge Orders    None       Desma Mcgregor 04/17/17 1649    Carmin Muskrat, MD 04/19/17 2126

## 2017-04-17 NOTE — ED Notes (Signed)
[facility] ED Provider at bedside.

## 2017-04-17 NOTE — ED Triage Notes (Signed)
Patient brought in by [facility] from [facility].  Patient was trying to get out of her wheelchair. SNF staff was around her to try to catch her but patient did hit head on wall and erythema on top of head per SNF to EMS. Patient has swelling to left arm as she did on previous visits.  Patient has soft collar on and in place. Vitals: 138/77, 96%, 16R, HR60-80

## 2017-04-17 NOTE — ED Notes (Signed)
[facility] made aware of transport back to [facility]

## 2017-09-11 ENCOUNTER — Other Ambulatory Visit: Payer: Self-pay | Admitting: Oncology

## 2017-09-28 DEATH — deceased

## 2018-03-26 IMAGING — CT CT HEAD W/O CM
4 series · 16 of 47 positions shown, 18 images · non-contrast
Comparison: 07/17/2007

CLINICAL DATA: Fall and hit right side of head nausea and dizziness

EXAM:
CT HEAD WITHOUT CONTRAST
TECHNIQUE: Contiguous axial images were obtained from the base of the skull
through the vertex without intravenous contrast.

[Series 2: head without · axial · non-contrast · 0.40mm/px · z∈[+1269,+1384]mm · 7 of 31 slices shown, 9 images]
[im 4/31  brain]
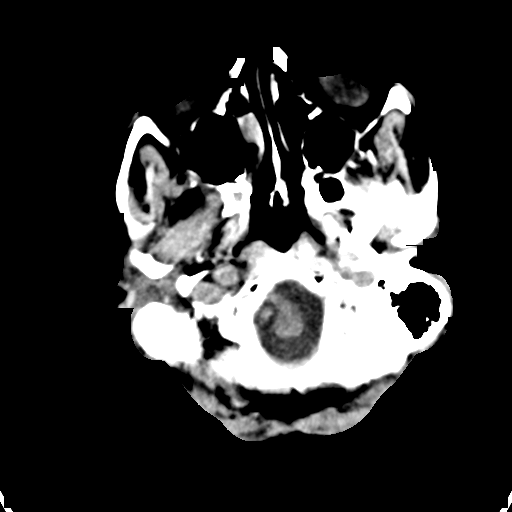
[im 4/31  bone]
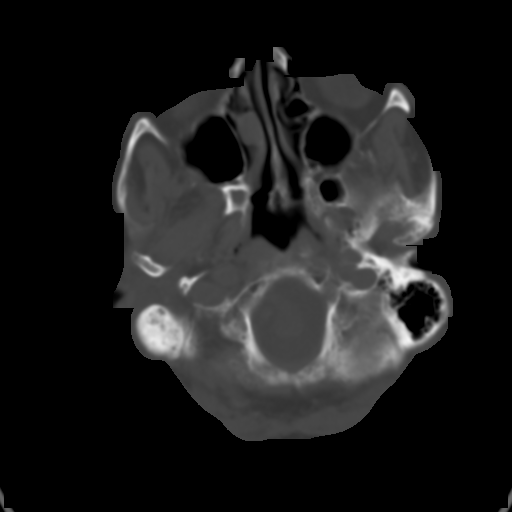
[im 8/31  brain]
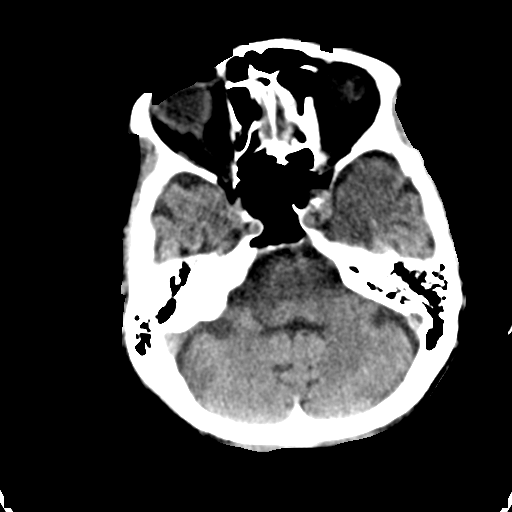
[im 12/31  brain]
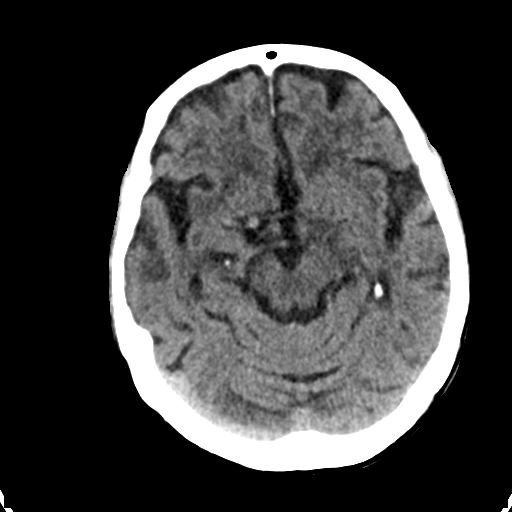
[im 16/31  brain]
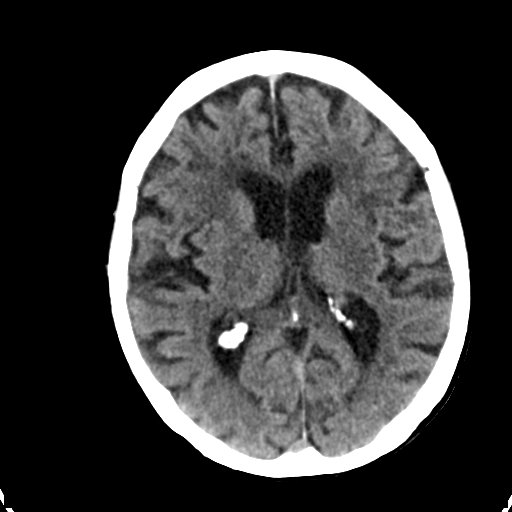
[im 19/31  brain]
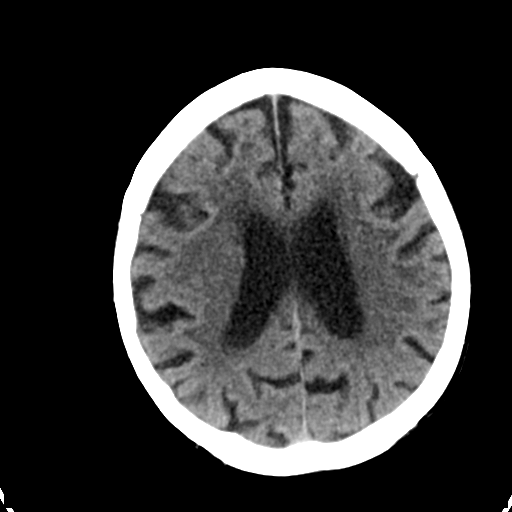
[im 19/31  bone]
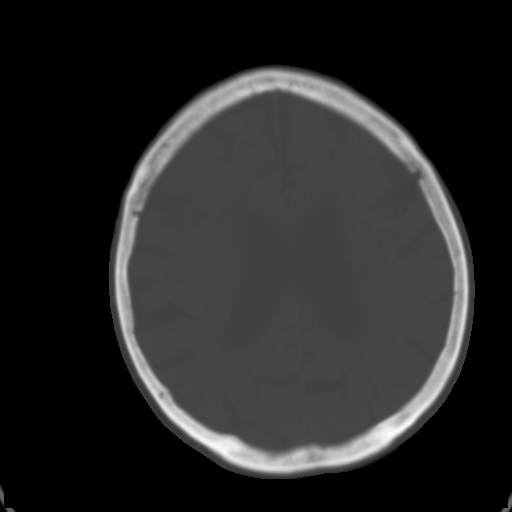
[im 23/31  brain]
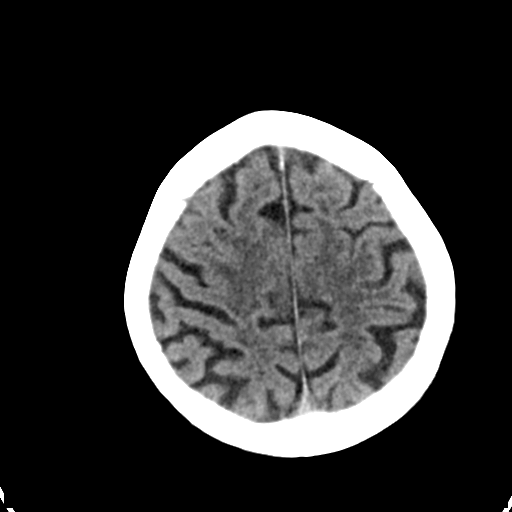
[im 27/31  brain]
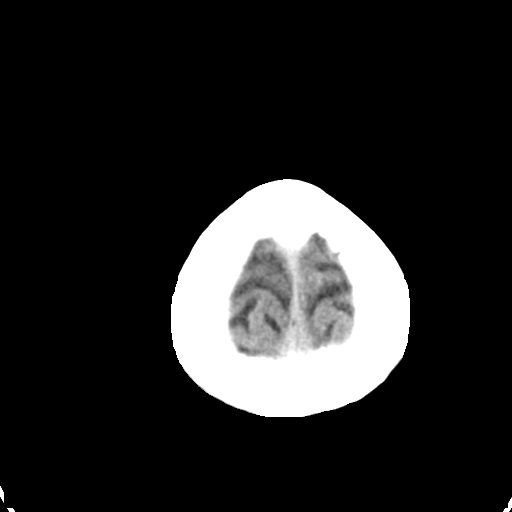

[Series 3: head bone · axial · 0.40mm/px · z∈[+1268,+1298]mm · 3 of 77 slices shown]
[im 8/77  bone]
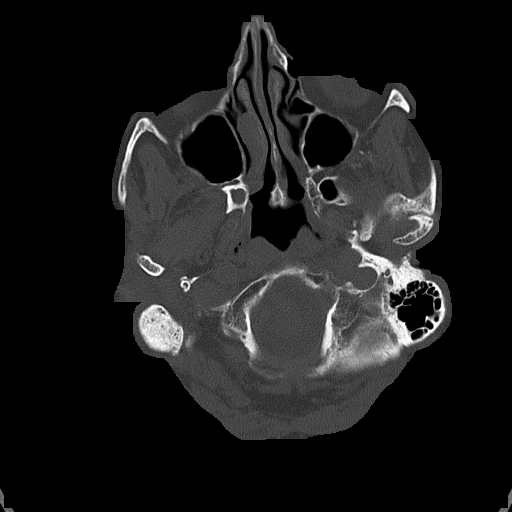
[im 16/77  bone]
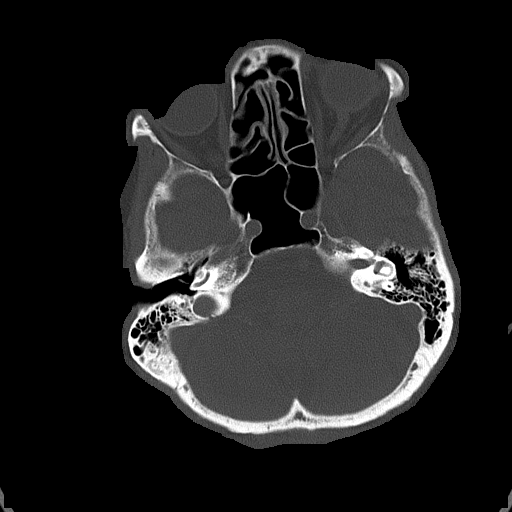
[im 23/77  bone]
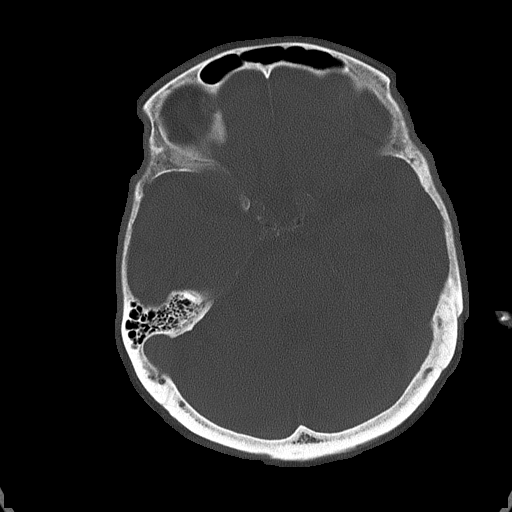

[Series 4: head without cor · coronal · non-contrast · 0.31mm/px · 3 of 61 slices shown]
[im 21/61  brain]
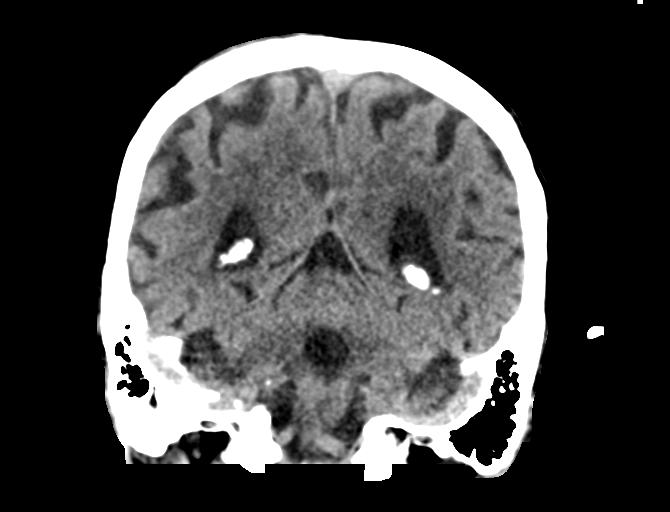
[im 27/61  brain]
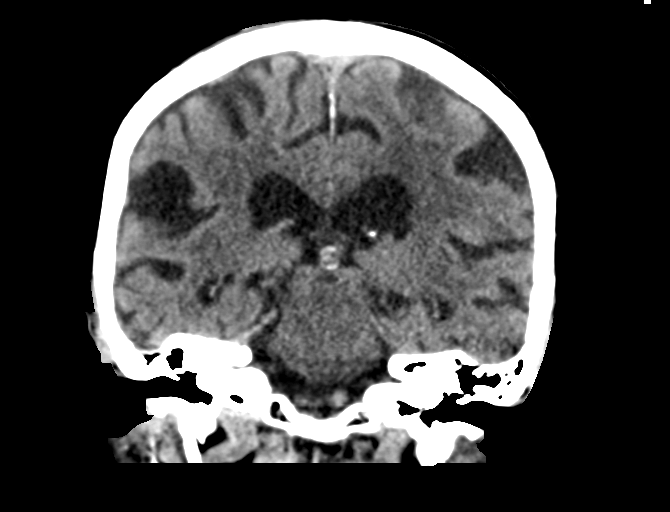
[im 34/61  brain]
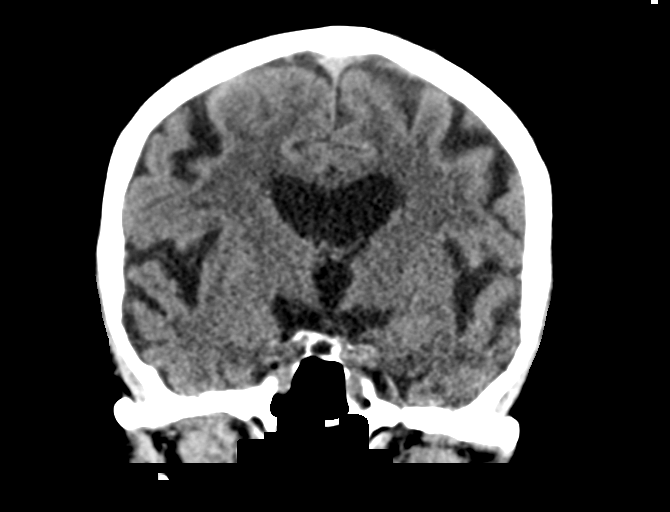

[Series 5: head without sag · sagittal · non-contrast · 0.31mm/px · 3 of 54 slices shown]
[im 18/54  brain]
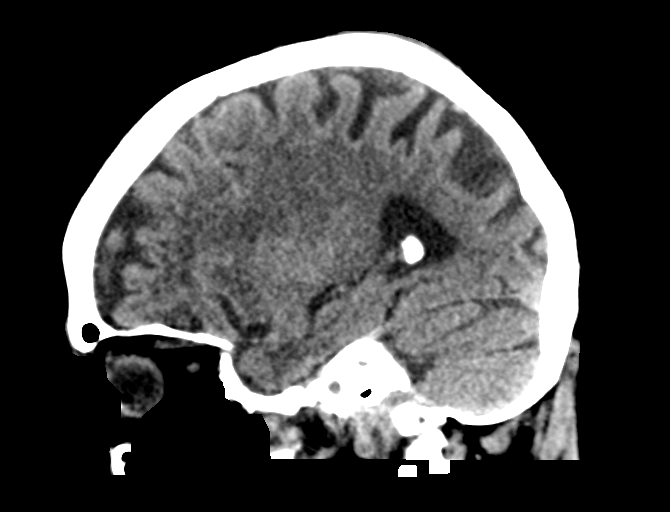
[im 27/54  brain]
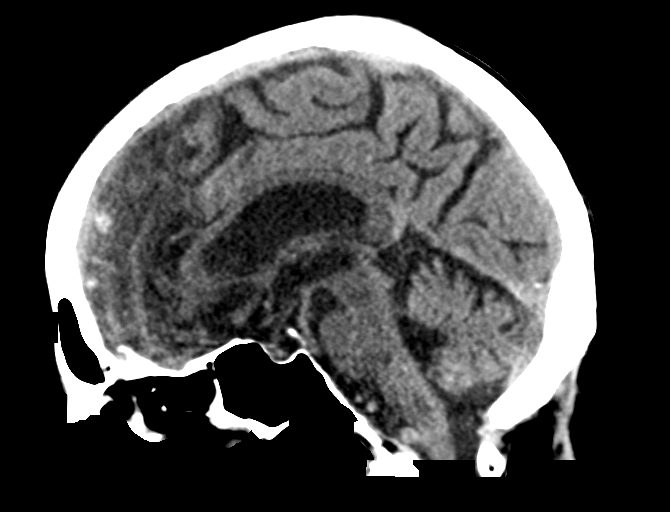
[im 36/54  brain]
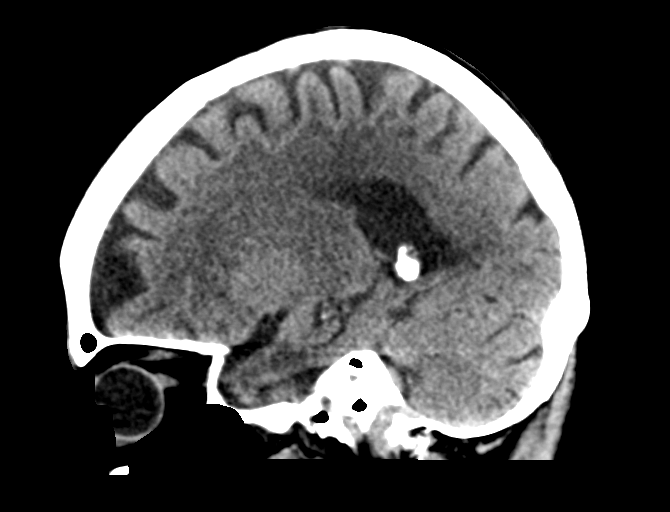

[16 of 47 positions shown; findings below may reference images not displayed]

FINDINGS: Brain: No acute territorial infarction, intracranial hemorrhage or
focal mass lesion is visualized. Mild to moderate atrophy. Moderate
periventricular, subcortical and deep white matter small vessel
ischemic changes. Ventricles appropriate given degree of atrophy.

Vascular: Atherosclerotic calcifications within the carotid
artery's. No hyperdense vessels.

Skull: No fracture.  No suspicious bone lesion.

Sinuses/Orbits: No acute orbital abnormality. Mucosal thickening in
the ethmoid sinuses.

Other: Small right posterior parietal scalp swelling
IMPRESSION: No CT evidence for acute intracranial abnormality. Atrophy and
bilateral white matter small vessel ischemic changes

## 2018-05-30 ENCOUNTER — Encounter: Payer: Self-pay | Admitting: Oncology

## 2018-05-30 ENCOUNTER — Other Ambulatory Visit: Payer: Self-pay | Admitting: Nurse Practitioner
